# Patient Record
Sex: Female | Born: 1935 | Race: White | Hispanic: No | Smoking: Never smoker
Health system: Southern US, Community
[De-identification: ages and names within clinical notes are randomized; demographics above are authoritative.]

## PROBLEM LIST (undated history)

## (undated) DIAGNOSIS — Z8719 Personal history of other diseases of the digestive system: Secondary | ICD-10-CM

## (undated) DIAGNOSIS — K047 Periapical abscess without sinus: Secondary | ICD-10-CM

## (undated) DIAGNOSIS — I10 Essential (primary) hypertension: Secondary | ICD-10-CM

## (undated) DIAGNOSIS — M810 Age-related osteoporosis without current pathological fracture: Secondary | ICD-10-CM

## (undated) DIAGNOSIS — F419 Anxiety disorder, unspecified: Secondary | ICD-10-CM

## (undated) DIAGNOSIS — F5104 Psychophysiologic insomnia: Secondary | ICD-10-CM

## (undated) DIAGNOSIS — E785 Hyperlipidemia, unspecified: Secondary | ICD-10-CM

## (undated) DIAGNOSIS — M1990 Unspecified osteoarthritis, unspecified site: Secondary | ICD-10-CM

## (undated) DIAGNOSIS — E119 Type 2 diabetes mellitus without complications: Secondary | ICD-10-CM

## (undated) DIAGNOSIS — Z87442 Personal history of urinary calculi: Secondary | ICD-10-CM

## (undated) DIAGNOSIS — R32 Unspecified urinary incontinence: Secondary | ICD-10-CM

## (undated) HISTORY — DX: Essential (primary) hypertension: I10

## (undated) HISTORY — DX: Unspecified urinary incontinence: R32

## (undated) HISTORY — DX: Hyperlipidemia, unspecified: E78.5

## (undated) HISTORY — PX: CYSTOSCOPY: SUR368

## (undated) HISTORY — PX: CHOLECYSTECTOMY: SHX55

## (undated) HISTORY — DX: Anxiety disorder, unspecified: F41.9

## (undated) HISTORY — DX: Psychophysiologic insomnia: F51.04

## (undated) HISTORY — DX: Personal history of other diseases of the digestive system: Z87.19

## (undated) HISTORY — DX: Type 2 diabetes mellitus without complications: E11.9

## (undated) HISTORY — DX: Unspecified osteoarthritis, unspecified site: M19.90

---

## 1999-02-12 ENCOUNTER — Encounter: Payer: Self-pay | Admitting: Neurosurgery

## 1999-02-12 ENCOUNTER — Ambulatory Visit (HOSPITAL_COMMUNITY): Admission: RE | Admit: 1999-02-12 | Discharge: 1999-02-12 | Payer: Self-pay | Admitting: Neurosurgery

## 1999-02-26 ENCOUNTER — Encounter: Payer: Self-pay | Admitting: Neurosurgery

## 1999-02-26 ENCOUNTER — Ambulatory Visit (HOSPITAL_COMMUNITY): Admission: RE | Admit: 1999-02-26 | Discharge: 1999-02-26 | Payer: Self-pay | Admitting: Neurosurgery

## 1999-03-12 ENCOUNTER — Encounter: Payer: Self-pay | Admitting: Neurosurgery

## 1999-03-12 ENCOUNTER — Ambulatory Visit (HOSPITAL_COMMUNITY): Admission: RE | Admit: 1999-03-12 | Discharge: 1999-03-12 | Payer: Self-pay | Admitting: Neurosurgery

## 1999-03-28 ENCOUNTER — Ambulatory Visit (HOSPITAL_COMMUNITY): Admission: RE | Admit: 1999-03-28 | Discharge: 1999-03-28 | Payer: Self-pay | Admitting: Neurosurgery

## 1999-03-28 ENCOUNTER — Encounter: Payer: Self-pay | Admitting: Neurosurgery

## 2002-10-20 ENCOUNTER — Encounter: Payer: Self-pay | Admitting: Neurosurgery

## 2002-10-20 ENCOUNTER — Encounter: Admission: RE | Admit: 2002-10-20 | Discharge: 2002-10-20 | Payer: Self-pay | Admitting: Neurosurgery

## 2002-11-03 ENCOUNTER — Encounter: Admission: RE | Admit: 2002-11-03 | Discharge: 2002-11-03 | Payer: Self-pay | Admitting: Neurosurgery

## 2002-11-03 ENCOUNTER — Encounter: Payer: Self-pay | Admitting: Neurosurgery

## 2002-11-17 ENCOUNTER — Encounter: Payer: Self-pay | Admitting: Neurosurgery

## 2002-11-17 ENCOUNTER — Encounter: Admission: RE | Admit: 2002-11-17 | Discharge: 2002-11-17 | Payer: Self-pay | Admitting: Neurosurgery

## 2010-03-30 HISTORY — PX: KNEE SURGERY: SHX244

## 2010-04-09 ENCOUNTER — Ambulatory Visit: Admission: RE | Admit: 2010-04-09 | Discharge: 2010-04-09 | Payer: Self-pay | Admitting: Orthopedic Surgery

## 2010-04-09 ENCOUNTER — Encounter (INDEPENDENT_AMBULATORY_CARE_PROVIDER_SITE_OTHER): Payer: Self-pay | Admitting: Orthopedic Surgery

## 2010-04-09 ENCOUNTER — Ambulatory Visit: Payer: Self-pay | Admitting: Vascular Surgery

## 2010-04-16 ENCOUNTER — Inpatient Hospital Stay (HOSPITAL_COMMUNITY): Admission: EM | Admit: 2010-04-16 | Discharge: 2010-04-20 | Payer: Self-pay | Admitting: Emergency Medicine

## 2010-04-16 ENCOUNTER — Encounter (INDEPENDENT_AMBULATORY_CARE_PROVIDER_SITE_OTHER): Payer: Self-pay | Admitting: Emergency Medicine

## 2010-12-06 ENCOUNTER — Ambulatory Visit
Admission: RE | Admit: 2010-12-06 | Discharge: 2010-12-06 | Payer: Self-pay | Source: Home / Self Care | Admitting: Orthopedic Surgery

## 2010-12-06 ENCOUNTER — Encounter (INDEPENDENT_AMBULATORY_CARE_PROVIDER_SITE_OTHER): Payer: Self-pay | Admitting: Orthopedic Surgery

## 2011-03-19 LAB — CBC
HCT: 31.9 % — ABNORMAL LOW (ref 36.0–46.0)
HCT: 34.2 % — ABNORMAL LOW (ref 36.0–46.0)
Hemoglobin: 11.1 g/dL — ABNORMAL LOW (ref 12.0–15.0)
Hemoglobin: 12 g/dL (ref 12.0–15.0)
MCHC: 35.1 g/dL (ref 30.0–36.0)
MCV: 91.3 fL (ref 78.0–100.0)
RBC: 3.49 MIL/uL — ABNORMAL LOW (ref 3.87–5.11)
RDW: 12.7 % (ref 11.5–15.5)
WBC: 9.3 10*3/uL (ref 4.0–10.5)

## 2011-03-19 LAB — URINALYSIS, ROUTINE W REFLEX MICROSCOPIC
Bilirubin Urine: NEGATIVE
Nitrite: NEGATIVE
Specific Gravity, Urine: 1.004 — ABNORMAL LOW (ref 1.005–1.030)
Urobilinogen, UA: 0.2 mg/dL (ref 0.0–1.0)
pH: 6.5 (ref 5.0–8.0)

## 2011-03-19 LAB — COMPREHENSIVE METABOLIC PANEL
ALT: 73 U/L — ABNORMAL HIGH (ref 0–35)
CO2: 29 mEq/L (ref 19–32)
Calcium: 9.4 mg/dL (ref 8.4–10.5)
Chloride: 103 mEq/L (ref 96–112)
Creatinine, Ser: 0.67 mg/dL (ref 0.4–1.2)
GFR calc non Af Amer: >60 mL/min (ref >60)
Glucose, Bld: 113 mg/dL — ABNORMAL HIGH (ref 70–99)
Sodium: 138 mEq/L (ref 135–145)
Total Bilirubin: 0.6 mg/dL (ref 0.3–1.2)

## 2011-03-19 LAB — GLUCOSE, CAPILLARY
Glucose-Capillary: 119 mg/dL — ABNORMAL HIGH (ref 70–99)
Glucose-Capillary: 142 mg/dL — ABNORMAL HIGH (ref 70–99)

## 2011-03-19 LAB — URINE CULTURE: Colony Count: 50000

## 2011-03-19 LAB — DIFFERENTIAL
Basophils Absolute: 0 10*3/uL (ref 0.0–0.1)
Basophils Relative: 0 % (ref 0–1)
Eosinophils Relative: 1 % (ref 0–5)
Monocytes Absolute: 0.5 10*3/uL (ref 0.1–1.0)

## 2011-03-19 LAB — CULTURE, BLOOD (ROUTINE X 2)

## 2011-03-19 LAB — PROTIME-INR
INR: 2.08 — ABNORMAL HIGH (ref 0.00–1.49)
INR: 2.23 — ABNORMAL HIGH (ref 0.00–1.49)
INR: 2.61 — ABNORMAL HIGH (ref 0.00–1.49)
Prothrombin Time: 20.9 seconds — ABNORMAL HIGH (ref 11.6–15.2)
Prothrombin Time: 24.5 seconds — ABNORMAL HIGH (ref 11.6–15.2)
Prothrombin Time: 27.7 seconds — ABNORMAL HIGH (ref 11.6–15.2)

## 2013-04-27 ENCOUNTER — Ambulatory Visit (INDEPENDENT_AMBULATORY_CARE_PROVIDER_SITE_OTHER): Payer: Medicare Other | Admitting: Diagnostic Neuroimaging

## 2013-04-27 ENCOUNTER — Encounter: Payer: Self-pay | Admitting: Diagnostic Neuroimaging

## 2013-04-27 VITALS — BP 135/69 | HR 58 | Temp 97.5°F | Ht 64.5 in | Wt 145.0 lb

## 2013-04-27 DIAGNOSIS — G20 Parkinson's disease: Secondary | ICD-10-CM

## 2013-04-27 DIAGNOSIS — R251 Tremor, unspecified: Secondary | ICD-10-CM

## 2013-04-27 DIAGNOSIS — R259 Unspecified abnormal involuntary movements: Secondary | ICD-10-CM

## 2013-04-27 MED ORDER — CARBIDOPA-LEVODOPA 25-100 MG PO TABS: 1.0000 | ORAL_TABLET | Freq: Three times a day (TID) | ORAL | Status: DC

## 2013-04-27 NOTE — Patient Instructions (Addendum)
Start carbidopa/levodopa (25/100) half tab three times per day x 2 weeks; then increase to 1 tab 3 times per day (30 minutes before meals).  Consider physical therapy (outpatient or home PT).

## 2013-04-27 NOTE — Progress Notes (Signed)
[facility]  PATIENT: Kim Nichols DOB: Jul 20, 1929  REFERRING CLINICIAN: Arther Dames HISTORY FROM: patient  REASON FOR VISIT: new consult   HISTORICAL  CHIEF COMPLAINT:  Chief Complaint  Patient presents with  . Tremors    NP...6     HISTORY OF PRESENT ILLNESS:   79 year old right-handed female here for evaluation of tremor. Patient reports resting greater than postural tremor, mainly in her right upper extremity. Symptoms worse anxiety and stress. Jos is 1-2 year history of balance difficulty. She has noticed some decreased in taste ability over the years. No sleep disturbances, hallucination, constipation or nausea. She has had some head shaking over the past 3 months. No family history tremor.  REVIEW OF SYSTEMS: Full 14 system review of systems performed and notable only for tremor joint pain ringing in ears.  ALLERGIES: Allergies  Allergen Reactions  . Aleve (Naproxen Sodium) Itching    HOME MEDICATIONS: No outpatient prescriptions prior to visit.   No facility-administered medications prior to visit.    PAST MEDICAL HISTORY: Past Medical History  Diagnosis Date  . Hypertension   . Diabetes mellitus without complication   . Dyslipidemia   . History of gastroesophageal reflux (GERD)   . Osteoarthritis   . Anxiety disorder   . Chronic insomnia   . Incontinence of urine     PAST SURGICAL HISTORY: Past Surgical History  Procedure Laterality Date  . Knee surgery  4-11    FAMILY HISTORY: No family history on file.  SOCIAL HISTORY:  History   Social History  . Marital Status: Widowed    Spouse Name: N/A    Number of Children: N/A  . Years of Education: N/A   Occupational History  . Not on file.   Social History Main Topics  . Smoking status: Never Smoker   . Smokeless tobacco: Never Used  . Alcohol Use: No  . Drug Use: No  . Sexually Active: Not on file   Other Topics Concern  . Not on file   Social History Narrative    . No narrative on file     PHYSICAL EXAM  Filed Vitals:   04/27/13 1022  BP: 135/69  Pulse: 58  Temp: 97.5 F (36.4 C)  TempSrc: Oral  Height: 5' 4.5" (1.638 m)  Weight: 145 lb (65.772 kg)   Body mass index is 24.51 kg/(m^2).  GENERAL EXAM: Patient is in no distress  CARDIOVASCULAR: Regular rate and rhythm, no murmurs, no carotid bruits  NEUROLOGIC: MENTAL STATUS: awake, alert, language fluent, comprehension intact, naming intact; NO FRONTAL RELEASE SIGNS. CRANIAL NERVE: no papilledema on fundoscopic exam, pupils equal and reactive to light, visual fields full to confrontation, extraocular muscles intact, no nystagmus, facial sensation and strength symmetric, uvula midline, shoulder shrug symmetric, tongue midline; MILD HEAD AND MOUTH TREMOR. MOTOR: MILD COGWHEELING IN L > R UE. INT REST TREMOR OF RUE. MILD BRADYKINESIA IN BUE. Full strength in the BUE, BLE; MILD POSTURAL TREMOR OF BUE. SENSORY: VIB 7 SECONDS IN TOES COORDINATION: finger-nose-finger, fine finger movements MILD ACTION TREMOR. REFLEXES: deep tendon reflexes present and symmetric GAIT/STATION: RIGHT LEG DEVIATES MEDIALLY AT KNEE, WITH LIMPING GAIT. STOOPED POSTURE.   DIAGNOSTIC DATA (LABS, IMAGING, TESTING) - I reviewed patient records, labs, notes, testing and imaging myself where available.  Lab Results  Component Value Date   WBC 9.3 04/17/2010   HGB 11.1* 04/17/2010   HCT 31.9* 04/17/2010   MCV 91.3 04/17/2010   PLT 235 04/17/2010      Component  Value Date/Time   NA 138 04/16/2010 1359   K 4.5 04/16/2010 1359   CL 103 04/16/2010 1359   CO2 29 04/16/2010 1359   GLUCOSE 113* 04/16/2010 1359   BUN 8 04/16/2010 1359   CREATININE 0.67 04/16/2010 1359   CALCIUM 9.4 04/16/2010 1359   PROT 7.2 04/16/2010 1359   ALBUMIN 3.9 04/16/2010 1359   AST 70* 04/16/2010 1359   ALT 73* 04/16/2010 1359   ALKPHOS 60 04/16/2010 1359   BILITOT 0.6 04/16/2010 1359   GFRNONAA >60 04/16/2010 1359   GFRAA  Value: >60        The eGFR  has been calculated using the MDRD equation. This calculation has not been validated in all clinical situations. eGFR's persistently <60 mL/min signify possible Chronic Kidney Disease. 04/16/2010 1359   No results found for this basename: CHOL, HDL, LDLCALC, LDLDIRECT, TRIG, CHOLHDL   No results found for this basename: HGBA1C   No results found for this basename: VITAMINB12   No results found for this basename: TSH      ASSESSMENT AND PLAN  79 y.o. year old female  has a past medical history of Hypertension; Diabetes mellitus without complication; Dyslipidemia; History of gastroesophageal reflux (GERD); Osteoarthritis; Anxiety disorder; Chronic insomnia; and Incontinence of urine. here with progressive resting greater than postural tremor. Patient has some mild cogwheel rigidity, bradykinesia and postural instability. Suspect idiopathic Parkinson's disease. We'll check MRI brain, TSH, as well as start carbidopa/levodopa trial.   Orders Placed This Encounter  Procedures  . MR Brain Wo Contrast  . TSH    Suanne Marker, MD 04/27/2013, 11:41 AM Certified in Neurology, Neurophysiology and Neuroimaging  [facility] [address] [place], [place] [address] [phone]

## 2013-05-03 NOTE — Progress Notes (Signed)
Quick Note:  Left a message on the pt's home voice mail (vm was in the patient's voice and she stated her name) regarding her recent labs being within normal limits. Contact information was given so that she may call with any questions or concerns.   ______ 

## 2013-05-07 ENCOUNTER — Ambulatory Visit
Admission: RE | Admit: 2013-05-07 | Discharge: 2013-05-07 | Disposition: A | Discharge status: Home / Self Care | Payer: Medicare Other | Source: Ambulatory Visit | Attending: Diagnostic Neuroimaging | Admitting: Diagnostic Neuroimaging

## 2013-05-07 DIAGNOSIS — R251 Tremor, unspecified: Secondary | ICD-10-CM

## 2013-05-07 DIAGNOSIS — R259 Unspecified abnormal involuntary movements: Secondary | ICD-10-CM

## 2013-05-07 DIAGNOSIS — G20 Parkinson's disease: Secondary | ICD-10-CM

## 2013-05-30 HISTORY — PX: OTHER SURGICAL HISTORY: SHX169

## 2013-06-21 ENCOUNTER — Inpatient Hospital Stay (HOSPITAL_COMMUNITY)
Admission: AD | Admit: 2013-06-21 | Discharge: 2013-06-25 | DRG: 070 | Disposition: A | Discharge status: Rehabilitation Facility | Payer: Medicare Other | Source: Other Acute Inpatient Hospital | Attending: Internal Medicine | Admitting: Internal Medicine

## 2013-06-21 ENCOUNTER — Inpatient Hospital Stay
Admission: AD | Admit: 2013-06-21 | Payer: Self-pay | Source: Other Acute Inpatient Hospital | Admitting: Internal Medicine

## 2013-06-21 ENCOUNTER — Inpatient Hospital Stay (HOSPITAL_COMMUNITY): Payer: Medicare Other

## 2013-06-21 ENCOUNTER — Encounter (HOSPITAL_COMMUNITY): Payer: Self-pay | Admitting: Internal Medicine

## 2013-06-21 DIAGNOSIS — E785 Hyperlipidemia, unspecified: Secondary | ICD-10-CM | POA: Diagnosis present

## 2013-06-21 DIAGNOSIS — G20A1 Parkinson's disease without dyskinesia, without mention of fluctuations: Secondary | ICD-10-CM | POA: Diagnosis present

## 2013-06-21 DIAGNOSIS — I82409 Acute embolism and thrombosis of unspecified deep veins of unspecified lower extremity: Secondary | ICD-10-CM | POA: Diagnosis not present

## 2013-06-21 DIAGNOSIS — D62 Acute posthemorrhagic anemia: Secondary | ICD-10-CM | POA: Diagnosis present

## 2013-06-21 DIAGNOSIS — F329 Major depressive disorder, single episode, unspecified: Secondary | ICD-10-CM | POA: Diagnosis present

## 2013-06-21 DIAGNOSIS — G20 Parkinson's disease: Secondary | ICD-10-CM | POA: Diagnosis present

## 2013-06-21 DIAGNOSIS — G9340 Encephalopathy, unspecified: Principal | ICD-10-CM | POA: Diagnosis present

## 2013-06-21 DIAGNOSIS — Z79899 Other long term (current) drug therapy: Secondary | ICD-10-CM

## 2013-06-21 DIAGNOSIS — R410 Disorientation, unspecified: Secondary | ICD-10-CM

## 2013-06-21 DIAGNOSIS — B0229 Other postherpetic nervous system involvement: Secondary | ICD-10-CM | POA: Diagnosis present

## 2013-06-21 DIAGNOSIS — D649 Anemia, unspecified: Secondary | ICD-10-CM

## 2013-06-21 DIAGNOSIS — R404 Transient alteration of awareness: Secondary | ICD-10-CM

## 2013-06-21 DIAGNOSIS — M1990 Unspecified osteoarthritis, unspecified site: Secondary | ICD-10-CM | POA: Diagnosis present

## 2013-06-21 DIAGNOSIS — E119 Type 2 diabetes mellitus without complications: Secondary | ICD-10-CM | POA: Diagnosis present

## 2013-06-21 DIAGNOSIS — J189 Pneumonia, unspecified organism: Secondary | ICD-10-CM

## 2013-06-21 DIAGNOSIS — S72009D Fracture of unspecified part of neck of unspecified femur, subsequent encounter for closed fracture with routine healing: Secondary | ICD-10-CM

## 2013-06-21 DIAGNOSIS — I82401 Acute embolism and thrombosis of unspecified deep veins of right lower extremity: Secondary | ICD-10-CM

## 2013-06-21 DIAGNOSIS — I10 Essential (primary) hypertension: Secondary | ICD-10-CM | POA: Diagnosis present

## 2013-06-21 DIAGNOSIS — J9600 Acute respiratory failure, unspecified whether with hypoxia or hypercapnia: Secondary | ICD-10-CM | POA: Diagnosis present

## 2013-06-21 DIAGNOSIS — E876 Hypokalemia: Secondary | ICD-10-CM | POA: Diagnosis not present

## 2013-06-21 DIAGNOSIS — F411 Generalized anxiety disorder: Secondary | ICD-10-CM | POA: Diagnosis present

## 2013-06-21 LAB — URINALYSIS, ROUTINE W REFLEX MICROSCOPIC
Bilirubin Urine: NEGATIVE
Nitrite: NEGATIVE
Specific Gravity, Urine: 1.028 (ref 1.005–1.030)
pH: 6 (ref 5.0–8.0)

## 2013-06-21 LAB — RAPID URINE DRUG SCREEN, HOSP PERFORMED
Amphetamines: NOT DETECTED
Barbiturates: NOT DETECTED
Opiates: POSITIVE — AB
Tetrahydrocannabinol: NOT DETECTED

## 2013-06-21 LAB — URINE MICROSCOPIC-ADD ON

## 2013-06-21 LAB — BLOOD GAS, ARTERIAL
Bicarbonate: 30.6 mEq/L — ABNORMAL HIGH (ref 20.0–24.0)
Drawn by: 36527
Patient temperature: 98.6
pH, Arterial: 7.383 (ref 7.350–7.450)

## 2013-06-21 LAB — MRSA PCR SCREENING: MRSA by PCR: NEGATIVE

## 2013-06-21 MED ORDER — ACETAMINOPHEN 650 MG RE SUPP: 650.0000 mg | Freq: Four times a day (QID) | RECTAL | Status: DC | PRN

## 2013-06-21 MED ORDER — PIPERACILLIN-TAZOBACTAM 3.375 G IVPB
3.3750 g | Freq: Three times a day (TID) | INTRAVENOUS | Status: DC
  Administered 2013-06-22 (×2): 3.375 g via INTRAVENOUS
  Filled 2013-06-21 (×4): qty 50

## 2013-06-21 MED ORDER — SODIUM CHLORIDE 0.9 % IJ SOLN
3.0000 mL | Freq: Two times a day (BID) | INTRAMUSCULAR | Status: DC
  Administered 2013-06-22 – 2013-06-25 (×5): 3 mL via INTRAVENOUS

## 2013-06-21 MED ORDER — SODIUM CHLORIDE 0.9 % IV SOLN
INTRAVENOUS | Status: DC
  Administered 2013-06-22: via INTRAVENOUS

## 2013-06-21 MED ORDER — ONDANSETRON HCL 4 MG PO TABS: 4.0000 mg | ORAL_TABLET | Freq: Four times a day (QID) | ORAL | Status: DC | PRN

## 2013-06-21 MED ORDER — DEXMEDETOMIDINE HCL IN NACL 200 MCG/50ML IV SOLN
0.2000 ug/kg/h | INTRAVENOUS | Status: DC
Start: 2013-06-22 — End: 2013-06-22
  Administered 2013-06-22: 0.2 ug/kg/h via INTRAVENOUS
  Administered 2013-06-22: 0.8 ug/kg/h via INTRAVENOUS
  Filled 2013-06-21 (×3): qty 50

## 2013-06-21 MED ORDER — THIAMINE HCL 100 MG/ML IJ SOLN
100.0000 mg | Freq: Every day | INTRAMUSCULAR | Status: DC
  Administered 2013-06-22 – 2013-06-23 (×2): 100 mg via INTRAVENOUS
  Filled 2013-06-21 (×2): qty 1

## 2013-06-21 MED ORDER — HYDRALAZINE HCL 20 MG/ML IJ SOLN
10.0000 mg | INTRAMUSCULAR | Status: DC | PRN
  Administered 2013-06-23: 10 mg via INTRAVENOUS
  Filled 2013-06-21: qty 1

## 2013-06-21 MED ORDER — CARBIDOPA-LEVODOPA 25-100 MG PO TABS
1.0000 | ORAL_TABLET | Freq: Three times a day (TID) | ORAL | Status: DC
  Administered 2013-06-22 – 2013-06-23 (×2): 1 via ORAL
  Filled 2013-06-21 (×7): qty 1

## 2013-06-21 MED ORDER — INSULIN ASPART 100 UNIT/ML ~~LOC~~ SOLN: 0.0000 [IU] | SUBCUTANEOUS | Status: DC

## 2013-06-21 MED ORDER — CLONIDINE HCL 0.1 MG/24HR TD PTWK
0.1000 mg | MEDICATED_PATCH | TRANSDERMAL | Status: DC
  Filled 2013-06-21: qty 1

## 2013-06-21 MED ORDER — INSULIN ASPART 100 UNIT/ML ~~LOC~~ SOLN: 0.0000 [IU] | Freq: Three times a day (TID) | SUBCUTANEOUS | Status: DC

## 2013-06-21 MED ORDER — LORAZEPAM 2 MG/ML IJ SOLN: 0.2500 mg | Freq: Once | INTRAMUSCULAR | Status: DC

## 2013-06-21 MED ORDER — LORAZEPAM 2 MG/ML IJ SOLN
INTRAMUSCULAR | Status: AC
  Administered 2013-06-21: 0.25 mg
  Filled 2013-06-21: qty 1

## 2013-06-21 MED ORDER — ONDANSETRON HCL 4 MG/2ML IJ SOLN: 4.0000 mg | Freq: Four times a day (QID) | INTRAMUSCULAR | Status: DC | PRN

## 2013-06-21 MED ORDER — HALOPERIDOL LACTATE 5 MG/ML IJ SOLN
INTRAMUSCULAR | Status: AC
  Administered 2013-06-21: 5 mg via INTRAVENOUS
  Filled 2013-06-21: qty 2

## 2013-06-21 MED ORDER — HALOPERIDOL LACTATE 5 MG/ML IJ SOLN: 5.0000 mg | Freq: Once | INTRAMUSCULAR | Status: AC

## 2013-06-21 MED ORDER — ACETAMINOPHEN 325 MG PO TABS
650.0000 mg | ORAL_TABLET | Freq: Four times a day (QID) | ORAL | Status: DC | PRN
  Administered 2013-06-22 – 2013-06-24 (×4): 650 mg via ORAL
  Filled 2013-06-21 (×4): qty 2

## 2013-06-21 MED ORDER — VANCOMYCIN HCL 500 MG IV SOLR
500.0000 mg | Freq: Two times a day (BID) | INTRAVENOUS | Status: DC
  Administered 2013-06-22: 500 mg via INTRAVENOUS
  Filled 2013-06-21 (×2): qty 500

## 2013-06-21 NOTE — Progress Notes (Addendum)
ANTIBIOTIC CONSULT NOTE - INITIAL  Pharmacy Consult for Vancocin, Levaquin, and Zosyn Indication: rule out pneumonia  Allergies  Allergen Reactions  . Aleve (Naproxen Sodium) Itching    Patient Measurements: Height: 5\' 4"  (162.6 cm) Weight: 151 lb 0.2 oz (68.5 kg) IBW/kg (Calculated) : 54.7   Microbiology: Recent Results (from the past 720 hour(s))  MRSA PCR SCREENING     Status: None   Collection Time    06/21/13  8:53 PM      Result Value Range Status   MRSA by PCR NEGATIVE  NEGATIVE Final   Comment:            The GeneXpert MRSA Assay (FDA     approved for NASAL specimens     only), is one component of a     comprehensive MRSA colonization     surveillance program. It is not     intended to diagnose MRSA     infection nor to guide or     monitor treatment for     MRSA infections.    Medical History: Past Medical History  Diagnosis Date  . Hypertension   . Diabetes mellitus without complication   . Dyslipidemia   . History of gastroesophageal reflux (GERD)   . Osteoarthritis   . Anxiety disorder   . Chronic insomnia   . Incontinence of urine     Medications:  Prescriptions prior to admission  Medication Sig Dispense Refill  . carbidopa-levodopa (SINEMET IR) 25-100 MG per tablet Take 1 tablet by mouth 3 (three) times daily.  60 tablet  2  . cloNIDine (CATAPRES) 0.1 MG tablet Take 0.1 mg by mouth at bedtime.       . DULoxetine (CYMBALTA) 60 MG capsule Take 60 mg by mouth daily.       . enalapril (VASOTEC) 20 MG tablet Take 20 mg by mouth daily.       Marland Kitchen esomeprazole (NEXIUM) 40 MG capsule Take 40 mg by mouth daily before breakfast.      . felodipine (PLENDIL) 5 MG 24 hr tablet Take 5 mg by mouth daily.       Marland Kitchen gabapentin (NEURONTIN) 300 MG capsule Take 300 mg by mouth 3 (three) times daily.       Marland Kitchen HYDROcodone-acetaminophen (NORCO) 7.5-325 MG per tablet Take 1 tablet by mouth daily as needed for pain.       Marland Kitchen LORazepam (ATIVAN) 1 MG tablet Take 1 mg by mouth  2 (two) times daily.       . Multiple Vitamin (MULTIVITAMIN WITH MINERALS) TABS Take 1 tablet by mouth daily. Vision Formula with Lutein      . simvastatin (ZOCOR) 40 MG tablet Take 40 mg by mouth at bedtime.       . Calcium Carbonate-Vitamin D (CALCIUM 600 + D PO) Take 600 mg by mouth once. Take 2 tabs once a day      . zolpidem (AMBIEN) 10 MG tablet Take 10 mg by mouth at bedtime.        Scheduled:  . carbidopa-levodopa  1 tablet Oral TID  . cloNIDine  0.1 mg Transdermal Weekly  . [START ON 06/22/2013] insulin aspart  0-9 Units Subcutaneous TID WC  . sodium chloride  3 mL Intravenous Q12H  . [START ON 06/22/2013] thiamine  100 mg Intravenous Daily   Infusions:  . sodium chloride      Assessment: 79yo female transferred to [facility] for acute delirium and hypoxemia, likely to be intubated, to begin IV  ABX for suspected PNA.  SCr at OSH = 0.67, est CrCl ~37ml/min.  Goal of Therapy:  Vancomycin trough level 15-20 mcg/ml  Plan:  Rec'd vancomycin 1250mg  and Zosyn 3.375g IV at 1800 this pm at OSH; will continue with vanc 500mg  IV Q12H and Zosyn 3.375g IV Q8H as well as Levaquin 500mg  IV Q24H and monitor CBC, Cx, levels prn.  Vernard Gambles, PharmD, BCPS  06/21/2013,11:18 PM

## 2013-06-21 NOTE — Progress Notes (Signed)
eLink Physician-Brief Progress Note Patient Name: Kim Nichols DOB: [birth year] MRN: [id number]  Date of Service  06/21/2013   HPI/Events of Note   Pt with acute delirium and hypoxemia. Prior hip fx repair  eICU Interventions  PCCM will consult, full note to follow Give haldol, Likely will need intubation.   Intervention Category Major Interventions: Electrolyte abnormality - evaluation and management;Respiratory failure - evaluation and management  Shan Levans 06/21/2013, 10:51 PM

## 2013-06-21 NOTE — H&P (Signed)
[facility] History and Physical  Kim Nichols ZOX:[id number] DOB: 1929/09/04 DOA: 06/21/2013  Referring physician:     PCP: Georgann Housekeeper, MD  History obtained from patient's daughter and medical records.  Chief Complaint: Confusion and hypoxia.  HPI: Kim Nichols is a 79 y.o. female who has had a recent right hip surgery 4 right subcapital femur fracture at [facility] hospital was transferred to [facility] at the request of family as they were not happy with the care at [facility]. As per patient's daughter patient had a fall at her house and the patient had called patient's daughter saying that she had a fall and was taken to [facility] on June 17, 2013. On June 19, 2013 patient underwent right percutaneous pinning for right subcapital femur fracture. As per patient's daughter the surgery was performed under spinal anesthesia. After the surgery patient has become confused and was also noticed to be febrile. Patient was mildly febrile even prior to the surgery and records so patient was on ceftriaxone probably secondary to UTI. Chest x-ray done after patient had surgery showed possible infiltrates and was placed on Zosyn for possible aspiration. ABG surgically done the next day showed a pH of 7.39 carbon dioxide of 60 and oxygen of 53. Patient's encephalopathy was also attributed to possible narcotics. Since patient's mental status and hypoxia did not improve family requested transfer to [facility]. On exam patient is completely confused and agitated. She is moving all extremities. She is requiring almost 100% oxygen. Per daughter patient was not taking anything orally last 2 days because of swallowing difficulties. Patient has been on Ativan, Neurontin, Cymbalta along with other medications. Patient was recently started on Sinemet for movement disorder and had a recent MRI of the brain done by neurologist as outpatient. CT of the head was done at almost a hospital  report showed nothing acute. Patient's last lab done on June 23 24 show sodium of 139 potassium 3.9 bicarbonate 32 creatinine of 0.6 glucose of 117 hemoglobin A1c of 6.4, RPR nonreactive AST 22 ALT 25 alkaline phosphatase 56 total bilirubin 0.4 hemoglobin 9.9 platelets 167 WBC 8, B12 438.  Review of Systems: As presented in the history of presenting illness, rest negative.  Past Medical History  Diagnosis Date  . Hypertension   . Diabetes mellitus without complication   . Dyslipidemia   . History of gastroesophageal reflux (GERD)   . Osteoarthritis   . Anxiety disorder   . Chronic insomnia   . Incontinence of urine    Past Surgical History  Procedure Laterality Date  . Knee surgery  4-11   Social History:  reports that she has never smoked. She has never used smokeless tobacco. She reports that she does not drink alcohol or use illicit drugs. Home. where does patient live-- Usually does her ADLs. Can patient participate in ADLs?  Allergies  Allergen Reactions  . Aleve (Naproxen Sodium) Itching    History reviewed. No pertinent family history. pacemaker in her mom.   Prior to Admission medications   Medication Sig Start Date End Date Taking? Authorizing Provider  carbidopa-levodopa (SINEMET IR) 25-100 MG per tablet Take 1 tablet by mouth 3 (three) times daily. 04/27/13  Yes Suanne Marker, MD  cloNIDine (CATAPRES) 0.1 MG tablet Take 0.1 mg by mouth at bedtime.  03/22/13  Yes Historical Provider, MD  DULoxetine (CYMBALTA) 60 MG capsule Take 60 mg by mouth daily.  04/16/13  Yes Historical Provider, MD  enalapril (VASOTEC) 20 MG tablet Take 20  mg by mouth daily.  04/16/13  Yes Historical Provider, MD  esomeprazole (NEXIUM) 40 MG capsule Take 40 mg by mouth daily before breakfast.   Yes Historical Provider, MD  felodipine (PLENDIL) 5 MG 24 hr tablet Take 5 mg by mouth daily.  03/22/13  Yes Historical Provider, MD  gabapentin (NEURONTIN) 300 MG capsule Take 300 mg by mouth 3 (three)  times daily.  03/18/13  Yes Historical Provider, MD  HYDROcodone-acetaminophen (NORCO) 7.5-325 MG per tablet Take 1 tablet by mouth daily as needed for pain.  03/25/13  Yes Historical Provider, MD  LORazepam (ATIVAN) 1 MG tablet Take 1 mg by mouth 2 (two) times daily.  03/22/13  Yes Historical Provider, MD  Multiple Vitamin (MULTIVITAMIN WITH MINERALS) TABS Take 1 tablet by mouth daily. Vision Formula with Lutein   Yes Historical Provider, MD  simvastatin (ZOCOR) 40 MG tablet Take 40 mg by mouth at bedtime.  04/16/13  Yes Historical Provider, MD  Calcium Carbonate-Vitamin D (CALCIUM 600 + D PO) Take 600 mg by mouth once. Take 2 tabs once a day    Historical Provider, MD  zolpidem (AMBIEN) 10 MG tablet Take 10 mg by mouth at bedtime.  03/12/13   Historical Provider, MD   Physical Exam: There were no vitals filed for this visit.   General:  Well-developed and nourished.  Eyes: Anicteric no pallor.  ENT: No discharge from ears eyes nose mouth.  Neck: No mass felt. No neck rigidity.  Cardiovascular: S1-S2 heard.  Respiratory: No rhonchi or crepitations.  Abdomen: Soft nontender bowel sounds present.  Skin: Recent surgical sutures seen.  Musculoskeletal: Recent surgical sutures seen. No edema.  Psychiatric: Patient is confused.  Neurologic: Patient is confused and agitated. Moves all extremities.  Labs on Admission:  Basic Metabolic Panel: No results found for this basename: NA, K, CL, CO2, GLUCOSE, BUN, CREATININE, CALCIUM, MG, PHOS,  in the last 168 hours Liver Function Tests: No results found for this basename: AST, ALT, ALKPHOS, BILITOT, PROT, ALBUMIN,  in the last 168 hours No results found for this basename: LIPASE, AMYLASE,  in the last 168 hours No results found for this basename: AMMONIA,  in the last 168 hours CBC: No results found for this basename: WBC, NEUTROABS, HGB, HCT, MCV, PLT,  in the last 168 hours Cardiac Enzymes: No results found for this basename: CKTOTAL,  CKMB, CKMBINDEX, TROPONINI,  in the last 168 hours  BNP (last 3 results) No results found for this basename: PROBNP,  in the last 8760 hours CBG: No results found for this basename: GLUCAP,  in the last 168 hours  Radiological Exams on Admission: No results found.   Assessment/Plan Principal Problem:   Acute encephalopathy Active Problems:   HTN (hypertension)   Hyperlipidemia   Anemia   Pneumonia   1. Acute encephalopathy - could be multifactorial probably secondary to medications/acute respiratory failure/metabolic reasons. At this time I have reordered complete metabolic panel, CBC with differential, TSH, blood culture, UA urine culture, urine drug screen, ABG, chest x-ray and ammonia level. Since patient is quite agitated at this time and patient has been on Ativan on a regular dosing and will try a small dose of Ativan 0.25 mg IV once. 2. Acute respiratory failure - check stat chest x-rays and ABG. Acute respiratory failure probably secondary to aspiration pneumonia/ARDS. Check for any carbon dioxide retention in ABG. Patient has been placed on empiric antibiotics. 3. Anemia - probably secondary blood loss. 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 This has been confirmed by patient's daughter. 4. Hypertension - since patient is going to be kept n.p.o. I place patient on Coumadin patch and when necessary IV hydralazine for systolic blood pressure more than 160. 5. Recently placed on Sinemet - continue if patient can take it orally. I think patient's fever is from aspiration and possible UTI and not from Sinemet being off. 6. Hyperlipidemia - continue statins when patient can take orally. 7. Recent right hip surgery as discussed in history of presenting illness. 8. Hyperglycemia - recent hemoglobin A1c done at [facility] hospital 3 days ago was 6.4.  I have consulted pulmonary critical care for further recommendations.   Code Status:  Full  code.  Family Communication:  Patient's daughter.  Disposition Plan:  Admit to inpatient.    KAKRAKANDY,ARSHAD N. [facility] Pager [phone].  If 7PM-7AM, please contact night-coverage [email or web address] Password [facility] 06/21/2013, 10:25 PM

## 2013-06-22 DIAGNOSIS — I82409 Acute embolism and thrombosis of unspecified deep veins of unspecified lower extremity: Secondary | ICD-10-CM

## 2013-06-22 DIAGNOSIS — G9340 Encephalopathy, unspecified: Secondary | ICD-10-CM

## 2013-06-22 DIAGNOSIS — I369 Nonrheumatic tricuspid valve disorder, unspecified: Secondary | ICD-10-CM

## 2013-06-22 DIAGNOSIS — R410 Disorientation, unspecified: Secondary | ICD-10-CM

## 2013-06-22 DIAGNOSIS — Z86718 Personal history of other venous thrombosis and embolism: Secondary | ICD-10-CM

## 2013-06-22 LAB — URINE CULTURE: Culture: NO GROWTH

## 2013-06-22 LAB — CBC WITH DIFFERENTIAL/PLATELET
Eosinophils Absolute: 0.3 10*3/uL (ref 0.0–0.7)
Eosinophils Relative: 4 % (ref 0–5)
HCT: 26.1 % — ABNORMAL LOW (ref 36.0–46.0)
Hemoglobin: 8.5 g/dL — ABNORMAL LOW (ref 12.0–15.0)
Lymphocytes Relative: 10 % — ABNORMAL LOW (ref 12–46)
Lymphs Abs: 0.6 10*3/uL — ABNORMAL LOW (ref 0.7–4.0)
MCH: 29.6 pg (ref 26.0–34.0)
MCV: 90.9 fL (ref 78.0–100.0)
Monocytes Relative: 8 % (ref 3–12)
RBC: 2.87 MIL/uL — ABNORMAL LOW (ref 3.87–5.11)

## 2013-06-22 LAB — GLUCOSE, CAPILLARY: Glucose-Capillary: 112 mg/dL — ABNORMAL HIGH (ref 70–99)

## 2013-06-22 LAB — MAGNESIUM: Magnesium: 1.8 mg/dL (ref 1.5–2.5)

## 2013-06-22 LAB — COMPREHENSIVE METABOLIC PANEL
AST: 25 U/L (ref 0–37)
Albumin: 2.6 g/dL — ABNORMAL LOW (ref 3.5–5.2)
Calcium: 8.5 mg/dL (ref 8.4–10.5)
Chloride: 102 mEq/L (ref 96–112)
Creatinine, Ser: 0.63 mg/dL (ref 0.50–1.10)
Total Protein: 5.7 g/dL — ABNORMAL LOW (ref 6.0–8.3)

## 2013-06-22 LAB — PROCALCITONIN: Procalcitonin: 0.22 ng/mL

## 2013-06-22 MED ORDER — CLONAZEPAM 0.5 MG PO TABS
0.5000 mg | ORAL_TABLET | Freq: Every day | ORAL | Status: DC
  Administered 2013-06-22 – 2013-06-23 (×2): 0.5 mg via ORAL
  Filled 2013-06-22 (×3): qty 1

## 2013-06-22 MED ORDER — LEVOFLOXACIN IN D5W 500 MG/100ML IV SOLN
500.0000 mg | Freq: Once | INTRAVENOUS | Status: AC
  Administered 2013-06-22: 500 mg via INTRAVENOUS
  Filled 2013-06-22: qty 100

## 2013-06-22 MED ORDER — LEVOFLOXACIN IN D5W 500 MG/100ML IV SOLN
500.0000 mg | INTRAVENOUS | Status: DC
  Filled 2013-06-22: qty 100

## 2013-06-22 MED ORDER — GABAPENTIN 100 MG PO CAPS
200.0000 mg | ORAL_CAPSULE | Freq: Two times a day (BID) | ORAL | Status: DC
  Administered 2013-06-22 – 2013-06-23 (×4): 200 mg via ORAL
  Filled 2013-06-22 (×6): qty 2

## 2013-06-22 MED ORDER — KCL IN DEXTROSE-NACL 40-5-0.45 MEQ/L-%-% IV SOLN
INTRAVENOUS | Status: DC
  Administered 2013-06-22: 13:00:00 via INTRAVENOUS
  Administered 2013-06-24: 10 mL/h via INTRAVENOUS
  Filled 2013-06-22 (×5): qty 1000

## 2013-06-22 MED ORDER — ENOXAPARIN SODIUM 80 MG/0.8ML ~~LOC~~ SOLN
70.0000 mg | Freq: Two times a day (BID) | SUBCUTANEOUS | Status: DC
  Administered 2013-06-22 – 2013-06-23 (×2): 70 mg via SUBCUTANEOUS
  Administered 2013-06-23: 19:00:00 via SUBCUTANEOUS
  Administered 2013-06-24: 70 mg via SUBCUTANEOUS
  Filled 2013-06-22 (×7): qty 0.8

## 2013-06-22 MED ORDER — MORPHINE SULFATE 2 MG/ML IJ SOLN
1.0000 mg | INTRAMUSCULAR | Status: DC | PRN
  Administered 2013-06-22: 2 mg via INTRAVENOUS
  Filled 2013-06-22: qty 1

## 2013-06-22 MED ORDER — CLONIDINE HCL 0.1 MG/24HR TD PTWK
0.1000 mg | MEDICATED_PATCH | TRANSDERMAL | Status: DC
  Administered 2013-06-22: 0.1 mg via TRANSDERMAL
  Filled 2013-06-22: qty 1

## 2013-06-22 MED ORDER — CLONAZEPAM 0.5 MG PO TABS
1.0000 mg | ORAL_TABLET | Freq: Every day | ORAL | Status: DC
  Administered 2013-06-22 – 2013-06-24 (×3): 1 mg via ORAL
  Filled 2013-06-22 (×3): qty 1
  Filled 2013-06-22: qty 2

## 2013-06-22 MED ORDER — FENTANYL 25 MCG/HR TD PT72
25.0000 ug | MEDICATED_PATCH | TRANSDERMAL | Status: DC
  Administered 2013-06-22: 25 ug via TRANSDERMAL
  Filled 2013-06-22 (×2): qty 1

## 2013-06-22 MED ORDER — DEXMEDETOMIDINE HCL IN NACL 200 MCG/50ML IV SOLN
0.4000 ug/kg/h | INTRAVENOUS | Status: AC
  Administered 2013-06-22: 1.2 ug/kg/h via INTRAVENOUS
  Administered 2013-06-22: 2 ug/kg/h via INTRAVENOUS
  Administered 2013-06-22: 0.4 ug/kg/h via INTRAVENOUS
  Administered 2013-06-22: 2 ug/kg/h via INTRAVENOUS
  Administered 2013-06-22: 1 ug/kg/h via INTRAVENOUS
  Filled 2013-06-22 (×5): qty 50

## 2013-06-22 MED ORDER — DULOXETINE HCL 60 MG PO CPEP
60.0000 mg | ORAL_CAPSULE | Freq: Every day | ORAL | Status: DC
  Administered 2013-06-22 – 2013-06-25 (×4): 60 mg via ORAL
  Filled 2013-06-22 (×4): qty 1

## 2013-06-22 NOTE — Progress Notes (Signed)
ANTICOAGULATION CONSULT NOTE - Initial Consult  Pharmacy Consult for Lovenox Indication: DVT  Allergies  Allergen Reactions  . Aleve (Naproxen Sodium) Itching    Patient Measurements: Height: 5\' 4"  (162.6 cm) Weight: 149 lb 7.6 oz (67.8 kg) IBW/kg (Calculated) : 54.7   Vital Signs: Temp: 97.5 F (36.4 C) (06/24 1532) Temp src: Oral (06/24 1532) BP: 162/62 mmHg (06/24 1600) Pulse Rate: 44 (06/24 1600)  Labs:  Recent Labs  06/22/13 0215  HGB 8.5*  HCT 26.1*  PLT 157  CREATININE 0.63    Estimated Creatinine Clearance: 51.3 ml/min (by C-G formula based on Cr of 0.63).   Medical History: Past Medical History  Diagnosis Date  . Hypertension   . Diabetes mellitus without complication   . Dyslipidemia   . History of gastroesophageal reflux (GERD)   . Osteoarthritis   . Anxiety disorder   . Chronic insomnia   . Incontinence of urine     Medications:  Scheduled:  . carbidopa-levodopa  1 tablet Oral TID  . clonazePAM  0.5 mg Oral Daily  . clonazePAM  1 mg Oral QHS  . cloNIDine  0.1 mg Transdermal Weekly  . DULoxetine  60 mg Oral Daily  . fentaNYL  25 mcg Transdermal Q72H  . gabapentin  200 mg Oral BID  . sodium chloride  3 mL Intravenous Q12H  . thiamine  100 mg Intravenous Daily    Assessment: 79 yo female transferred to [facility] for acute delirium and hypoxia, now on lovenox for treatment of DVT.   Goal of Therapy:  Anti-Xa level 0.6-1.2 units/ml 4hrs after LMWH dose given Monitor platelets by anticoagulation protocol: Yes   Plan:  Start Lovenox 70mg  SQ bid. CBC q72hrs.  Wendie Simmer, PharmD, BCPS Clinical Pharmacist  Pager: [phone]

## 2013-06-22 NOTE — Progress Notes (Signed)
*  PRELIMINARY RESULTS* Vascular Ultrasound Lower extremity venous duplex has been completed.  Preliminary findings: DVT involving the right gastroc vein. No other DVT noted bilaterally.           Left baker's cyst.   Farrel Demark, RDMS, RVT  06/22/2013, 2:00 PM

## 2013-06-22 NOTE — Consult Note (Signed)
PULMONARY  / CRITICAL CARE MEDICINE  Name: Kim Nichols MRN: [id number] DOB: 1929-07-10    ADMISSION DATE:  06/21/2013 CONSULTATION DATE:  06/21/2013  REFERRING MD :  [facility] PRIMARY SERVICE:  PCCM  CHIEF COMPLAINT:  Acute delirium / hypoxia  BRIEF PATIENT DESCRIPTION: 79 yo with past medical history of hypertension, suspected Parkinson's disease and distant DVT (not on anticoagulation) admitted to [facility] on 6/20 after falling and injuring her right hip.  There was no head injury.  She was diagnosed with R hip fracture and underwent surgical repair.  Postoperatively she developed acute encephalopathy and fever.  PNA / UTI were suspected.  Antibiotics were started.  She was transferred to [facility] under [facility] care on 6/23. PCCM consul ation was requested as patient was acutely delirious and hypoxic.  Patient was transferred to 2100 for further management.  SIGNIFICANT EVENTS / STUDIES:  6/20  Admitted to [facility] with R hip fracture, surgically repaired 6/21  Developed acute encephalopathy / fever, antibiotics started 6/23  Transferred to [facility] under [facility]H, hypoxic, encephalopathic, transferred to ICU  LINES / TUBES: Foley ??? >>>  CULTURES: 6/23 Blood >>> 6/23 Urine >>>  ANTIBIOTICS: Zosyn ? 6/21 >>> Vancomycin ? 6/21 >>> Levaquin 6/23 >>>  The patient is encephalopathic and unable to provide history, which was obtained for available medical records.  HISTORY OF PRESENT ILLNESS:  79 yo with past medical history of hypertension, suspected Parkinson's disease and distant DVT (not on anticoagulation) admitted to [facility] on 6/20 after falling and injuring her right hip.  There was no head injury.  She was diagnosed with R hip fracture and underwent surgical repair.  Postoperatively she developed acute encephalopathy and fever.  PNA / UTI were suspected.  Antibiotics were started.  She was transferred to [facility] under [facility] care on 6/23. PCCM consul ation was  requested as patient was acutely delirious and hypoxic.  Patient was transferred to 2100 for further management.  PAST MEDICAL HISTORY :  Past Medical History  Diagnosis Date  . Hypertension   . Diabetes mellitus without complication   . Dyslipidemia   . History of gastroesophageal reflux (GERD)   . Osteoarthritis   . Anxiety disorder   . Chronic insomnia   . Incontinence of urine    Past Surgical History  Procedure Laterality Date  . Knee surgery  4-11   Prior to Admission medications   Medication Sig Start Date End Date Taking? Authorizing Provider  carbidopa-levodopa (SINEMET IR) 25-100 MG per tablet Take 1 tablet by mouth 3 (three) times daily. 04/27/13  Yes Suanne Marker, MD  cloNIDine (CATAPRES) 0.1 MG tablet Take 0.1 mg by mouth at bedtime.  03/22/13  Yes Historical Provider, MD  DULoxetine (CYMBALTA) 60 MG capsule Take 60 mg by mouth daily.  04/16/13  Yes Historical Provider, MD  enalapril (VASOTEC) 20 MG tablet Take 20 mg by mouth daily.  04/16/13  Yes Historical Provider, MD  esomeprazole (NEXIUM) 40 MG capsule Take 40 mg by mouth daily before breakfast.   Yes Historical Provider, MD  felodipine (PLENDIL) 5 MG 24 hr tablet Take 5 mg by mouth daily.  03/22/13  Yes Historical Provider, MD  gabapentin (NEURONTIN) 300 MG capsule Take 300 mg by mouth 3 (three) times daily.  03/18/13  Yes Historical Provider, MD  HYDROcodone-acetaminophen (NORCO) 7.5-325 MG per tablet Take 1 tablet by mouth daily as needed for pain.  03/25/13  Yes Historical Provider, MD  LORazepam (ATIVAN) 1 MG tablet Take 1  mg by mouth 2 (two) times daily.  03/22/13  Yes Historical Provider, MD  Multiple Vitamin (MULTIVITAMIN WITH MINERALS) TABS Take 1 tablet by mouth daily. Vision Formula with Lutein   Yes Historical Provider, MD  simvastatin (ZOCOR) 40 MG tablet Take 40 mg by mouth at bedtime.  04/16/13  Yes Historical Provider, MD  Calcium Carbonate-Vitamin D (CALCIUM 600 + D PO) Take 600 mg by mouth once. Take 2  tabs once a day    Historical Provider, MD  zolpidem (AMBIEN) 10 MG tablet Take 10 mg by mouth at bedtime.  03/12/13   Historical Provider, MD   Allergies  Allergen Reactions  . Aleve (Naproxen Sodium) Itching   FAMILY HISTORY:  History reviewed. No pertinent family history.  SOCIAL HISTORY:  reports that she has never smoked. She has never used smokeless tobacco. She reports that she does not drink alcohol or use illicit drugs.  REVIEW OF SYSTEMS:  Unable to provide.  INTERVAL HISTORY:  VITAL SIGNS: Weight:  [68.5 kg (151 lb 0.2 oz)] 68.5 kg (151 lb 0.2 oz) (06/23 2100)  HEMODYNAMICS:   VENTILATOR SETTINGS:   INTAKE / OUTPUT: Intake/Output   None    PHYSICAL EXAMINATION: General:  No respiratory distress Neuro:  Encephalopathic / agitated / confused; non focal / cough / gag intact HEENT:  PERRL, moist membranes Cardiovascular:  RRR, no m/r/g Lungs:  Bilateral diminished air entry, diffuse rales Abdomen:  Soft, nontender, bowel sounds diminished Musculoskeletal:  Moves all extremities, R hip surgical site is clean, dry and intact Skin:  Intact  LABS:  Labs from outside facility reviewed  Recent Labs Lab 06/21/13 2232  PHART 7.383  PCO2ART 52.5*  PO2ART 104.0*    Recent Labs Lab 06/21/13 2239  GLUCAP 116*   CXR:  6/23 >>>  Bilateral patchy airspace disease  ASSESSMENT / PLAN:  PULMONARY A:  Suspected pneumonia (aspiration vs HCAP).  Possible ARDS.  Less likely pulmonary edema or pulmonary embolism.  Protects airway. P:   Gaol SpO2>92 Supplemental oxygen Trend ABG / CXR Unable to CTA due to agitation Reluctant to treat for PE imperially as recent surgery and unable to transfuse ( Jehovah's Witness) and probability is low given airspace disease on CXR Venous Doppler BL LE  CARDIOVASCULAR A: History of hypertension, dyslipidemia.  Now normotensive. No arrhythmia / ischemia. P:  Goal MAP>60 TTE Hold Clonidine, Enalapril, Felodipine,  Zocor  RENAL A:  No active issues. P:   Trend BMP NS@100   GASTROINTESTINAL A:  No active issues. P:   NPO as encephalopathic GI Px is not indicated  HEMATOLOGIC A:  Jehovah's Witness.  P:  Trend CBC SCDs for DVT Px May need iron / Epo  INFECTIOUS A:  UA speaks against UTI.  Possible pneumonia. P:   Cultures and antibiotics as above Levaquin added for atypical coverage PCT  ENDOCRINE  A:  No active issues. P:   No intervention required  NEUROLOGIC A:  Acute encephalopathy / delirium in setting of advanced age, recent surgery, possible infection, hypoxemia.  Suspected Parkinsonism.  Post op pain. P:   Goal RASS 0 to -1 Avoid Haldol Precedex gtt Morphine 1-2 q4h PRN Ammomia Neuro checks, CAM-ICU Sinemet when able CT cervical / lumbar spine held (not clear about indication, unable to do due to agitation) Hold Ambien, Hydrocodone, Ativan, Neurontin Cymbalta when able to avoid withdrawal  TODAY'S SUMMARY: Acute delirium after hip surgery - Precedex gtt, avoid Haldol (Parkinsonism) and benzodiazepines.  Fever / hypoxia / possible aspiration pneumonia -  PCT, abx.  TTE / venous Doppler LE in AM.  May need IV iron / Epo if anemic - Jehovah's Witness.   I have personally obtained a history, examined the patient, evaluated laboratory and imaging results, formulated the assessment and plan and placed orders.  CRITICAL CARE:  The patient is critically ill with multiple organ systems failure and requires high complexity decision making for assessment and support, frequent evaluation and titration of therapies, application of advanced monitoring technologies and extensive interpretation of multiple databases. Critical Care Time devoted to patient care services described in this note is 55 minutes.   Lonia Farber, MD Pulmonary and Critical Care Medicine [facility] Pager: [phone]  06/22/2013, 12:02 AM

## 2013-06-22 NOTE — Progress Notes (Signed)
Subjective: Acute confusion with agitation     Objective: Vital signs in last 24 hours: Temp:  [97.6 F (36.4 C)-98.3 F (36.8 C)] 97.8 F (36.6 C) (06/24 0347) Pulse Rate:  [61-96] 73 (06/24 0400) Resp:  [10-23] 20 (06/24 0400) BP: (107-168)/(46-91) 107/46 mmHg (06/24 0400) SpO2:  [92 %-100 %] 96 % (06/24 0400) FiO2 (%):  [100 %] 100 % (06/24 0100) Weight:  [67.8 kg (149 lb 7.6 oz)-68.5 kg (151 lb 0.2 oz)] 67.8 kg (149 lb 7.6 oz) (06/24 0000) Weight change:     Intake/Output from previous day: 06/23 0701 - 06/24 0700 In: 71.8 [I.V.:34.3; IV Piggyback:37.5] Out: 355 [Urine:355] Intake/Output this shift: Total I/O In: 71.8 [I.V.:34.3; IV Piggyback:37.5] Out: 355 [Urine:355]  General appearance: alert Resp: clear to auscultation bilaterally Cardio: regular rate and rhythm Extremities: right hip surgical site clean with staples  Lab Results:  Recent Labs  06/22/13 0215  WBC 6.0  HGB 8.5*  HCT 26.1*  PLT 157   BMET  Recent Labs  06/22/13 0215  NA 141  K 3.5  CL 102  CO2 35*  GLUCOSE 134*  BUN 12  CREATININE 0.63  CALCIUM 8.5    Studies/Results: Dg Chest Port 1 View  06/21/2013   *RADIOLOGY REPORT*  Clinical Data: Check for infiltrates.  Low O2 sats.  PORTABLE CHEST - 1 VIEW  Comparison: None.  Findings: Mild peribronchial thickening.  Patchy bilateral airspace opacities.  It is unclear this represents chronic lung disease or patchy acute infiltrates.  Left base atelectasis or scarring. Heart is borderline in size.  No significant effusions.  No acute bony abnormality.  IMPRESSION: Diffuse peribronchial thickening with patchy bilateral airspace opacities, question chronic lung disease versus acute infiltrates.   Original Report Authenticated By: Charlett Nose, M.D.    Medications: I have reviewed the patient's current medications.  Assessment/Plan: Altered MS/ acute delirium/ encephalopathy- multiple factor- possible Hypoxia- PNA/ medication  withdrawal PNA- ? Aspiration- on Broad spectrum antibiotic ICU care with PCCM  Service Underlying Depression/ anxiety, and neuropathy/ tremor with parkinson?- ? Sinemet recent started- will check Office notes. May consider restarting cymbalta if po.  She was also on Ativan prn at home PCCM service while in ICU.  LOS: 1 day   [facility] 06/22/2013, 6:34 AM

## 2013-06-22 NOTE — Progress Notes (Signed)
Echocardiogram 2D Echocardiogram has been performed.  Marquesha Robideau 06/22/2013, 12:47 PM

## 2013-06-22 NOTE — Consult Note (Signed)
PULMONARY  / CRITICAL CARE MEDICINE  Name: Kim Nichols MRN: [id number] DOB: 1929/07/02    ADMISSION DATE:  06/21/2013 CONSULTATION DATE:  06/21/2013  REFERRING MD :  [facility] PRIMARY SERVICE:  PCCM  CHIEF COMPLAINT:  Acute delirium / hypoxia  BRIEF PATIENT DESCRIPTION: 79 yo with history of hypertension, suspected Parkinson's disease (recently started on Sinemet) and remote DVT admitted to [facility] on 6/20 with R hip fx. Underwent ORIF. Postoperatively she developed acute encephalopathy and fever. Received abx for suspected PNA / UTI. Transferred to [facility] under [facility] care on 6/23. PCCM consulted for severe delirium after transfer to ICU   SIGNIFICANT EVENTS / STUDIES:  6/20  Admitted to [facility] with R hip fracture, surgically repaired 6/21  Developed acute encephalopathy / fever, antibiotics started 6/23  Transferred to [facility] under [facility], hypoxic, encephalopathic, transferred to ICU 6/24 LE venous dopplers: Findings consistent with deep vein thrombosis involving the right gastrocnemius vein  LINES / TUBES:   CULTURES: 6/23 Blood >>> 6/23 Urine >>> PCT  6/24: 0.22,     ANTIBIOTICS: Zosyn 6/21 >> 6/24 Vancomycin 6/21 >> 6/24 Levaquin 6/23 >> 6/24   SUBJ: Severely agitated and moderately confused.   VITAL SIGNS: Temp:  [97.5 F (36.4 C)-98.3 F (36.8 C)] 97.5 F (36.4 C) (06/24 1532) Pulse Rate:  [44-96] 44 (06/24 1600) Resp:  [10-25] 24 (06/24 1600) BP: (102-194)/(46-176) 162/62 mmHg (06/24 1600) SpO2:  [88 %-100 %] 98 % (06/24 1600) FiO2 (%):  [100 %] 100 % (06/24 0100) Weight:  [67.8 kg (149 lb 7.6 oz)-68.5 kg (151 lb 0.2 oz)] 67.8 kg (149 lb 7.6 oz) (06/24 0000)  HEMODYNAMICS:   VENTILATOR SETTINGS: Vent Mode:  [-]  FiO2 (%):  [100 %] 100 % INTAKE / OUTPUT: Intake/Output     06/23 0701 - 06/24 0700 06/24 0701 - 06/25 0700   I.V. (mL/kg) 51.3 (0.8) 241.7 (3.6)   IV Piggyback 150    Total Intake(mL/kg) 201.3 (3) 241.7 (3.6)   Urine  (mL/kg/hr) 405 575 (0.9)   Total Output 405 575   Net -203.7 -333.3         PHYSICAL EXAMINATION: General:  No distress, poorly oriented Neuro: Confused, no focal deficits HEENT: WNL Cardiovascular:  RRR s M Lungs: Clear anteriorly Abdomen:  Soft, nontender, bowel sounds normal Ext: no edema, warm, well healed R hip scar  LABS:   Recent Labs Lab 06/21/13 2232 06/22/13 0215 06/22/13 0235  HGB  --  8.5*  --   WBC  --  6.0  --   PLT  --  157  --   NA  --  141  --   K  --  3.5  --   CL  --  102  --   CO2  --  35*  --   GLUCOSE  --  134*  --   BUN  --  12  --   CREATININE  --  0.63  --   CALCIUM  --  8.5  --   MG  --  1.8  --   AST  --  25  --   ALT  --  14  --   ALKPHOS  --  52  --   BILITOT  --  0.4  --   PROT  --  5.7*  --   ALBUMIN  --  2.6*  --   PROCALCITON  --   --  0.22  PHART 7.383  --   --   PCO2ART  52.5*  --   --   PO2ART 104.0*  --   --     Recent Labs Lab 06/21/13 2239 06/22/13 0019  GLUCAP 116* 112*   CXR:  nnf  ASSESSMENT / PLAN: NEUROLOGIC A:  Acute encephalopathy/ agitated delirium - likely multifactorial P:   No Haldol due to Parkinson's hx!!! Dexmedetomidine  Gtt - wean to off as tolerated Resume clonidine Resume duloxetine Resume gabapentin @ reduced dose Resume benzo's (uses chronically) - clonazepam for longer DOA Fentanyl patch for presumed post op pain D/C Sinemet as this is a new med for her   PULMONARY A:  Hypoxemia IS prominence on CXR - edema vs chronic changes P:   Cont supplemental O2 to maintain SpO2 > 92%  CARDIOVASCULAR A: History of hypertension, dyslipidemia RLE  DVT by Dopplers 6/24 P:  LMWH per pharmacy  RENAL A:  No active issues. P:   Monitor IVFs until taking POs adequately  GASTROINTESTINAL A:  No active issues. P:   Begin diet with assistance  HEMATOLOGIC A:  Post op anemia Jehovah's Witness.  P:  Trend CBC Full LMWH for RLE DVT  INFECTIOUS A:  No overt infectious process  identified P:   D/C abx Micro and abx as above   ENDOCRINE  A:  No active issues. P:   No intervention required   Billy Fischer, MD ; [facility] [phone].  After 5:30 PM or weekends, call [phone]

## 2013-06-23 ENCOUNTER — Inpatient Hospital Stay (HOSPITAL_COMMUNITY): Payer: Medicare Other

## 2013-06-23 DIAGNOSIS — S72009A Fracture of unspecified part of neck of unspecified femur, initial encounter for closed fracture: Secondary | ICD-10-CM

## 2013-06-23 DIAGNOSIS — R5381 Other malaise: Secondary | ICD-10-CM

## 2013-06-23 LAB — BASIC METABOLIC PANEL
CO2: 34 mEq/L — ABNORMAL HIGH (ref 19–32)
Chloride: 99 mEq/L (ref 96–112)
Creatinine, Ser: 0.65 mg/dL (ref 0.50–1.10)
GFR calc Af Amer: >90 mL/min (ref >90)
Potassium: 3 mEq/L — ABNORMAL LOW (ref 3.5–5.1)
Sodium: 137 mEq/L (ref 135–145)

## 2013-06-23 LAB — CBC
HCT: 23.8 % — ABNORMAL LOW (ref 36.0–46.0)
Hemoglobin: 8 g/dL — ABNORMAL LOW (ref 12.0–15.0)
MCV: 89.1 fL (ref 78.0–100.0)
RBC: 2.67 MIL/uL — ABNORMAL LOW (ref 3.87–5.11)
RDW: 12.7 % (ref 11.5–15.5)
WBC: 5.3 10*3/uL (ref 4.0–10.5)

## 2013-06-23 LAB — PROCALCITONIN: Procalcitonin: 0.13 ng/mL

## 2013-06-23 LAB — PROTIME-INR: INR: 1 (ref 0.00–1.49)

## 2013-06-23 MED ORDER — WARFARIN - PHARMACIST DOSING INPATIENT: Freq: Every day | Status: DC

## 2013-06-23 MED ORDER — OXYCODONE HCL 5 MG PO TABS
5.0000 mg | ORAL_TABLET | Freq: Four times a day (QID) | ORAL | Status: DC | PRN
  Administered 2013-06-23 – 2013-06-25 (×6): 5 mg via ORAL
  Filled 2013-06-23 (×6): qty 1

## 2013-06-23 MED ORDER — CHLORHEXIDINE GLUCONATE 0.12 % MT SOLN
15.0000 mL | Freq: Two times a day (BID) | OROMUCOSAL | Status: DC
  Administered 2013-06-23 – 2013-06-25 (×3): 15 mL via OROMUCOSAL
  Filled 2013-06-23 (×7): qty 15

## 2013-06-23 MED ORDER — WARFARIN VIDEO
Freq: Once | Status: AC
  Administered 2013-06-23: 19:00:00

## 2013-06-23 MED ORDER — CARBIDOPA-LEVODOPA 25-100 MG PO TABS
0.5000 | ORAL_TABLET | Freq: Three times a day (TID) | ORAL | Status: DC
  Administered 2013-06-23 (×2): 0.5 via ORAL
  Filled 2013-06-23 (×5): qty 0.5

## 2013-06-23 MED ORDER — WARFARIN SODIUM 5 MG PO TABS
5.0000 mg | ORAL_TABLET | Freq: Once | ORAL | Status: AC
  Administered 2013-06-23: 5 mg via ORAL
  Filled 2013-06-23: qty 1

## 2013-06-23 MED ORDER — BIOTENE DRY MOUTH MT LIQD
15.0000 mL | Freq: Two times a day (BID) | OROMUCOSAL | Status: DC
  Administered 2013-06-23 – 2013-06-25 (×5): 15 mL via OROMUCOSAL

## 2013-06-23 MED ORDER — COUMADIN BOOK
Freq: Once | Status: AC
  Administered 2013-06-23: 19:00:00
  Filled 2013-06-23: qty 1

## 2013-06-23 MED ORDER — POTASSIUM CHLORIDE 10 MEQ/100ML IV SOLN
10.0000 meq | INTRAVENOUS | Status: AC
  Administered 2013-06-23 (×6): 10 meq via INTRAVENOUS
  Filled 2013-06-23: qty 200
  Filled 2013-06-23: qty 400

## 2013-06-23 NOTE — Evaluation (Signed)
Physical Therapy Evaluation Patient Details Name: Kim Nichols MRN: [id number] DOB: 06/03/1929 Today's Date: 06/23/2013 Time: 4098-1191 PT Time Calculation (min): 24 min  PT Assessment / Plan / Recommendation History of Present Illness   79 yo with history of hypertension, suspected Parkinson's disease (recently started on Sinemet) and remote DVT admitted to [facility] on 6/20 with R hip fx. Underwent ORIF. Postoperatively she developed acute encephalopathy and fever. Received abx for suspected PNA / UTI.   Clinical Impression  She presents today generally weak and cognitively better.  She would benefit form extensive therapy before returning to independent living environment.      PT Assessment  Patient needs continued PT services    Follow Up Recommendations  CIR    Does the patient have the potential to tolerate intense rehabilitation     yes  Barriers to Discharge  none      Equipment Recommendations  3in1 (PT)    Recommendations for Other Services Rehab consult   Frequency Min 5X/week    Precautions / Restrictions Precautions Precautions: Fall Precaution Comments: h/o fall Restrictions Weight Bearing Restrictions:  (none ordered, family doesn't know)   Pertinent Vitals/Pain See vitals flow sheet. O2 90% on RA, re applied 2 L O2  [place] after transfer       Mobility  Bed Mobility Bed Mobility: Supine to Sit;Sitting - Scoot to Edge of Bed Supine to Sit: 4: Min assist;With rails;HOB elevated Sitting - Scoot to [place] of Bed: 4: Min assist;With rail Details for Bed Mobility Assistance: min assist to support trunk and give pt something to pull against.  Verbal cues for technique and hand placmeent.   Transfers Transfers: Sit to Stand;Stand to Sit;Stand Pivot Transfers Sit to Stand: 3: Mod assist;From elevated surface;With upper extremity assist;With armrests;From bed;From chair/3-in-1 Stand to Sit: 3: Mod assist;With upper extremity assist;With armrests;To  bed;To chair/3-in-1 Stand Pivot Transfers: 3: Mod assist;From elevated surface;With armrests Details for Transfer Assistance: mod assist to support trunk for balance.  Pt with posterior lean in standing.  Used RW to transfer to [facility], back to bed and then to recliner chair.   Ambulation/Gait Ambulation/Gait Assistance: Not tested (comment) (due to unconfirmed WB status of right leg)        PT Diagnosis: Difficulty walking;Abnormality of gait;Generalized weakness;Acute pain  PT Problem List: Decreased strength;Decreased range of motion;Decreased balance;Decreased activity tolerance;Decreased mobility;Decreased knowledge of use of DME;Decreased knowledge of precautions;Pain PT Treatment Interventions: DME instruction;Gait training;Functional mobility training;Therapeutic activities;Therapeutic exercise;Balance training;Neuromuscular re-education;Cognitive remediation;Patient/family education;Modalities     PT Goals(Current goals can be found in the care plan section) Acute Rehab PT Goals Patient Stated Goal: to get strong enough to go back to independent living.   PT Goal Formulation: With patient/family Time For Goal Achievement: 07/07/13 Potential to Achieve Goals: Good  Visit Information  Last PT Received On: 06/23/13 Assistance Needed: +1 History of Present Illness:  79 yo with history of hypertension, suspected Parkinson's disease (recently started on Sinemet) and remote DVT admitted to [facility] on 6/20 with R hip fx. Underwent ORIF. Postoperatively she developed acute encephalopathy and fever. Received abx for suspected PNA / UTI.        Prior Functioning  Home Living Family/patient expects to be discharged to:: Inpatient rehab Living Arrangements: Alone Additional Comments: lived alone PTA, drives, has a cane, RW, bath chair with a back.   Prior Function Level of Independence: Independent with assistive device(s) Comments: independent at times used cane, did her  own cooking and cleaning  and bathing.   Communication Communication: No difficulties Dominant Hand: Right    Cognition  Cognition Arousal/Alertness: Awake/alert Behavior During Therapy: WFL for tasks assessed/performed Memory: Decreased short-term memory    Extremity/Trunk Assessment Upper Extremity Assessment Upper Extremity Assessment: Overall WFL for tasks assessed Lower Extremity Assessment Lower Extremity Assessment: RLE deficits/detail;LLE deficits/detail RLE Deficits / Details: right leg limited by pain and weakness due to post-op status.  Grossly 3-/5 throughout.   LLE Deficits / Details: grossly 4/5 throughout.     Balance Balance Balance Assessed: Yes Static Sitting Balance Static Sitting - Balance Support: Bilateral upper extremity supported;Feet supported Static Sitting - Level of Assistance: 5: Stand by assistance Static Sitting - Comment/# of Minutes: mild posterior lean in sitting Static Standing Balance Static Standing - Balance Support: Bilateral upper extremity supported Static Standing - Level of Assistance: 4: Min assist Static Standing - Comment/# of Minutes: min assist to stand while PT preformed peri care after toileting.    End of Session PT - End of Session Activity Tolerance: Patient limited by fatigue;Patient limited by pain Patient left: in chair;with call bell/phone within reach;with family/visitor present (daughter and step son) Nurse Communication: Mobility status (to Hydrologist)    Lurena Joiner B. Melat Wrisley, PT, DPT [phone]   06/23/2013, 5:43 PM

## 2013-06-23 NOTE — Progress Notes (Signed)
Foley removed at 1053

## 2013-06-23 NOTE — Progress Notes (Signed)
Gave report to 35, Charity fundraiser. All questions answered. Pt being transported in bed with oxygen and IV.

## 2013-06-23 NOTE — Progress Notes (Addendum)
Pt for possible CIR admit.  Pt & family want CIR.  Need to get auth from [employer].  Also, pt will need to be medically ready & have a CIR bed available.  Will f/up tomorrow.  [phone]  Need to know about WB R. LE. Thanks

## 2013-06-23 NOTE — Progress Notes (Signed)
PULMONARY  / CRITICAL CARE MEDICINE  Name: Kim Nichols MRN: [id number] DOB: 04-Nov-1929    ADMISSION DATE:  06/21/2013 CONSULTATION DATE:  06/21/2013  REFERRING MD :  [facility] PRIMARY SERVICE:  PCCM  CHIEF COMPLAINT:  Acute delirium / hypoxia  BRIEF PATIENT DESCRIPTION: 79 yo with history of hypertension, suspected Parkinson's disease (recently started on Sinemet) and remote DVT admitted to [facility] on 6/20 with R hip fx. Underwent ORIF. Postoperatively she developed acute encephalopathy and fever. Received abx for suspected PNA / UTI. Transferred to [facility] under TRH care on 6/23. PCCM consulted for severe delirium after transfer to ICU   SIGNIFICANT EVENTS / STUDIES:  6/20  Admitted to [facility] with R hip fracture, surgically repaired 6/21  Developed acute encephalopathy / fever, antibiotics started 6/23  Transferred to [facility] under TRH, hypoxic, encephalopathic, transferred to ICU 6/24 LE venous dopplers: Findings consistent with deep vein thrombosis involving the right gastrocnemius vein  LINES / TUBES:   CULTURES: 6/23 Blood >>> 6/23 Urine >>> PCT  6/24: 0.22,     ANTIBIOTICS: Zosyn 6/21 >> 6/24 Vancomycin 6/21 >> 6/24 Levaquin 6/23 >> 6/24   SUBJ: Much improved/resolved delirium. No new complaints. No distress   VITAL SIGNS: Temp:  [97.5 F (36.4 C)-98.1 F (36.7 C)] 97.7 F (36.5 C) (06/25 1135) Pulse Rate:  [43-80] 73 (06/25 1135) Resp:  [15-30] 19 (06/25 1135) BP: (102-172)/(44-97) 161/61 mmHg (06/25 1135) SpO2:  [94 %-100 %] 100 % (06/25 1135) Weight:  [67.4 kg (148 lb 9.4 oz)-69 kg (152 lb 1.9 oz)] 67.4 kg (148 lb 9.4 oz) (06/25 1135)  HEMODYNAMICS:   VENTILATOR SETTINGS:   INTAKE / OUTPUT: Intake/Output     06/24 0701 - 06/25 0700 06/25 0701 - 06/26 0700   P.O. 150    I.V. (mL/kg) 1051.1 (15.2)    IV Piggyback 300 200   Total Intake(mL/kg) 1501.1 (21.8) 200 (3)   Urine (mL/kg/hr) 1065 (0.6) 925 (2.5)   Stool  1 (0)   Total  Output 1065 926   Net +436.1 -726        Stool Occurrence 1 x     PHYSICAL EXAMINATION: General: NAD Neuro: Cognition intact, MAEs, no focal deficits HEENT: WNL Cardiovascular:  RRR s M Lungs: Clear anteriorly Abdomen:  Soft, nontender, bowel sounds normal Ext: no edema, warm, well healed R hip scar  LABS:   Recent Labs Lab 06/21/13 2232 06/22/13 0215 06/22/13 0235 06/23/13 0304 06/23/13 0950  HGB  --  8.5*  --  8.0*  --   WBC  --  6.0  --  5.3  --   PLT  --  157  --  162  --   NA  --  141  --  137  --   K  --  3.5  --  3.0*  --   CL  --  102  --  99  --   CO2  --  35*  --  34*  --   GLUCOSE  --  134*  --  135*  --   BUN  --  12  --  12  --   CREATININE  --  0.63  --  0.65  --   CALCIUM  --  8.5  --  8.1*  --   MG  --  1.8  --   --   --   AST  --  25  --   --   --   ALT  --  14  --   --   --   ALKPHOS  --  52  --   --   --   BILITOT  --  0.4  --   --   --   PROT  --  5.7*  --   --   --   ALBUMIN  --  2.6*  --   --   --   INR  --   --   --   --  1.00  PROCALCITON  --   --  0.22 0.13  --   PROBNP  --   --   --  5146.0*  --   PHART 7.383  --   --   --   --   PCO2ART 52.5*  --   --   --   --   PO2ART 104.0*  --   --   --   --     Recent Labs Lab 06/21/13 2239 06/22/13 0019  GLUCAP 116* 112*   CXR:  nnf  ASSESSMENT / PLAN: NEUROLOGIC A:  Acute encephalopathy/ agitated delirium, resolved P:   Cont current Rx I have placed her on half dose of Sinemet - Dr Donette Larry to decide whether to continue this med   PULMONARY A:  Hypoxemia, improving IS prominence on CXR - edema vs chronic changes P:   Cont supplemental O2 to maintain SpO2 > 92%  CARDIOVASCULAR A: History of hypertension, dyslipidemia RLE  DVT by Dopplers 6/24 P:  LMWH per pharmacy   GASTROINTESTINAL A:  No active issues. P:   Advance diet  HEMATOLOGIC A:  Post op anemia [employer].  P:  Trend CBC Full LMWH for RLE DVT  ORTHO: Post R hip ORIF  PO analgesics ordered PT  consult ordered  Transfer to [facility] 6/25. Discussed with Dr Donette Larry. He will resume primary duties 6/26 AM and PCCM will sign off   Billy Fischer, MD ; [facility] [phone].  After 5:30 PM or weekends, call [phone]

## 2013-06-23 NOTE — Progress Notes (Signed)
eLink Physician-Brief Progress Note Patient Name: Kim Nichols DOB: Sep 10, 1929 MRN: [id number]  Date of Service  06/23/2013   HPI/Events of Note  Hypokalemia   eICU Interventions  Potassium replaced   Intervention Category Intermediate Interventions: Electrolyte abnormality - evaluation and management  Conchetta Lamia 06/23/2013, 3:53 AM

## 2013-06-23 NOTE — H&P (Signed)
Physical Medicine and Rehabilitation Admission H&P    No chief complaint on file. : HPI: Kim Nichols is a 79 y.o. right-handed female with history of questionable Parkinson's disease versus movement disorder and recently placed on Sinemet. Kim Nichols was independent and active prior to admission as well as driving. Patient with recent right subcapital femur fracture at [facility] and underwent ORIF 06/18/2013. Postoperatively she developed acute encephalopathy with fever and received antibiotics for suspect pneumonia/UTI. She was transferred to [facility] on 6/23 for ongoing medical care. Recent cranial CT scan at outside hospital reported to be negative. Lower extremity Dopplers consistent with deep vein thrombosis involving the right gastrocnemius vein. Patient maintained on broad-spectrum antibiotics for pneumonia/urinary tract infection. Echocardiogram with ejection fraction of 65% and normal systolic function. Chest x-ray completed showing diffuse peribronchial thickening with patchy bilateral airspace opacities question chronic lung disease versus acute infiltrates. Placed on Xarelto secondary to DVT. Noted delirium/confusion felt to be multifactorial in nature and narcotics were minimized. Her Sinemet was also discontinued secondary to possible cause of confusion. Acute blood loss anemia after hip fracture latest hemoglobin of 8.0 and monitored with iron supplement added ( patient is Jehovah witness). Physical and occupational therapy evaluations completed an ongoing. Patient is partial weightbearing status after right hip surgery. M.D. is requested physical medicine rehabilitation consult to consider inpatient rehabilitation services. Pt was ultimately admitted today.    Review of Systems  Gastrointestinal:  GERD  Genitourinary:  Occasional incontinence of urine  Musculoskeletal: Positive for myalgias and falls.  Psychiatric/Behavioral:  Anxiety /depression All other  systems reviewed and are negative    Past Medical History  Diagnosis Date  . Hypertension   . Diabetes mellitus without complication   . Dyslipidemia   . History of gastroesophageal reflux (GERD)   . Osteoarthritis   . Anxiety disorder   . Chronic insomnia   . Incontinence of urine    Past Surgical History  Procedure Laterality Date  . Knee surgery  4-11   History reviewed. No pertinent family history. Social History:  reports that she has never smoked. She has never used smokeless tobacco. She reports that she does not drink alcohol or use illicit drugs. Allergies:  Allergies  Allergen Reactions  . Aleve (Naproxen Sodium) Itching   Medications Prior to Admission  Medication Sig Dispense Refill  . carbidopa-levodopa (SINEMET IR) 25-100 MG per tablet Take 1 tablet by mouth 3 (three) times daily.  60 tablet  2  . cloNIDine (CATAPRES) 0.1 MG tablet Take 0.1 mg by mouth at bedtime.       . DULoxetine (CYMBALTA) 60 MG capsule Take 60 mg by mouth daily.       . enalapril (VASOTEC) 20 MG tablet Take 20 mg by mouth daily.       Marland Kitchen esomeprazole (NEXIUM) 40 MG capsule Take 40 mg by mouth daily before breakfast.      . felodipine (PLENDIL) 5 MG 24 hr tablet Take 5 mg by mouth daily.       Marland Kitchen gabapentin (NEURONTIN) 300 MG capsule Take 300 mg by mouth 3 (three) times daily.       Marland Kitchen HYDROcodone-acetaminophen (NORCO) 7.5-325 MG per tablet Take 1 tablet by mouth daily as needed for pain.       Marland Kitchen LORazepam (ATIVAN) 1 MG tablet Take 1 mg by mouth 2 (two) times daily.       . Multiple Vitamin (MULTIVITAMIN WITH MINERALS) TABS Take 1 tablet by mouth daily. Vision Formula with Lutein      .  simvastatin (ZOCOR) 40 MG tablet Take 40 mg by mouth at bedtime.       . Calcium Carbonate-Vitamin D (CALCIUM 600 + D PO) Take 600 mg by mouth once. Take 2 tabs once a day      . zolpidem (AMBIEN) 10 MG tablet Take 10 mg by mouth at bedtime.         Home: Home Living Family/patient expects to be discharged  to:: Private residence Living Arrangements: Alone   Functional History:    Functional Status:  Mobility:          ADL:    Cognition: Cognition Orientation Level: Oriented X4    Physical Exam: Blood pressure 161/61, pulse 73, temperature 97.7 F (36.5 C), temperature source Oral, resp. rate 19, height 5\' 4"  (1.626 m), weight 67.4 kg (148 lb 9.4 oz), SpO2 100.00%. Physical Exam  Vitals reviewed.  Constitutional: She appears fairly well developed. alert HENT: oral mucosa pink and moist. Dentition fair.  Head: Normocephalic.  Eyes: EOM are normal.  Neck: Neck supple. No thyromegaly present.  Cardiovascular: Normal rate and regular rhythm.  Pulmonary/Chest: Effort normal and breath sounds normal.  Abdominal: Soft. Bowel sounds are normal. She exhibits no distension.  Neurological: She is alert.  Patient was able to provide her name, place, date of birth and age. She followed simple commands. Displayed good insight and awareness. Skin:  Hip incision healing with staples intact without drainage. Area is appropriately tender. Bruising and edema noted around the injury/op site. oriented to hospital in [place] but not [place], day/night confusion  Motor strength is 5/5 bilateral deltoid, biceps, triceps, grip  Right lower extremity 2 to 3 minus hip flexor knee extensor ankle dorsiflexor and plantar flexor are 4  5/5 in the left hip flexor knee extensor ankle dorsiflexor and plantar flexor. No sensory changes noted.  Babinski's are downgoing  No evidence of cogwheel rigidity in the upper extremities. No tremor. Affect is flat. Visual fields appear intact   Results for orders placed during the hospital encounter of 06/21/13 (from the past 48 hour(s))  MRSA PCR SCREENING     Status: None   Collection Time    06/21/13  8:53 PM      Result Value Range   MRSA by PCR NEGATIVE  NEGATIVE   Comment:            The GeneXpert MRSA Assay (FDA     approved for NASAL specimens      only), is one component of a     comprehensive MRSA colonization     surveillance program. It is not     intended to diagnose MRSA     infection nor to guide or     monitor treatment for     MRSA infections.  BLOOD GAS, ARTERIAL     Status: Abnormal   Collection Time    06/21/13 10:32 PM      Result Value Range   FIO2 1.00     O2 Content 15.0     Delivery systems NON-REBREATHER OXYGEN MASK     pH, Arterial 7.383  7.350 - 7.450   pCO2 arterial 52.5 (*) 35.0 - 45.0 mmHg   pO2, Arterial 104.0 (*) 80.0 - 100.0 mmHg   Bicarbonate 30.6 (*) 20.0 - 24.0 mEq/L   TCO2 32.2  0 - 100 mmol/L   Acid-Base Excess 5.7 (*) 0.0 - 2.0 mmol/L   O2 Saturation 98.7     Patient temperature 98.6     Collection  site RIGHT RADIAL     Drawn by [phone]     Sample type ARTERIAL     Allens test (pass/fail) PASS  PASS  GLUCOSE, CAPILLARY     Status: Abnormal   Collection Time    06/21/13 10:39 PM      Result Value Range   Glucose-Capillary 116 (*) 70 - 99 mg/dL  URINALYSIS, ROUTINE W REFLEX MICROSCOPIC     Status: Abnormal   Collection Time    06/21/13 11:03 PM      Result Value Range   Color, Urine YELLOW  YELLOW   APPearance CLOUDY (*) CLEAR   Specific Gravity, Urine 1.028  1.005 - 1.030   pH 6.0  5.0 - 8.0   Glucose, UA NEGATIVE  NEGATIVE mg/dL   Hgb urine dipstick TRACE (*) NEGATIVE   Bilirubin Urine NEGATIVE  NEGATIVE   Ketones, ur 15 (*) NEGATIVE mg/dL   Protein, ur 30 (*) NEGATIVE mg/dL   Urobilinogen, UA 1.0  0.0 - 1.0 mg/dL   Nitrite NEGATIVE  NEGATIVE   Leukocytes, UA NEGATIVE  NEGATIVE  URINE CULTURE     Status: None   Collection Time    06/21/13 11:03 PM      Result Value Range   Specimen Description URINE, CATHETERIZED     Special Requests NONE     Culture  Setup Time 06/21/2013 23:20     Colony Count NO GROWTH     Culture NO GROWTH     Report Status 06/22/2013 FINAL    URINE RAPID DRUG SCREEN (HOSP PERFORMED)     Status: Abnormal   Collection Time    06/21/13 11:03 PM       Result Value Range   Opiates POSITIVE (*) NONE DETECTED   Cocaine NONE DETECTED  NONE DETECTED   Benzodiazepines NONE DETECTED  NONE DETECTED   Amphetamines NONE DETECTED  NONE DETECTED   Tetrahydrocannabinol NONE DETECTED  NONE DETECTED   Barbiturates NONE DETECTED  NONE DETECTED   Comment:            DRUG SCREEN FOR MEDICAL PURPOSES     ONLY.  IF CONFIRMATION IS NEEDED     FOR ANY PURPOSE, NOTIFY LAB     WITHIN 5 DAYS.                LOWEST DETECTABLE LIMITS     FOR URINE DRUG SCREEN     Drug Class       Cutoff (ng/mL)     Amphetamine      1000     Barbiturate      200     Benzodiazepine   200     Tricyclics       300     Opiates          300     Cocaine          300     THC              50  URINE MICROSCOPIC-ADD ON     Status: Abnormal   Collection Time    06/21/13 11:03 PM      Result Value Range   Squamous Epithelial / LPF FEW (*) RARE   WBC, UA 0-2  <3 WBC/hpf   RBC / HPF 0-2  <3 RBC/hpf   Bacteria, UA FEW (*) RARE   Crystals URIC ACID CRYSTALS (*) NEGATIVE  GLUCOSE, CAPILLARY     Status: Abnormal   Collection Time  06/22/13 12:19 AM      Result Value Range   Glucose-Capillary 112 (*) 70 - 99 mg/dL   Comment 1 Documented in Chart     Comment 2 Notify RN    COMPREHENSIVE METABOLIC PANEL     Status: Abnormal   Collection Time    06/22/13  2:15 AM      Result Value Range   Sodium 141  135 - 145 mEq/L   Potassium 3.5  3.5 - 5.1 mEq/L   Chloride 102  96 - 112 mEq/L   CO2 35 (*) 19 - 32 mEq/L   Glucose, Bld 134 (*) 70 - 99 mg/dL   BUN 12  6 - 23 mg/dL   Creatinine, Ser 8.29  0.50 - 1.10 mg/dL   Calcium 8.5  8.4 - 56.2 mg/dL   Total Protein 5.7 (*) 6.0 - 8.3 g/dL   Albumin 2.6 (*) 3.5 - 5.2 g/dL   AST 25  0 - 37 U/L   ALT 14  0 - 35 U/L   Alkaline Phosphatase 52  39 - 117 U/L   Total Bilirubin 0.4  0.3 - 1.2 mg/dL   GFR calc non Af Amer 81 (*) >90 mL/min   GFR calc Af Amer >90  >90 mL/min   Comment:            The eGFR has been calculated     using the  CKD EPI equation.     This calculation has not been     validated in all clinical     situations.     eGFR's persistently     <90 mL/min signify     possible Chronic Kidney Disease.  CBC WITH DIFFERENTIAL     Status: Abnormal   Collection Time    06/22/13  2:15 AM      Result Value Range   WBC 6.0  4.0 - 10.5 K/uL   RBC 2.87 (*) 3.87 - 5.11 MIL/uL   Hemoglobin 8.5 (*) 12.0 - 15.0 g/dL   HCT 13.0 (*) 86.5 - 78.4 %   MCV 90.9  78.0 - 100.0 fL   MCH 29.6  26.0 - 34.0 pg   MCHC 32.6  30.0 - 36.0 g/dL   RDW 69.6  29.5 - 28.4 %   Platelets 157  150 - 400 K/uL   Neutrophils Relative % 76  43 - 77 %   Neutro Abs 4.6  1.7 - 7.7 K/uL   Lymphocytes Relative 10 (*) 12 - 46 %   Lymphs Abs 0.6 (*) 0.7 - 4.0 K/uL   Monocytes Relative 8  3 - 12 %   Monocytes Absolute 0.5  0.1 - 1.0 K/uL   Eosinophils Relative 4  0 - 5 %   Eosinophils Absolute 0.3  0.0 - 0.7 K/uL   Basophils Relative 2 (*) 0 - 1 %   Basophils Absolute 0.1  0.0 - 0.1 K/uL  TSH     Status: None   Collection Time    06/22/13  2:15 AM      Result Value Range   TSH 2.049  0.350 - 4.500 uIU/mL  CULTURE, BLOOD (ROUTINE X 2)     Status: None   Collection Time    06/22/13  2:15 AM      Result Value Range   Specimen Description BLOOD LEFT ARM     Special Requests BOTTLES DRAWN AEROBIC ONLY 10CC     Culture  Setup Time 06/22/2013 09:45  Culture       Value:        BLOOD CULTURE RECEIVED NO GROWTH TO DATE CULTURE WILL BE HELD FOR 5 DAYS BEFORE ISSUING A FINAL NEGATIVE REPORT   Report Status PENDING    MAGNESIUM     Status: None   Collection Time    06/22/13  2:15 AM      Result Value Range   Magnesium 1.8  1.5 - 2.5 mg/dL  CULTURE, BLOOD (ROUTINE X 2)     Status: None   Collection Time    06/22/13  2:35 AM      Result Value Range   Specimen Description BLOOD RIGHT HAND     Special Requests BOTTLES DRAWN AEROBIC ONLY 10CC EACH     Culture  Setup Time 06/22/2013 09:45     Culture       Value:        BLOOD CULTURE  RECEIVED NO GROWTH TO DATE CULTURE WILL BE HELD FOR 5 DAYS BEFORE ISSUING A FINAL NEGATIVE REPORT   Report Status PENDING    PROCALCITONIN     Status: None   Collection Time    06/22/13  2:35 AM      Result Value Range   Procalcitonin 0.22     Comment:            Interpretation:     PCT (Procalcitonin) <= 0.5 ng/mL:     Systemic infection (sepsis) is not likely.     Local bacterial infection is possible.     (NOTE)             ICU PCT Algorithm               Non ICU PCT Algorithm        ----------------------------     ------------------------------             PCT < 0.25 ng/mL                 PCT < 0.1 ng/mL         Stopping of antibiotics            Stopping of antibiotics           strongly encouraged.               strongly encouraged.        ----------------------------     ------------------------------           PCT level decrease by               PCT < 0.25 ng/mL           >= 80% from peak PCT           OR PCT 0.25 - 0.5 ng/mL          Stopping of antibiotics                                                 encouraged.         Stopping of antibiotics               encouraged.        ----------------------------     ------------------------------           PCT level decrease by  PCT >= 0.25 ng/mL           < 80% from peak PCT            AND PCT >= 0.5 ng/mL            Continuing antibiotics                                                  encouraged.           Continuing antibiotics                encouraged.        ----------------------------     ------------------------------         PCT level increase compared          PCT > 0.5 ng/mL             with peak PCT AND              PCT >= 0.5 ng/mL             Escalation of antibiotics                                              strongly encouraged.          Escalation of antibiotics            strongly encouraged.  AMMONIA     Status: None   Collection Time    06/22/13  2:41 AM      Result Value Range    Ammonia 31  11 - 60 umol/L  BASIC METABOLIC PANEL     Status: Abnormal   Collection Time    06/23/13  3:04 AM      Result Value Range   Sodium 137  135 - 145 mEq/L   Potassium 3.0 (*) 3.5 - 5.1 mEq/L   Chloride 99  96 - 112 mEq/L   CO2 34 (*) 19 - 32 mEq/L   Glucose, Bld 135 (*) 70 - 99 mg/dL   BUN 12  6 - 23 mg/dL   Creatinine, Ser 1.61  0.50 - 1.10 mg/dL   Calcium 8.1 (*) 8.4 - 10.5 mg/dL   GFR calc non Af Amer 80 (*) >90 mL/min   GFR calc Af Amer >90  >90 mL/min   Comment:            The eGFR has been calculated     using the CKD EPI equation.     This calculation has not been     validated in all clinical     situations.     eGFR's persistently     <90 mL/min signify     possible Chronic Kidney Disease.  CBC     Status: Abnormal   Collection Time    06/23/13  3:04 AM      Result Value Range   WBC 5.3  4.0 - 10.5 K/uL   RBC 2.67 (*) 3.87 - 5.11 MIL/uL   Hemoglobin 8.0 (*) 12.0 - 15.0 g/dL   HCT 09.6 (*) 04.5 - 40.9 %   MCV 89.1  78.0 - 100.0 fL   MCH 30.0  26.0 - 34.0 pg   MCHC  33.6  30.0 - 36.0 g/dL   RDW 96.0  45.4 - 09.8 %   Platelets 162  150 - 400 K/uL  PRO B NATRIURETIC PEPTIDE     Status: Abnormal   Collection Time    06/23/13  3:04 AM      Result Value Range   Pro B Natriuretic peptide (BNP) 5146.0 (*) 0 - 450 pg/mL  PROCALCITONIN     Status: None   Collection Time    06/23/13  3:04 AM      Result Value Range   Procalcitonin 0.13     Comment:            Interpretation:     PCT (Procalcitonin) <= 0.5 ng/mL:     Systemic infection (sepsis) is not likely.     Local bacterial infection is possible.     (NOTE)             ICU PCT Algorithm               Non ICU PCT Algorithm        ----------------------------     ------------------------------             PCT < 0.25 ng/mL                 PCT < 0.1 ng/mL         Stopping of antibiotics            Stopping of antibiotics           strongly encouraged.               strongly encouraged.         ----------------------------     ------------------------------           PCT level decrease by               PCT < 0.25 ng/mL           >= 80% from peak PCT           OR PCT 0.25 - 0.5 ng/mL          Stopping of antibiotics                                                 encouraged.         Stopping of antibiotics               encouraged.        ----------------------------     ------------------------------           PCT level decrease by              PCT >= 0.25 ng/mL           < 80% from peak PCT            AND PCT >= 0.5 ng/mL            Continuing antibiotics                                                  encouraged.           Continuing antibiotics  encouraged.        ----------------------------     ------------------------------         PCT level increase compared          PCT > 0.5 ng/mL             with peak PCT AND              PCT >= 0.5 ng/mL             Escalation of antibiotics                                              strongly encouraged.          Escalation of antibiotics            strongly encouraged.  PROTIME-INR     Status: None   Collection Time    06/23/13  9:50 AM      Result Value Range   Prothrombin Time 13.0  11.6 - 15.2 seconds   INR 1.00  0.00 - 1.49   Dg Chest Port 1 View  06/23/2013   *RADIOLOGY REPORT*  Clinical Data: Respiratory failure.  PORTABLE CHEST - 1 VIEW  Comparison: 06/21/2013  Findings: Diffuse peribronchial thickening and interstitial prominence, increased since prior study.  Heart is borderline in size.  No visible effusions or acute bony abnormality.  IMPRESSION: Worsening peribronchial thickening and interstitial prominence.   Original Report Authenticated By: Charlett Nose, M.D.   Dg Chest Port 1 View  06/21/2013   *RADIOLOGY REPORT*  Clinical Data: Check for infiltrates.  Low O2 sats.  PORTABLE CHEST - 1 VIEW  Comparison: None.  Findings: Mild peribronchial thickening.  Patchy bilateral airspace opacities.  It is  unclear this represents chronic lung disease or patchy acute infiltrates.  Left base atelectasis or scarring. Heart is borderline in size.  No significant effusions.  No acute bony abnormality.  IMPRESSION: Diffuse peribronchial thickening with patchy bilateral airspace opacities, question chronic lung disease versus acute infiltrates.   Original Report Authenticated By: Charlett Nose, M.D.    Post Admission Physician Evaluation: 1. Functional deficits secondary  to right subcapital hip fx s/p ORIF. 2. Patient is admitted to receive collaborative, interdisciplinary care between the physiatrist, rehab nursing staff, and therapy team. 3. Patient's level of medical complexity and substantial therapy needs in context of that medical necessity cannot be provided at a lesser intensity of care such as a SNF. 4. Patient has experienced substantial functional loss from his/her baseline which was documented above under the "Functional History" and "Functional Status" headings.  Judging by the patient's diagnosis, physical exam, and functional history, the patient has potential for functional progress which will result in measurable gains while on inpatient rehab.  These gains will be of substantial and practical use upon discharge  in facilitating mobility and self-care at the household level. 5. Physiatrist will provide 24 hour management of medical needs as well as oversight of the therapy plan/treatment and provide guidance as appropriate regarding the interaction of the two. 6. 24 hour rehab nursing will assist with bladder management, bowel management, safety, skin/wound care, disease management, medication administration, pain management and patient education  and help integrate therapy concepts, techniques,education, etc. 7. PT will assess and treat for/with: Lower extremity strength, range of motion, stamina, balance, functional mobility, safety, adaptive techniques and equipment, pain mgt, ortho  precautions,  education.   Goals are: supervision to minimal assist. 8. OT will assess and treat for/with: ADL's, functional mobility, safety, upper extremity strength, adaptive techniques and equipment, pain mgt, ortho precautions, education.   Goals are: supervision to min assist. 9. SLP will assess and treat for/with: n/a .  Goals are: n/a. 10. Case Management and Social Worker will assess and treat for psychological issues and discharge planning. 11. Team conference will be held weekly to assess progress toward goals and to determine barriers to discharge. 12. Patient will receive at least 3 hours of therapy per day at least 5 days per week. 13. ELOS: 2.5 to 3 weeks       14. Prognosis:  good   Medical Problem List and Plan: 1. Right subcapital femur fracture. Status post ORIF 06/18/2013 at [facility]. Partial weightbearing right lower extremity 2. DVT Prophylaxis/Anticoagulation: Continue Xarelto for deep vein thrombosis right gastrocnemius vein 3. Pain Management: Neurontin 200 mg 3 times daily, oxycodone immediate release 5 mg every 6 hours as needed. Monitor mental status 4. Mood/delirium/confusion. Limit narcotics as much as possible. Recent cranial CT scan negative. Mental status continues to improve and she appeared fairly functional today. Continue Klonopin 1 mg each bedtime and Cymbalta 60 mg daily. Provide emotional support 5. Neuropsych: This patient is capable of making decisions on her own behalf. 6. Acute blood loss anemia. Followup CBC. Patient is Jehovah witness---no blood products. Continue iron supplement 7. Hypertension. Clonidine patch 0.1 mg weekly, Vasotec 20 mg daily, Plendil 5 mg daily. Monitor with increased mobility 8. Parkinson's disease versus movement disorder. Sinemet discontinued secondary to possible contributor of confusion 9. Diet controlled diabetes mellitus. Check blood sugars a.c. and at bedtime 10. Pneumonia. All antibiotics since discontinued. Oxygen  saturations greater than 90% on room air. Latest chest x-ray chronic changes no visible effusions or acute abnormality           Ranelle Oyster, MD, [facility] Physical Medicine & Rehabilitation   06/23/2013

## 2013-06-23 NOTE — Consult Note (Signed)
Physical Medicine and Rehabilitation Consult Reason for Consult: Poor mobility after femur fracture Referring Physician: Shan Levans MD   HPI: Kim Nichols is a 79 y.o. right-handed female with history of Parkinson's disease. Patient with recent right subcapital femur fracture at [facility] and underwent ORIF 06/18/2013. Postoperatively she developed acute encephalopathy with fever and received antibiotics for suspect pneumonia/UTI. She was transferred to [facility] on 6/23 for ongoing medical care. Lower tremor he Dopplers consistent with deep vein thrombosis involving the right gastrocnemius vein. Patient maintained on broad-spectrum antibiotics for pneumonia/urinary tract infection. Echocardiogram with ejection fraction of 65% and normal systolic function. Chest x-ray completed showing diffuse peribronchial thickening with patchy bilateral airspace opacities question chronic lung disease versus acute infiltrates. Patient currently maintained on Lovenox 70 mg every 12 hours for DVT and Coumadin initiated 06/23/2013. Noted delirium/confusion felt to be multifactorial in nature. Her Sinemet was discontinued per critical care medicine as this had recently been initiated prior to admission. Acute blood loss anemia after hip fracture latest hemoglobin of 8.0 and monitored. Physical and occupational therapy evaluations pending. Await recommendations on weightbearing status after hip surgery. M.D. is requested physical medicine rehabilitation consult to consider inpatient rehabilitation services  2 daughters in the room. State patient was independent at home prior to admission and driving. Patient currently thinks it's nighttime even though she looks out window at the sunshine  Review of Systems  Gastrointestinal:       GERD  Genitourinary:       Occasional incontinence of urine  Musculoskeletal: Positive for myalgias and falls.  Psychiatric/Behavioral:       Anxiety  All other  systems reviewed and are negative.   Past Medical History  Diagnosis Date  . Hypertension   . Diabetes mellitus without complication   . Dyslipidemia   . History of gastroesophageal reflux (GERD)   . Osteoarthritis   . Anxiety disorder   . Chronic insomnia   . Incontinence of urine    Past Surgical History  Procedure Laterality Date  . Knee surgery  4-11   History reviewed. No pertinent family history. Social History:  reports that she has never smoked. She has never used smokeless tobacco. She reports that she does not drink alcohol or use illicit drugs. Allergies:  Allergies  Allergen Reactions  . Aleve (Naproxen Sodium) Itching   Medications Prior to Admission  Medication Sig Dispense Refill  . carbidopa-levodopa (SINEMET IR) 25-100 MG per tablet Take 1 tablet by mouth 3 (three) times daily.  60 tablet  2  . cloNIDine (CATAPRES) 0.1 MG tablet Take 0.1 mg by mouth at bedtime.       . DULoxetine (CYMBALTA) 60 MG capsule Take 60 mg by mouth daily.       . enalapril (VASOTEC) 20 MG tablet Take 20 mg by mouth daily.       Marland Kitchen esomeprazole (NEXIUM) 40 MG capsule Take 40 mg by mouth daily before breakfast.      . felodipine (PLENDIL) 5 MG 24 hr tablet Take 5 mg by mouth daily.       Marland Kitchen gabapentin (NEURONTIN) 300 MG capsule Take 300 mg by mouth 3 (three) times daily.       Marland Kitchen HYDROcodone-acetaminophen (NORCO) 7.5-325 MG per tablet Take 1 tablet by mouth daily as needed for pain.       Marland Kitchen LORazepam (ATIVAN) 1 MG tablet Take 1 mg by mouth 2 (two) times daily.       . Multiple Vitamin (MULTIVITAMIN WITH MINERALS)  TABS Take 1 tablet by mouth daily. Vision Formula with Lutein      . simvastatin (ZOCOR) 40 MG tablet Take 40 mg by mouth at bedtime.       . Calcium Carbonate-Vitamin D (CALCIUM 600 + D PO) Take 600 mg by mouth once. Take 2 tabs once a day      . zolpidem (AMBIEN) 10 MG tablet Take 10 mg by mouth at bedtime.         Home: Home Living Family/patient expects to be discharged  to:: Private residence Living Arrangements: Alone  Functional History:   Functional Status:  Mobility:          ADL:    Cognition: Cognition Orientation Level: Oriented X4    Blood pressure 167/60, pulse 80, temperature 98 F (36.7 C), temperature source Oral, resp. rate 18, height 5\' 4"  (1.626 m), weight 69 kg (152 lb 1.9 oz), SpO2 98.00%. Physical Exam  Vitals reviewed. Constitutional: She appears well-developed.  HENT:  Head: Normocephalic.  Eyes: EOM are normal.  Neck: Neck supple. No thyromegaly present.  Cardiovascular: Normal rate and regular rhythm.   Pulmonary/Chest: Effort normal and breath sounds normal.  Abdominal: Soft. Bowel sounds are normal. She exhibits no distension.  Neurological: She is alert.  Patient was able to provide her name, place, date of birth and age. She followed simple commands  Skin:  Hip incision healing   oriented to hospital in [place] but not [place], day/night confusion Motor strength is 5/5 bilateral deltoid, biceps, triceps, grip Right lower extremity 3 minus hip flexor knee extensor ankle dorsiflexor and plantar flexor are 4 5/5 in the left hip flexor knee extensor ankle dorsiflexor and plantar flexor Babinski's are downgoing No evidence of cogwheel rigidity in the upper extremities. Visual fields appear intact   Results for orders placed during the hospital encounter of 06/21/13 (from the past 24 hour(s))  BASIC METABOLIC PANEL     Status: Abnormal   Collection Time    06/23/13  3:04 AM      Result Value Range   Sodium 137  135 - 145 mEq/L   Potassium 3.0 (*) 3.5 - 5.1 mEq/L   Chloride 99  96 - 112 mEq/L   CO2 34 (*) 19 - 32 mEq/L   Glucose, Bld 135 (*) 70 - 99 mg/dL   BUN 12  6 - 23 mg/dL   Creatinine, Ser 4.09  0.50 - 1.10 mg/dL   Calcium 8.1 (*) 8.4 - 10.5 mg/dL   GFR calc non Af Amer 80 (*) >90 mL/min   GFR calc Af Amer >90  >90 mL/min  CBC     Status: Abnormal   Collection Time    06/23/13  3:04 AM       Result Value Range   WBC 5.3  4.0 - 10.5 K/uL   RBC 2.67 (*) 3.87 - 5.11 MIL/uL   Hemoglobin 8.0 (*) 12.0 - 15.0 g/dL   HCT 81.1 (*) 91.4 - 78.2 %   MCV 89.1  78.0 - 100.0 fL   MCH 30.0  26.0 - 34.0 pg   MCHC 33.6  30.0 - 36.0 g/dL   RDW 95.6  21.3 - 08.6 %   Platelets 162  150 - 400 K/uL  PRO B NATRIURETIC PEPTIDE     Status: Abnormal   Collection Time    06/23/13  3:04 AM      Result Value Range   Pro B Natriuretic peptide (BNP) 5146.0 (*) 0 - 450 pg/mL  PROCALCITONIN     Status: None   Collection Time    06/23/13  3:04 AM      Result Value Range   Procalcitonin 0.13     Dg Chest Port 1 View  06/23/2013   *RADIOLOGY REPORT*  Clinical Data: Respiratory failure.  PORTABLE CHEST - 1 VIEW  Comparison: 06/21/2013  Findings: Diffuse peribronchial thickening and interstitial prominence, increased since prior study.  Heart is borderline in size.  No visible effusions or acute bony abnormality.  IMPRESSION: Worsening peribronchial thickening and interstitial prominence.   Original Report Authenticated By: Charlett Nose, M.D.   Dg Chest Port 1 View  06/21/2013   *RADIOLOGY REPORT*  Clinical Data: Check for infiltrates.  Low O2 sats.  PORTABLE CHEST - 1 VIEW  Comparison: None.  Findings: Mild peribronchial thickening.  Patchy bilateral airspace opacities.  It is unclear this represents chronic lung disease or patchy acute infiltrates.  Left base atelectasis or scarring. Heart is borderline in size.  No significant effusions.  No acute bony abnormality.  IMPRESSION: Diffuse peribronchial thickening with patchy bilateral airspace opacities, question chronic lung disease versus acute infiltrates.   Original Report Authenticated By: Charlett Nose, M.D.    Assessment/Plan: Diagnosis: Right subcapital femur fracture status post ORIF 06/18/2013 with postoperative complications 1. Does the need for close, 24 hr/day medical supervision in concert with the patient's rehab needs make it unreasonable for this  patient to be served in a less intensive setting? Yes 2. Co-Morbidities requiring supervision/potential complications: Acute blood loss anemia, deep venous thrombosis right calf, hypertension, pneumonia with respiratory failure improving 3. Due to bladder management, bowel management, safety, skin/wound care, disease management, medication administration, pain management and patient education, does the patient require 24 hr/day rehab nursing? Yes 4. Does the patient require coordinated care of a physician, rehab nurse, PT (1-2 hrs/day, 5 days/week) and OT (1-2 hrs/day, 5 days/week) to address physical and functional deficits in the context of the above medical diagnosis(es)? Yes Addressing deficits in the following areas: balance, endurance, locomotion, strength, transferring, bowel/bladder control, bathing, dressing, feeding, grooming, toileting, cognition and speech 5. Can the patient actively participate in an intensive therapy program of at least 3 hrs of therapy per day at least 5 days per week? Yes 6. The potential for patient to make measurable gains while on inpatient rehab is good 7. Anticipated functional outcomes upon discharge from inpatient rehab are supervision mobility with PT, supervision ADLs with OT, 100% orientation, modified independent medication management with SLP. 8. Estimated rehab length of stay to reach the above functional goals is: 2 weeks 9. Does the patient have adequate social supports to accommodate these discharge functional goals? Yes 10. Anticipated D/C setting: Home 11. Anticipated post D/C treatments: HH therapy 12. Overall Rehab/Functional Prognosis: excellent  RECOMMENDATIONS: This patient's condition is appropriate for continued rehabilitative care in the following setting: CIR Patient has agreed to participate in recommended program. Potentially Note that insurance prior authorization may be required for reimbursement for recommended care.  Comment:  Confusion should clear up with medication adjustment, consider discontinuation of Duragesic patch    06/23/2013

## 2013-06-23 NOTE — Progress Notes (Addendum)
ANTICOAGULATION CONSULT NOTE - Initial Consult  Pharmacy Consult for warfarin Indication: DVT  Allergies  Allergen Reactions  . Aleve (Naproxen Sodium) Itching    Patient Measurements: Height: 5\' 4"  (162.6 cm) Weight: 152 lb 1.9 oz (69 kg) IBW/kg (Calculated) : 54.7  Vital Signs: Temp: 98 F (36.7 C) (06/25 0800) Temp src: Oral (06/25 0346) BP: 167/60 mmHg (06/25 1000) Pulse Rate: 80 (06/25 1000)  Labs:  Recent Labs  06/22/13 0215 06/23/13 0304  HGB 8.5* 8.0*  HCT 26.1* 23.8*  PLT 157 162  CREATININE 0.63 0.65    Estimated Creatinine Clearance: 51.7 ml/min (by C-G formula based on Cr of 0.65).   Medical History: Past Medical History  Diagnosis Date  . Hypertension   . Diabetes mellitus without complication   . Dyslipidemia   . History of gastroesophageal reflux (GERD)   . Osteoarthritis   . Anxiety disorder   . Chronic insomnia   . Incontinence of urine     Medications:  Prescriptions prior to admission  Medication Sig Dispense Refill  . carbidopa-levodopa (SINEMET IR) 25-100 MG per tablet Take 1 tablet by mouth 3 (three) times daily.  60 tablet  2  . cloNIDine (CATAPRES) 0.1 MG tablet Take 0.1 mg by mouth at bedtime.       . DULoxetine (CYMBALTA) 60 MG capsule Take 60 mg by mouth daily.       . enalapril (VASOTEC) 20 MG tablet Take 20 mg by mouth daily.       Marland Kitchen esomeprazole (NEXIUM) 40 MG capsule Take 40 mg by mouth daily before breakfast.      . felodipine (PLENDIL) 5 MG 24 hr tablet Take 5 mg by mouth daily.       Marland Kitchen gabapentin (NEURONTIN) 300 MG capsule Take 300 mg by mouth 3 (three) times daily.       Marland Kitchen HYDROcodone-acetaminophen (NORCO) 7.5-325 MG per tablet Take 1 tablet by mouth daily as needed for pain.       Marland Kitchen LORazepam (ATIVAN) 1 MG tablet Take 1 mg by mouth 2 (two) times daily.       . Multiple Vitamin (MULTIVITAMIN WITH MINERALS) TABS Take 1 tablet by mouth daily. Vision Formula with Lutein      . simvastatin (ZOCOR) 40 MG tablet Take 40 mg by  mouth at bedtime.       . Calcium Carbonate-Vitamin D (CALCIUM 600 + D PO) Take 600 mg by mouth once. Take 2 tabs once a day      . zolpidem (AMBIEN) 10 MG tablet Take 10 mg by mouth at bedtime.         Assessment: 84 yof started on lovenox for new DVT, now to begin warfarin. No INR available. CBC is stable, no bleeding noted.   Goal of Therapy:  INR 2-3   Plan:  1. INR now and daily 2. Coumadin 5mg  PO x 1 tonight if INR is WNL  3. Coumadin education materials to patient  Kim Nichols, Kim Nichols 06/23/2013,10:09 AM  Addendum: Baseline INR is normal at 1. Will continue with previous plan of warfarin 5mg  PO x 1 tonight.  Lysle Pearl, PharmD, BCPS Pager # [phone] 06/23/2013 11:25 AM

## 2013-06-24 LAB — PROTIME-INR: Prothrombin Time: 12.8 seconds (ref 11.6–15.2)

## 2013-06-24 MED ORDER — SIMVASTATIN 20 MG PO TABS
20.0000 mg | ORAL_TABLET | Freq: Every day | ORAL | Status: DC
  Administered 2013-06-24: 20 mg via ORAL
  Filled 2013-06-24 (×2): qty 1

## 2013-06-24 MED ORDER — ENALAPRIL MALEATE 20 MG PO TABS
20.0000 mg | ORAL_TABLET | Freq: Every day | ORAL | Status: DC
  Administered 2013-06-24 – 2013-06-25 (×2): 20 mg via ORAL
  Filled 2013-06-24 (×2): qty 1

## 2013-06-24 MED ORDER — CLONAZEPAM 1 MG PO TABS: 1.0000 mg | ORAL_TABLET | Freq: Every day | ORAL | Status: DC

## 2013-06-24 MED ORDER — RIVAROXABAN 20 MG PO TABS: 20.0000 mg | ORAL_TABLET | Freq: Every day | ORAL | Status: DC

## 2013-06-24 MED ORDER — POLYSACCHARIDE IRON COMPLEX 150 MG PO CAPS: 150.0000 mg | ORAL_CAPSULE | Freq: Every day | ORAL | Status: DC

## 2013-06-24 MED ORDER — ACETAMINOPHEN 325 MG PO TABS: 650.0000 mg | ORAL_TABLET | Freq: Four times a day (QID) | ORAL | Status: DC | PRN

## 2013-06-24 MED ORDER — SENNOSIDES-DOCUSATE SODIUM 8.6-50 MG PO TABS: 1.0000 | ORAL_TABLET | Freq: Every day | ORAL | Status: DC

## 2013-06-24 MED ORDER — GABAPENTIN 300 MG PO CAPS
300.0000 mg | ORAL_CAPSULE | Freq: Three times a day (TID) | ORAL | Status: DC
  Administered 2013-06-24 – 2013-06-25 (×4): 300 mg via ORAL
  Filled 2013-06-24 (×6): qty 1

## 2013-06-24 MED ORDER — POLYSACCHARIDE IRON COMPLEX 150 MG PO CAPS
150.0000 mg | ORAL_CAPSULE | Freq: Every day | ORAL | Status: DC
  Administered 2013-06-24 – 2013-06-25 (×2): 150 mg via ORAL
  Filled 2013-06-24 (×2): qty 1

## 2013-06-24 MED ORDER — FELODIPINE ER 5 MG PO TB24
5.0000 mg | ORAL_TABLET | Freq: Every day | ORAL | Status: DC
  Administered 2013-06-24 – 2013-06-25 (×2): 5 mg via ORAL
  Filled 2013-06-24 (×2): qty 1

## 2013-06-24 MED ORDER — SENNOSIDES-DOCUSATE SODIUM 8.6-50 MG PO TABS
1.0000 | ORAL_TABLET | Freq: Every day | ORAL | Status: DC
  Administered 2013-06-24: 1 via ORAL
  Filled 2013-06-24 (×2): qty 1

## 2013-06-24 MED ORDER — RIVAROXABAN 15 MG PO TABS: 15.0000 mg | ORAL_TABLET | Freq: Two times a day (BID) | ORAL | Status: DC

## 2013-06-24 MED ORDER — PANTOPRAZOLE SODIUM 40 MG PO TBEC
40.0000 mg | DELAYED_RELEASE_TABLET | Freq: Every day | ORAL | Status: DC
  Administered 2013-06-24 – 2013-06-25 (×2): 40 mg via ORAL
  Filled 2013-06-24 (×2): qty 1

## 2013-06-24 MED ORDER — RIVAROXABAN 15 MG PO TABS
15.0000 mg | ORAL_TABLET | Freq: Two times a day (BID) | ORAL | Status: DC
  Administered 2013-06-24 – 2013-06-25 (×2): 15 mg via ORAL
  Filled 2013-06-24 (×4): qty 1

## 2013-06-24 MED ORDER — OXYCODONE HCL 5 MG PO TABS: 5.0000 mg | ORAL_TABLET | Freq: Four times a day (QID) | ORAL | Status: DC | PRN

## 2013-06-24 MED ORDER — CLONIDINE HCL 0.1 MG PO TABS
0.1000 mg | ORAL_TABLET | Freq: Every day | ORAL | Status: DC
  Administered 2013-06-24: 0.1 mg via ORAL
  Filled 2013-06-24 (×2): qty 1

## 2013-06-24 NOTE — Progress Notes (Signed)
Physical Therapy Treatment Patient Details Name: Kim Nichols MRN: [id number] DOB: [birth year] Today's Date: 06/24/2013 Time: 4098-1191 PT Time Calculation (min): 23 min  PT Assessment / Plan / Recommendation  PT Comments   Pt was educated on WB restrictions and was able to comply. Pt found in chair and very tired/lethargic but agreeable to PT. Pt was able to ambulate to the door of her room today before requiring a rest break. She was very weak and had a difficult time maintaining trunk extension. At this time, the patient will benefit from the current d/c plan in order to improve functional independence.  Follow Up Recommendations  CIR     Does the patient have the potential to tolerate intense rehabilitation     Barriers to Discharge        Equipment Recommendations  3in1 (PT)    Recommendations for Other Services Rehab consult  Frequency Min 5X/week   Progress towards PT Goals Progress towards PT goals: Progressing toward goals  Plan Current plan remains appropriate    Precautions / Restrictions Precautions Precautions: Fall Precaution Comments: h/o fall Restrictions Weight Bearing Restrictions: Yes RLE Weight Bearing: Partial weight bearing RLE Partial Weight Bearing Percentage or Pounds: 50% (per RN who states she spoke with MD via telephone for clarification of weightbearing order)  Pertinent Vitals/Pain Patient made no reports of pain.    Mobility  Transfers Transfers: Sit to Stand;Stand to Sit;Stand Pivot Transfers Sit to Stand: 3: Mod assist;With upper extremity assist;With armrests;From chair/3-in-1 Stand to Sit: 3: Mod assist;With upper extremity assist;With armrests;To bed;To chair/3-in-1 Stand Pivot Transfers: 3: Mod assist;From elevated surface;With armrests Details for Transfer Assistance: pt required physical assist to extend trunk when standing and VC for hand placement on RW and 3-in-1. Pt instructed to take weight evenly through UE instead of leaning to  left side.  Ambulation/Gait Ambulation/Gait Assistance: 3: Mod assist Ambulation Distance (Feet): 10 Feet Assistive device: Rolling walker Ambulation/Gait Assistance Details: Pt required mod assist for proper sequencing with RW. VC needed to extend trunk, retract shoulders, and center LOG (leaning toward the left) inside of RW. Pt required recliner to follow for rest break.  Gait Pattern: Step-to pattern;Decreased stride length;Decreased hip/knee flexion - right;Decreased dorsiflexion - right Gait velocity: decreased Stairs: No Wheelchair Mobility Wheelchair Mobility: No    Exercises General Exercises - Lower Extremity Long Arc Quad: AAROM;Right;10 reps;Seated Hip Flexion/Marching: AAROM;Right;10 reps;Seated Toe Raises: AROM;Both;10 reps;Seated Heel Raises: AROM;Both;10 reps;Seated        PT Goals (current goals can now be found in the care plan section)    Visit Information  Last PT Received On: 06/24/13 Assistance Needed: +2 (For safety and equipment) History of Present Illness:  79 yo with history of hypertension, suspected Parkinson's disease (recently started on Sinemet) and remote DVT admitted to [facility] on 6/20 with R hip fx. Underwent ORIF. Postoperatively she developed acute encephalopathy and fever. Received abx for suspected PNA / UTI.     Subjective Data      Cognition  Cognition Arousal/Alertness: Lethargic Behavior During Therapy: WFL for tasks assessed/performed Overall Cognitive Status: Within Functional Limits for tasks assessed    Balance     End of Session PT - End of Session Equipment Utilized During Treatment: Gait belt Activity Tolerance: Patient limited by fatigue Patient left: in chair;with call bell/phone within reach Nurse Communication: Mobility status;Weight bearing status   GP     Jolyn Nap, SPTA 06/24/2013, 11:02 AM   Agree with above Verdell Face, PTA [phone] 06/24/2013

## 2013-06-24 NOTE — Progress Notes (Signed)
Insurance will authorize pt for CIR w/ admit tomorrow 06/25/13.  Dr. Donette Larry is aware.  Also, pt & her family have been told.  Lutricia Horsfall [phone] will f/up tomorrow.

## 2013-06-24 NOTE — Progress Notes (Addendum)
ANTICOAGULATION CONSULT NOTE   Pharmacy Consult for warfarin, lovenox Indication: DVT  Allergies  Allergen Reactions  . Aleve (Naproxen Sodium) Itching    Patient Measurements: Height: 5\' 4"  (162.6 cm) Weight: 146 lb 2.6 oz (66.3 kg) (Scale B) IBW/kg (Calculated) : 54.7  Vital Signs: Temp: 97.3 F (36.3 C) (06/26 0530) Temp src: Oral (06/26 0530) BP: 150/67 mmHg (06/26 0530) Pulse Rate: 73 (06/26 0530)  Labs:  Recent Labs  06/22/13 0215 06/23/13 0304 06/23/13 0950 06/24/13 0613  HGB 8.5* 8.0*  --   --   HCT 26.1* 23.8*  --   --   PLT 157 162  --   --   LABPROT  --   --  13.0 12.8  INR  --   --  1.00 0.98  CREATININE 0.63 0.65  --   --     Estimated Creatinine Clearance: 50.8 ml/min (by C-G formula based on Cr of 0.65).   Medical History: Past Medical History  Diagnosis Date  . Hypertension   . Diabetes mellitus without complication   . Dyslipidemia   . History of gastroesophageal reflux (GERD)   . Osteoarthritis   . Anxiety disorder   . Chronic insomnia   . Incontinence of urine     Medications:  Prescriptions prior to admission  Medication Sig Dispense Refill  . carbidopa-levodopa (SINEMET IR) 25-100 MG per tablet Take 1 tablet by mouth 3 (three) times daily.  60 tablet  2  . cloNIDine (CATAPRES) 0.1 MG tablet Take 0.1 mg by mouth at bedtime.       . DULoxetine (CYMBALTA) 60 MG capsule Take 60 mg by mouth daily.       . enalapril (VASOTEC) 20 MG tablet Take 20 mg by mouth daily.       Marland Kitchen esomeprazole (NEXIUM) 40 MG capsule Take 40 mg by mouth daily before breakfast.      . felodipine (PLENDIL) 5 MG 24 hr tablet Take 5 mg by mouth daily.       Marland Kitchen gabapentin (NEURONTIN) 300 MG capsule Take 300 mg by mouth 3 (three) times daily.       Marland Kitchen HYDROcodone-acetaminophen (NORCO) 7.5-325 MG per tablet Take 1 tablet by mouth daily as needed for pain.       Marland Kitchen LORazepam (ATIVAN) 1 MG tablet Take 1 mg by mouth 2 (two) times daily.       . Multiple Vitamin (MULTIVITAMIN  WITH MINERALS) TABS Take 1 tablet by mouth daily. Vision Formula with Lutein      . simvastatin (ZOCOR) 40 MG tablet Take 40 mg by mouth at bedtime.       . Calcium Carbonate-Vitamin D (CALCIUM 600 + D PO) Take 600 mg by mouth once. Take 2 tabs once a day      . zolpidem (AMBIEN) 10 MG tablet Take 10 mg by mouth at bedtime.         Assessment: 35 yof started on lovenox/warfarin for new DVT.  This will be Day 2 overlap.  No bleeding noted.   Goal of Therapy:  INR 2-3   Plan:  1. Coumadin 5mg  PO x 1 tonight  2.  Cont lovenox 3. F/u coumadin education  Farheen Pfahler [place] 06/24/2013,7:53 AM  Addum:  Changed to xarelto

## 2013-06-24 NOTE — Evaluation (Signed)
Occupational Therapy Evaluation Patient Details Name: Kim Nichols MRN: [id number] DOB: 1929/04/27 Today's Date: 06/24/2013 Time: 8295-6213 OT Time Calculation (min): 21 min  OT Assessment / Plan / Recommendation History of present illness  79 yo with history of hypertension, suspected Parkinson's disease (recently started on Sinemet) and remote DVT admitted to [facility] on 6/20 with R hip fx. Underwent ORIF. Postoperatively she developed acute encephalopathy and fever. Received abx for suspected PNA / UTI.    Clinical Impression   Pt s/p ORIF for Right hip fx thus affecting PLOF. Will benefit from continued acute OT services to address below problem list. Recommending CIR to further progress rehab before eventual return home.     OT Assessment  Patient needs continued OT Services    Follow Up Recommendations  CIR    Barriers to Discharge      Equipment Recommendations  3 in 1 bedside comode    Recommendations for Other Services Rehab consult  Frequency  Min 2X/week    Precautions / Restrictions Precautions Precautions: Fall Precaution Comments: h/o fall Restrictions Weight Bearing Restrictions: Yes RLE Weight Bearing: Partial weight bearing RLE Partial Weight Bearing Percentage or Pounds: 50%   Pertinent Vitals/Pain See vitals    ADL  Eating/Feeding: Independent Where Assessed - Eating/Feeding: Chair Grooming: Teeth care;Brushing hair;Wash/dry face;Set up Where Assessed - Grooming: Supported sitting Upper Body Bathing: Set up Where Assessed - Upper Body Bathing: Supported sitting Lower Body Bathing: Maximal assistance Where Assessed - Lower Body Bathing: Supported sit to stand Upper Body Dressing: Set up Where Assessed - Upper Body Dressing: Supported sitting Lower Body Dressing: +1 Total assistance Where Assessed - Lower Body Dressing: Supported sit to Pharmacist, hospital: Moderate assistance Toilet Transfer Method: Sit to Writer:  Nurse, children's) Toileting - Architect and Hygiene: Maximal assistance Where Assessed - Engineer, mining and Hygiene: Standing Equipment Used: Gait belt;Rolling walker Transfers/Ambulation Related to ADLs: Mod assist for sit<>stand x2 trials at recliner.  ADL Comments: Pt limited with LB tasks and with functional mobility due to R hip pain/weakness.  Pt reporting she feels very weak.  Although lethargic during session, pt became more alert during grooming tasks.     OT Diagnosis: Generalized weakness;Acute pain  OT Problem List: Decreased strength;Decreased activity tolerance;Impaired balance (sitting and/or standing);Decreased knowledge of use of DME or AE;Decreased knowledge of precautions;Pain OT Treatment Interventions: Self-care/ADL training;DME and/or AE instruction;Therapeutic activities;Balance training;Patient/family education;Therapeutic exercise   OT Goals(Current goals can be found in the care plan section) Acute Rehab OT Goals Patient Stated Goal: to be independent again OT Goal Formulation: With patient Time For Goal Achievement: 07/08/13 Potential to Achieve Goals: Good  Visit Information  Last OT Received On: 06/24/13 Assistance Needed: +2 (for safety with ambulation) History of Present Illness:  79 yo with history of hypertension, suspected Parkinson's disease (recently started on Sinemet) and remote DVT admitted to [facility] on 6/20 with R hip fx. Underwent ORIF. Postoperatively she developed acute encephalopathy and fever. Received abx for suspected PNA / UTI.        Prior Functioning     Home Living Family/patient expects to be discharged to:: Inpatient rehab Living Arrangements: Alone Additional Comments: lived alone PTA, drives, has a cane, RW, bath chair with a back.   Prior Function Level of Independence: Independent with assistive device(s) Comments: independent at times used cane, did her own cooking and  cleaning and bathing.   Communication Communication: No difficulties Dominant Hand: Right  Vision/Perception     Cognition  Cognition Arousal/Alertness: Lethargic Behavior During Therapy: WFL for tasks assessed/performed Overall Cognitive Status: Within Functional Limits for tasks assessed    Extremity/Trunk Assessment Upper Extremity Assessment Upper Extremity Assessment: Generalized weakness     Mobility Bed Mobility Bed Mobility: Not assessed Details for Bed Mobility Assistance: pt up in chair Transfers Transfers: Sit to Stand;Stand to Sit Sit to Stand: 3: Mod assist;With upper extremity assist;With armrests;From chair/3-in-1 Stand to Sit: 3: Mod assist;To chair/3-in-1;With armrests;With upper extremity assist Details for Transfer Assistance: Assist for power up and for steadying/balance immediately upon standing due to posterior lean against chair.      Exercise    Balance Balance Balance Assessed: Yes Static Standing Balance Static Standing - Balance Support: Bilateral upper extremity supported (UEs supported on RW) Static Standing - Level of Assistance: 4: Min assist Static Standing - Comment/# of Minutes: stood for ~30 sec x2 trials   End of Session OT - End of Session Equipment Utilized During Treatment: Gait belt;Rolling walker Activity Tolerance: Patient limited by fatigue;Patient limited by lethargy (fatigue from just finishing with PT session) Patient left: in chair;with call bell/phone within reach;with nursing/sitter in room Nurse Communication: Mobility status  GO    06/24/2013 Cipriano Mile OTR/L Pager [phone] Office [phone]  Cipriano Mile 06/24/2013, 11:25 AM

## 2013-06-24 NOTE — Progress Notes (Signed)
Client is not having pain at this time.  Resting quietly in bed.  Tolerated being up without any issues.

## 2013-06-24 NOTE — Progress Notes (Signed)
Rehab Admissions Coordinator Note:  Patient was screened by Brock Ra for appropriateness for an Inpatient Acute Rehab Consult.  An Inpatient Rehab consult has been completed.  Orders for OT eval & for WB guidelines have been requested.  Melanee Spry S 06/24/2013, 8:04 AM  I can be reached at [phone].

## 2013-06-24 NOTE — Progress Notes (Signed)
Subjective: Pt A & O x3 Some hip pain   Objective: Vital signs in last 24 hours: Temp:  [97.3 F (36.3 C)-98.4 F (36.9 C)] 97.3 F (36.3 C) (06/26 0530) Pulse Rate:  [65-90] 73 (06/26 0530) Resp:  [17-24] 19 (06/26 0530) BP: (146-179)/(52-91) 150/67 mmHg (06/26 0530) SpO2:  [95 %-100 %] 99 % (06/26 0530) Weight:  [66.3 kg (146 lb 2.6 oz)-67.4 kg (148 lb 9.4 oz)] 66.3 kg (146 lb 2.6 oz) (06/26 0530) Weight change: -1.6 kg (-3 lb 8.4 oz) Last BM Date: 06/23/13  Intake/Output from previous day: 06/25 0701 - 06/26 0700 In: 710 [P.O.:510; IV Piggyback:200] Out: 2451 [Urine:2450; Stool:1] Intake/Output this shift:    General appearance: alert Resp: clear to auscultation bilaterally GI: soft, non-tender; bowel sounds normal; no masses,  no organomegaly Extremities: right hip surgery site clean with staple  Lab Results:  Recent Labs  06/22/13 0215 06/23/13 0304  WBC 6.0 5.3  HGB 8.5* 8.0*  HCT 26.1* 23.8*  PLT 157 162   BMET  Recent Labs  06/22/13 0215 06/23/13 0304  NA 141 137  K 3.5 3.0*  CL 102 99  CO2 35* 34*  GLUCOSE 134* 135*  BUN 12 12  CREATININE 0.63 0.65  CALCIUM 8.5 8.1*    Studies/Results: Dg Chest Port 1 View  06/23/2013   *RADIOLOGY REPORT*  Clinical Data: Respiratory failure.  PORTABLE CHEST - 1 VIEW  Comparison: 06/21/2013  Findings: Diffuse peribronchial thickening and interstitial prominence, increased since prior study.  Heart is borderline in size.  No visible effusions or acute bony abnormality.  IMPRESSION: Worsening peribronchial thickening and interstitial prominence.   Original Report Authenticated By: Charlett Nose, M.D.    Medications: I have reviewed the patient's current medications.  Assessment/Plan: Acute Delerium- multiple factor after hip surgery, medication effect- now resolved- MS - clear- with baseline No PNA- chronic changes Recent start of sinemet- ? Parkinson- - will d/c for now- ? Contribution to confusion HTN:  restart home med Major Depression: continue Cymbalta, klonapin RLE DVT:  Will start on xarelto; d/u Lovanox and Coumadin R hip surgerey- PT therapy; partial weight bearing - Ortho f//u in 2 weeks- staple in place Pain control with Oxycodone. D/c duragesic patch Anemia- pt with Jehovah witness-  Start on iron daily Post herpetic neuralgia- continue neurontin at home dose  [facility] for CIR - medically stable  LOS: 3 days   Nikole Swartzentruber 06/24/2013, 7:51 AM

## 2013-06-24 NOTE — PMR Pre-admission (Signed)
PMR Admission Coordinator Pre-Admission Assessment  Patient: Kim Nichols is an 79 y.o., female MRN: [id number] DOB: 02-05-1929 Height: 5\' 4"  (162.6 cm) Weight: 66.3 kg (146 lb 2.6 oz) (Scale B)              Insurance Information HMO:      PPO: yes     PCP:       IPA:       80/20:       OTHER:   PRIMARY:  A[employer] Medicare Complete      Policy#: [id number]      Subscriber: pt CM Name: Pennelope Bracken      Phone#: [phone]      Pre-Cert#: [id number]      Employer: retired Benefits:  Phone #: [phone]     Name: Carie Caddy. Date: 09-29-12     Deduct: 0      Out of Pocket Max: $4500.00 [met $58.14]      Life Max: 0 CIR: $295.00/day days 1-5      SNF: $25.00/day days 1-20, $152.00/day days 21-47, 0 days 48-100 Outpatient: PT & ST combine     Co-Pay: $40.00/visit [if PT & ST are same day=only one copay] Home Health: 100%      Co-Pay: 20% supplies used DME: 80%     Co-Pay: 20% Providers: [facility] network     Emergency Contact Information Contact Information   Name Relation Home Work Mobile   Wood,Janie daughter [phone]   Lajoyce Lauber daughter   [phone]     Current Medical History  Patient Admitting Diagnosis:   Right subcapital femur fracture status post ORIF 06/18/2013 with postoperative complications  History of Present Illness:   79 y.o. right-handed female with history of Parkinson's disease. Patient with recent right subcapital femur fracture at [facility] and underwent ORIF 06/18/2013. Postoperatively she developed acute encephalopathy with fever and received antibiotics for suspect pneumonia/UTI. She was transferred to [facility] on 6/23 for ongoing medical care. Lower extremity Dopplers consistent with deep vein thrombosis involving the right gastrocnemius vein. Patient maintained on broad-spectrum antibiotics for pneumonia/urinary tract infection. Echocardiogram with ejection fraction of 65% and normal systolic function. Chest x-ray  completed showing diffuse peribronchial thickening with patchy bilateral airspace opacities question chronic lung disease versus acute infiltrates. Patient currently maintained on Lovenox 70 mg every 12 hours for DVT and Coumadin initiated 06/23/2013. Noted delirium/confusion felt to be multifactorial in nature. Her Sinemet was discontinued per critical care medicine as this had recently been initiated prior to admission. Acute blood loss anemia after hip fracture latest hemoglobin of 8.0 and monitored. Physical and occupational therapy evaluations pending. Await recommendations on weightbearing status after hip surgery. M.D. is requested physical medicine rehabilitation consult to consider inpatient rehabilitation services  2 daughters in the room. State patient was independent at home prior to admission and driving       Past Medical History  Past Medical History  Diagnosis Date  . Hypertension   . Diabetes mellitus without complication   . Dyslipidemia   . History of gastroesophageal reflux (GERD)   . Osteoarthritis   . Anxiety disorder   . Chronic insomnia   . Incontinence of urine     Family History  family history is not on file.  Prior Rehab/Hospitalizations: some HH after knee surgery about 2 yrs ago-can't remember agency   Current Medications  Current facility-administered medications:acetaminophen (TYLENOL) tablet 650 mg, 650 mg, Oral, Q6H PRN, Eduard Clos, MD, 650 mg at 06/23/13 2031;  antiseptic oral rinse (BIOTENE) solution 15 mL, 15 mL, Mouth Rinse, q12n4p, Storm Frisk, MD, 15 mL at 06/24/13 1200;  chlorhexidine (PERIDEX) 0.12 % solution 15 mL, 15 mL, Mouth Rinse, BID, Storm Frisk, MD, 15 mL at 06/24/13 1109 clonazePAM (KLONOPIN) tablet 1 mg, 1 mg, Oral, QHS, Merwyn Katos, MD, 1 mg at 06/23/13 2212;  cloNIDine (CATAPRES) tablet 0.1 mg, 0.1 mg, Oral, QHS, Georgann Housekeeper, MD;  dextrose 5 % and 0.45 % NaCl with KCl 40 mEq/L infusion, , Intravenous, Continuous,  Merwyn Katos, MD, Last Rate: 10 mL/hr at 06/23/13 1003, 10 mL/hr at 06/23/13 1003;  DULoxetine (CYMBALTA) DR capsule 60 mg, 60 mg, Oral, Daily, Merwyn Katos, MD, 60 mg at 06/24/13 1101 enalapril (VASOTEC) tablet 20 mg, 20 mg, Oral, Daily, Georgann Housekeeper, MD, 20 mg at 06/24/13 1105;  felodipine (PLENDIL) 24 hr tablet 5 mg, 5 mg, Oral, Daily, Georgann Housekeeper, MD, 5 mg at 06/24/13 1101;  gabapentin (NEURONTIN) capsule 300 mg, 300 mg, Oral, TID, Georgann Housekeeper, MD, 300 mg at 06/24/13 1105;  hydrALAZINE (APRESOLINE) injection 10 mg, 10 mg, Intravenous, Q4H PRN, Eduard Clos, MD, 10 mg at 06/23/13 2220 iron polysaccharides (NIFEREX) capsule 150 mg, 150 mg, Oral, Daily, Georgann Housekeeper, MD, 150 mg at 06/24/13 1106;  ondansetron (ZOFRAN) injection 4 mg, 4 mg, Intravenous, Q6H PRN, Eduard Clos, MD;  ondansetron (ZOFRAN) tablet 4 mg, 4 mg, Oral, Q6H PRN, Eduard Clos, MD;  oxyCODONE (Oxy IR/ROXICODONE) immediate release tablet 5 mg, 5 mg, Oral, Q6H PRN, Merwyn Katos, MD, 5 mg at 06/24/13 1610 pantoprazole (PROTONIX) EC tablet 40 mg, 40 mg, Oral, Daily, Georgann Housekeeper, MD, 40 mg at 06/24/13 1100;  Rivaroxaban (XARELTO) tablet 15 mg, 15 mg, Oral, BID WC, Georgann Housekeeper, MD;  Melene Muller ON 07/16/2013] Rivaroxaban (XARELTO) tablet 20 mg, 20 mg, Oral, Q supper, Georgann Housekeeper, MD;  senna-docusate (Senokot-S) tablet 1 tablet, 1 tablet, Oral, QHS, Georgann Housekeeper, MD;  simvastatin (ZOCOR) tablet 20 mg, 20 mg, Oral, q1800, Georgann Housekeeper, MD sodium chloride 0.9 % injection 3 mL, 3 mL, Intravenous, Q12H, Eduard Clos, MD, 3 mL at 06/24/13 1109  Patients Current Diet: Carb Control  Precautions / Restrictions Precautions Precautions: Fall Precaution Comments: h/o fall Restrictions Weight Bearing Restrictions: Yes RLE Weight Bearing: Partial weight bearing RLE Partial Weight Bearing Percentage or Pounds: 50%   Prior Activity Level  Independent Home Assistive Devices / Equipment Home Assistive  Devices/Equipment: None  Prior Functional Level Prior Function Level of Independence: Independent with assistive device(s) Comments: independent at times used cane, did her own cooking and cleaning and bathing.    Current Functional Level Cognition  Overall Cognitive Status: Within Functional Limits for tasks assessed Orientation Level: Oriented X4    Extremity Assessment (includes Sensation/Coordination)  Upper Extremity Assessment: Generalized weakness  Lower Extremity Assessment: RLE deficits/detail;LLE deficits/detail RLE Deficits / Details: right leg limited by pain and weakness due to post-op status.  Grossly 3-/5 throughout.   LLE Deficits / Details: grossly 4/5 throughout.      ADLs  Eating/Feeding: Independent Where Assessed - Eating/Feeding: Chair Grooming: Teeth care;Brushing hair;Wash/dry face;Set up Where Assessed - Grooming: Supported sitting Upper Body Bathing: Set up Where Assessed - Upper Body Bathing: Supported sitting Lower Body Bathing: Maximal assistance Where Assessed - Lower Body Bathing: Supported sit to stand Upper Body Dressing: Set up Where Assessed - Upper Body Dressing: Supported sitting Lower Body Dressing: +1 Total assistance Where Assessed - Lower Body Dressing: Supported sit to stand  Toilet Transfer: Moderate assistance Toilet Transfer Method: Sit to Barista:  Nurse, children's) Toileting - Architect and Hygiene: Maximal assistance Where Assessed - Engineer, mining and Hygiene: Standing Equipment Used: Gait belt;Rolling walker Transfers/Ambulation Related to ADLs: Mod assist for sit<>stand x2 trials at recliner.  ADL Comments: Pt limited with LB tasks and with functional mobility due to R hip pain/weakness.  Pt reporting she feels very weak.  Pt became more alert during grooming tasks.     Mobility  Bed Mobility: Not assessed Supine to Sit: 4: Min assist;With rails;HOB elevated Sitting - Scoot to  Edge of Bed: 4: Min assist;With rail    Transfers  Transfers: Sit to Stand;Stand to [employer] Transfers Sit to Stand: 3: Mod assist;With upper extremity assist;With armrests;From chair/3-in-1 Stand to Sit: 3: Mod assist;To chair/3-in-1;With armrests;With upper extremity assist Stand Pivot Transfers: 3: Mod assist;From elevated surface;With armrests    Ambulation / Gait / Stairs / Wheelchair Mobility  Ambulation/Gait Ambulation/Gait Assistance: 3: Mod assist Ambulation Distance (Feet): 10 Feet Assistive device: Rolling walker Ambulation/Gait Assistance Details: Pt required mod assist for proper sequencing with RW. VC needed to extend trunk, retract shoulders, and center LOG (leaning toward the left) inside of RW. Pt required recliner to follow for rest break.  Gait Pattern: Step-to pattern;Decreased stride length;Decreased hip/knee flexion - right;Decreased dorsiflexion - right Gait velocity: decreased Stairs: No Wheelchair Mobility Wheelchair Mobility: No    Posture / Balance Static Sitting Balance Static Sitting - Balance Support: Bilateral upper extremity supported;Feet supported Static Sitting - Level of Assistance: 5: Stand by assistance Static Sitting - Comment/# of Minutes: mild posterior lean in sitting Static Standing Balance Static Standing - Balance Support: Bilateral upper extremity supported (UEs supported on RW) Static Standing - Level of Assistance: 4: Min assist Static Standing - Comment/# of Minutes: stood for ~30 sec x2 trials    Special needs/care consideration  Continuous Drip IV: currently Oxygen: on currently-was not on at home Skin: no problems noted                              Bowel mgmt: LBM 06/23/13 Bladder mgmt: occ stress incontinence Diabetic mgmt: yes She is a TEFL teacher Witness    Previous Home Environment Living Arrangements: Alone Home Care Services: No Additional Comments: lived alone PTA, drives, has a cane, RW, bath chair with a back.     Discharge Living Setting Plans for Discharge Living Setting: Patient's home Type of Home at Discharge: House Discharge Home Layout: One level Discharge Home Access: Stairs to enter Entrance Stairs-Rails: None Entrance Stairs-Number of Steps: 1 STE- threshold Do you have any problems obtaining your medications?: No  Social/Family/Support Systems Patient Roles: Parent Contact Information: [phone] Anticipated Caregiver: daughters, Rolla Plate & Lajoyce Lauber Anticipated Caregiver's Contact Information: Dagoberto Reef [phone] & Debra's [phone] Ability/Limitations of Caregiver: Min A Caregiver Availability: 24/7 Discharge Plan Discussed with Primary Caregiver: Yes Is Caregiver In Agreement with Plan?: Yes Does Caregiver/Family have Issues with Lodging/Transportation while Pt is in Rehab?: No    Goals/Additional Needs Patient/Family Goal for Rehab: S-Mod I Expected length of stay: @ 2 weeks Pt/Family Agrees to Admission and willing to participate: Yes Program Orientation Provided & Reviewed with Pt/Caregiver Including Roles  & Responsibilities: Yes   Decrease burden of Care through IP rehab admission: Patient/family education   Possible need for SNF placement upon discharge: no   Patient Condition: This patient's condition remains as documented in the  consult dated 06/23/13 at 12:31 PM, in which the Rehabilitation Physician determined and documented that the patient's condition is appropriate for intensive rehabilitative care in an inpatient rehabilitation facility. Will admit to inpatient rehab today.  Preadmission Screen Completed By:  Brock Ra, 06/24/2013 4:31 PM ______________________________________________________________________   Discussed status with Dr. Riley Kill on 6/27/14at 0845 and received telephone approval for admission today.  Admission Coordinator:  Brock Ra, time6/25/14/Date 0845 / Lutricia Horsfall [phone] on 06/25/13

## 2013-06-25 ENCOUNTER — Inpatient Hospital Stay (HOSPITAL_COMMUNITY)
Admission: RE | Admit: 2013-06-25 | Discharge: 2013-07-09 | DRG: 945 | Disposition: A | Discharge status: Home / Self Care | Payer: Medicare Other | Source: Intra-hospital | Attending: Physical Medicine & Rehabilitation | Admitting: Physical Medicine & Rehabilitation

## 2013-06-25 DIAGNOSIS — D649 Anemia, unspecified: Secondary | ICD-10-CM

## 2013-06-25 DIAGNOSIS — D62 Acute posthemorrhagic anemia: Secondary | ICD-10-CM | POA: Diagnosis present

## 2013-06-25 DIAGNOSIS — F329 Major depressive disorder, single episode, unspecified: Secondary | ICD-10-CM | POA: Diagnosis present

## 2013-06-25 DIAGNOSIS — S72033A Displaced midcervical fracture of unspecified femur, initial encounter for closed fracture: Secondary | ICD-10-CM | POA: Diagnosis present

## 2013-06-25 DIAGNOSIS — Z5189 Encounter for other specified aftercare: Principal | ICD-10-CM

## 2013-06-25 DIAGNOSIS — W19XXXA Unspecified fall, initial encounter: Secondary | ICD-10-CM

## 2013-06-25 DIAGNOSIS — F411 Generalized anxiety disorder: Secondary | ICD-10-CM | POA: Diagnosis present

## 2013-06-25 DIAGNOSIS — S7291XS Unspecified fracture of right femur, sequela: Secondary | ICD-10-CM

## 2013-06-25 DIAGNOSIS — S72009A Fracture of unspecified part of neck of unspecified femur, initial encounter for closed fracture: Secondary | ICD-10-CM

## 2013-06-25 DIAGNOSIS — G4700 Insomnia, unspecified: Secondary | ICD-10-CM | POA: Diagnosis present

## 2013-06-25 DIAGNOSIS — J189 Pneumonia, unspecified organism: Secondary | ICD-10-CM | POA: Diagnosis present

## 2013-06-25 DIAGNOSIS — G20 Parkinson's disease: Secondary | ICD-10-CM | POA: Diagnosis present

## 2013-06-25 DIAGNOSIS — I82409 Acute embolism and thrombosis of unspecified deep veins of unspecified lower extremity: Secondary | ICD-10-CM | POA: Diagnosis present

## 2013-06-25 DIAGNOSIS — R404 Transient alteration of awareness: Secondary | ICD-10-CM | POA: Diagnosis present

## 2013-06-25 DIAGNOSIS — I10 Essential (primary) hypertension: Secondary | ICD-10-CM

## 2013-06-25 DIAGNOSIS — K219 Gastro-esophageal reflux disease without esophagitis: Secondary | ICD-10-CM | POA: Diagnosis present

## 2013-06-25 DIAGNOSIS — E119 Type 2 diabetes mellitus without complications: Secondary | ICD-10-CM | POA: Diagnosis present

## 2013-06-25 DIAGNOSIS — S7291XA Unspecified fracture of right femur, initial encounter for closed fracture: Secondary | ICD-10-CM

## 2013-06-25 DIAGNOSIS — J988 Other specified respiratory disorders: Secondary | ICD-10-CM | POA: Diagnosis present

## 2013-06-25 DIAGNOSIS — E785 Hyperlipidemia, unspecified: Secondary | ICD-10-CM | POA: Diagnosis present

## 2013-06-25 DIAGNOSIS — M1990 Unspecified osteoarthritis, unspecified site: Secondary | ICD-10-CM | POA: Diagnosis present

## 2013-06-25 DIAGNOSIS — Z79899 Other long term (current) drug therapy: Secondary | ICD-10-CM | POA: Diagnosis not present

## 2013-06-25 DIAGNOSIS — G20A1 Parkinson's disease without dyskinesia, without mention of fluctuations: Secondary | ICD-10-CM | POA: Diagnosis present

## 2013-06-25 DIAGNOSIS — F3289 Other specified depressive episodes: Secondary | ICD-10-CM | POA: Diagnosis present

## 2013-06-25 LAB — GLUCOSE, CAPILLARY

## 2013-06-25 MED ORDER — ONDANSETRON HCL 4 MG PO TABS: 4.0000 mg | ORAL_TABLET | Freq: Four times a day (QID) | ORAL | Status: DC | PRN

## 2013-06-25 MED ORDER — GABAPENTIN 300 MG PO CAPS
300.0000 mg | ORAL_CAPSULE | Freq: Three times a day (TID) | ORAL | Status: DC
  Administered 2013-06-25 – 2013-07-09 (×41): 300 mg via ORAL
  Filled 2013-06-25 (×46): qty 1

## 2013-06-25 MED ORDER — ONDANSETRON HCL 4 MG/2ML IJ SOLN: 4.0000 mg | Freq: Four times a day (QID) | INTRAMUSCULAR | Status: DC | PRN

## 2013-06-25 MED ORDER — SIMVASTATIN 20 MG PO TABS
20.0000 mg | ORAL_TABLET | Freq: Every day | ORAL | Status: DC
  Administered 2013-06-25 – 2013-07-08 (×14): 20 mg via ORAL
  Filled 2013-06-25 (×15): qty 1

## 2013-06-25 MED ORDER — PANTOPRAZOLE SODIUM 40 MG PO TBEC
40.0000 mg | DELAYED_RELEASE_TABLET | Freq: Every day | ORAL | Status: DC
  Administered 2013-06-26 – 2013-07-09 (×14): 40 mg via ORAL
  Filled 2013-06-25 (×17): qty 1

## 2013-06-25 MED ORDER — DULOXETINE HCL 60 MG PO CPEP
60.0000 mg | ORAL_CAPSULE | Freq: Every day | ORAL | Status: DC
  Administered 2013-06-26 – 2013-07-09 (×14): 60 mg via ORAL
  Filled 2013-06-25 (×17): qty 1

## 2013-06-25 MED ORDER — FELODIPINE ER 5 MG PO TB24
5.0000 mg | ORAL_TABLET | Freq: Every day | ORAL | Status: DC
  Administered 2013-06-26 – 2013-07-09 (×13): 5 mg via ORAL
  Filled 2013-06-25 (×17): qty 1

## 2013-06-25 MED ORDER — ENALAPRIL MALEATE 20 MG PO TABS
20.0000 mg | ORAL_TABLET | Freq: Every day | ORAL | Status: DC
  Administered 2013-06-26 – 2013-07-09 (×12): 20 mg via ORAL
  Filled 2013-06-25 (×17): qty 1

## 2013-06-25 MED ORDER — SENNOSIDES-DOCUSATE SODIUM 8.6-50 MG PO TABS
1.0000 | ORAL_TABLET | Freq: Every day | ORAL | Status: DC
  Administered 2013-06-25 – 2013-07-08 (×14): 1 via ORAL
  Filled 2013-06-25 (×20): qty 1

## 2013-06-25 MED ORDER — SORBITOL 70 % SOLN
30.0000 mL | Freq: Every day | Status: DC | PRN
  Administered 2013-06-26 – 2013-06-30 (×3): 30 mL via ORAL
  Filled 2013-06-25 (×3): qty 30

## 2013-06-25 MED ORDER — CLONAZEPAM 0.5 MG PO TABS
1.0000 mg | ORAL_TABLET | Freq: Every day | ORAL | Status: DC
  Administered 2013-06-25 – 2013-07-08 (×14): 1 mg via ORAL
  Filled 2013-06-25 (×14): qty 2

## 2013-06-25 MED ORDER — ACETAMINOPHEN 325 MG PO TABS
325.0000 mg | ORAL_TABLET | ORAL | Status: DC | PRN
  Administered 2013-06-25 – 2013-07-09 (×19): 650 mg via ORAL
  Filled 2013-06-25 (×19): qty 2

## 2013-06-25 MED ORDER — POLYSACCHARIDE IRON COMPLEX 150 MG PO CAPS
150.0000 mg | ORAL_CAPSULE | Freq: Every day | ORAL | Status: DC
  Administered 2013-06-26 – 2013-07-09 (×14): 150 mg via ORAL
  Filled 2013-06-25 (×17): qty 1

## 2013-06-25 MED ORDER — RIVAROXABAN 15 MG PO TABS
15.0000 mg | ORAL_TABLET | Freq: Two times a day (BID) | ORAL | Status: DC
  Administered 2013-06-25 – 2013-07-06 (×22): 15 mg via ORAL
  Filled 2013-06-25 (×27): qty 1

## 2013-06-25 MED ORDER — RIVAROXABAN 20 MG PO TABS: 20.0000 mg | ORAL_TABLET | Freq: Every day | ORAL | Status: DC

## 2013-06-25 MED ORDER — FELODIPINE ER 5 MG PO TB24
5.0000 mg | ORAL_TABLET | Freq: Every day | ORAL | Status: DC
  Filled 2013-06-25 (×2): qty 1

## 2013-06-25 MED ORDER — CLONIDINE HCL 0.1 MG PO TABS
0.1000 mg | ORAL_TABLET | Freq: Every day | ORAL | Status: DC
  Administered 2013-06-25 – 2013-07-08 (×14): 0.1 mg via ORAL
  Filled 2013-06-25 (×15): qty 1

## 2013-06-25 MED ORDER — OXYCODONE HCL 5 MG PO TABS
5.0000 mg | ORAL_TABLET | Freq: Four times a day (QID) | ORAL | Status: DC | PRN
  Administered 2013-06-25 – 2013-07-08 (×34): 5 mg via ORAL
  Filled 2013-06-25 (×34): qty 1

## 2013-06-25 NOTE — Discharge Summary (Signed)
Physician Discharge Summary  Patient ID: Kim Nichols MRN: [id number] DOB/AGE: 1929-05-19 79 y.o.  Admit date: 06/21/2013 Discharge date: 06/25/2013  Admission Diagnoses:  Discharge Diagnoses:  Active Problems: Acute encephalopathy /delirium History of major depression chronic insomnia History of postherpetic neurolgia Status post right hip surgery after the fall-done on 06/19/13- at [facility] hospital    HTN (hypertension)   Hyperlipidemia   Anemia Atelectasis/chronic changes   Acute respiratory failure  DVT of leg (deep venous thrombosis)-right lower leg   Discharged Condition: good  Hospital Course: *79 years old female status post fall, sustaining a right hip fracture repaired at [facility] the hospital postop acute mental status changes hypoxia, transferred to [facility] system,to further management Problem #1 altered mental status: CT scan negative; she was admitted to ICU for close observation, because of the hypoxia initially possibility of pneumonia she was started on broad-spectrum antibiotics pulmonary critical care management ICU repeat x-ray was showed chronic changes her white count remained normal, no fever. Antibiotic was discontinued. Encephalopathy and delirium improved thought to be multifactorial with medication effects postop surgery. Problem #2: anemia postop hemoglobin 8.0 patient is Jehov[employer]'s Witness no blood products; started on iron supplement repeat CBC if needed and symptomatic may consider iron transfusion Problem #3: hypertension patient blood pressure medication was resumed, continue monitoring blood pressures do the rehabilitation. Problem #4 right hip surgery surgical site looked clean without any drainage staples in place. Physical therapy started, partial weight bearing as per the orthopedics with a follow up in 2 weeks from the surgery. Pain control adequate with oxycodone. Problem #5 right leg DVT on ultrasound, postop DVT initially she was started on  Lovenox and Coumadin, switch to Xarelto 50 mg b.i.d. For 3 weeks then 20 mg daily for 3 months. Problem 6 major depression and chronic insomnia continue Cymbalta and Klonopin ( patient was taking Ativan at home which was discontinued; as well as patient was taking Ambien at home) Problem #7: postherpetic neurology at continue Neurontin Problem #8: patient had some tremors recent evaluation with neurologist she was started on Sinemet recently which was discontinued possible medication effects.   Consults: pulmonary/intensive care and rehabilitation medicine  Significant Diagnostic Studies: labs: Blood chemistries normal, blood counts normal with hemoglobin of 8.0 UA negative TSH normal and radiology: CXR: chronic changes and CT scan: negative  Treatments: antibiotics: Levaquin and anticoagulation: Xarelto  Discharge Exam: Blood pressure 109/44, pulse 69, temperature 98 F (36.7 C), temperature source Oral, resp. rate 19, height 5\' 4"  (1.626 m), weight 65.499 kg (144 lb 6.4 oz), SpO2 92.00%. General appearance: alert Resp: clear to auscultation bilaterally Cardio: regular rate and rhythm Extremities: right thigh surgical site clean with staples, no swelling  Disposition:  inpatient rehabilitation/ CIR  Discharge Orders   Future Orders Complete By Expires     Diet - low sodium heart healthy  As directed     Discharge instructions  As directed     Comments:      Right Hip fx- surgical site care Ortho f/u Dr Ardis Hughs- in [place] - as scheduled ( 2 week)    Increase activity slowly  As directed         Medication List    STOP taking these medications       carbidopa-levodopa 25-100 MG per tablet  Commonly known as:  SINEMET IR     HYDROcodone-acetaminophen 7.5-325 MG per tablet  Commonly known as:  NORCO     LORazepam 1 MG tablet  Commonly known as:  ATIVAN  zolpidem 10 MG tablet  Commonly known as:  AMBIEN      TAKE these medications       acetaminophen 325 MG  tablet  Commonly known as:  TYLENOL  Take 2 tablets (650 mg total) by mouth every 6 (six) hours as needed.     CALCIUM 600 + D PO  Take 600 mg by mouth once. Take 2 tabs once a day     clonazePAM 1 MG tablet  Commonly known as:  KLONOPIN  Take 1 tablet (1 mg total) by mouth at bedtime.     cloNIDine 0.1 MG tablet  Commonly known as:  CATAPRES  Take 0.1 mg by mouth at bedtime.     DULoxetine 60 MG capsule  Commonly known as:  CYMBALTA  Take 60 mg by mouth daily.     enalapril 20 MG tablet  Commonly known as:  VASOTEC  Take 20 mg by mouth daily.     esomeprazole 40 MG capsule  Commonly known as:  NEXIUM  Take 40 mg by mouth daily before breakfast.     felodipine 5 MG 24 hr tablet  Commonly known as:  PLENDIL  Take 5 mg by mouth daily.     gabapentin 300 MG capsule  Commonly known as:  NEURONTIN  Take 300 mg by mouth 3 (three) times daily.     iron polysaccharides 150 MG capsule  Commonly known as:  NIFEREX  Take 1 capsule (150 mg total) by mouth daily.     multivitamin with minerals Tabs  Take 1 tablet by mouth daily. Vision Formula with Lutein     oxyCODONE 5 MG immediate release tablet  Commonly known as:  Oxy IR/ROXICODONE  Take 1 tablet (5 mg total) by mouth every 6 (six) hours as needed.     Rivaroxaban 15 MG Tabs tablet  Commonly known as:  XARELTO  Take 1 tablet (15 mg total) by mouth 2 (two) times daily with a meal.     senna-docusate 8.6-50 MG per tablet  Commonly known as:  Senokot-S  Take 1 tablet by mouth at bedtime.     simvastatin 40 MG tablet  Commonly known as:  ZOCOR  Take 40 mg by mouth at bedtime.           Follow-up Information   Follow up with AZAR,GEORGE, MD On 07/05/2013. (@ 1:00 PM appt. scheduled per Raynelle Fanning, RN)    Contact information:   [phone] [place], [place]     Discharge planning total 45 minutes. SignedGeorgann Housekeeper 06/25/2013, 7:56 AM

## 2013-06-25 NOTE — Progress Notes (Signed)
Kim Nichols, [place] [phone] 06/25/2013

## 2013-06-25 NOTE — Progress Notes (Signed)
Overall Plan of Care [facility]) Patient Details Name: Kim Nichols MRN: [id number] DOB: 1929/01/28  Diagnosis:  Right hip fracture  Co-morbidities: Pneumonia, DVT  Functional Problem List  Patient demonstrates impairments in the following areas: Balance, Bladder, Bowel, Cognition, Endurance, Medication Management, Pain, Safety, Sensory , Skin Integrity and Vision  Basic ADL's: eating, grooming, bathing, dressing, toileting and transfers Advanced ADL's: simple meal preparation, laundry and light housekeeping  Transfers:  bed mobility, bed to chair, toilet, tub/shower, car and furniture Locomotion:  ambulation  Additional Impairments:  Social Cognition   problem solving, memory and awareness  Anticipated Outcomes Item Anticipated Outcome  Eating/Swallowing    Basic self-care  supervision  Tolieting  supervision  Bowel/Bladder  Remain continent of bowel and bladder  Transfers  Supervision/Mod-I  Locomotion  S/Mod-I x 150'  Communication    Cognition  Mod I-Supervision   Pain  3 or less on scale 0-10  Safety/Judgment  Mod I   Other     Therapy Plan: PT Intensity: Minimum of 1-2 x/day ,45 to 90 minutes PT Duration Estimated Length of Stay: 10-14 days OT Intensity: Minimum of 1-2 x/day, 45 to 90 minutes OT Frequency: 5 out of 7 days OT Duration/Estimated Length of Stay: 2 weeks SLP Intensity: Minumum of 1-2 x/day, 30 to 90 minutes SLP Frequency: 5 out of 7 days SLP Duration/Estimated Length of Stay: 2 weeks    Team Interventions: Item RN PT OT SLP SW TR Other  Self Care/Advanced ADL Retraining   X      Neuromuscular Re-Education         Therapeutic Activities  x X x     UE/LE Strength Training/ROM  x X      UE/LE Coordination Activities  x X      Visual/Perceptual Remediation/Compensation         DME/Adaptive Equipment Instruction  x X      Therapeutic Exercise  x X      Balance/Vestibular Training  x X      Patient/Family Education x x X x     Cognitive  Remediation/Compensation   X x     Functional Mobility Training  x       Ambulation/Gait Training  x       Office manager Facilitation         Bladder Management x        Bowel Management x        Disease Management/Prevention x        Pain Management x        Medication Management x        Skin Care/Wound Management x        Splinting/Orthotics         Discharge Planning x  X x     Psychosocial Support x  X x                            Team Discharge Planning: Destination: PT-Home ,OT-   , SLP-Home Projected Follow-up: PT-Home health PT, OT-   , SLP-24 hour supervision/assistance;Outpatient SLP Projected Equipment Needs: PT- , OT-  , SLP-None recommended by SLP Patient/family involved in  discharge planning: PT- Patient,  OT-Patient, SLP-Patient  MD ELOS: 10 days Medical Rehab Prognosis:  Good Assessment: 79 year old female who fell and fractured right hip. Postoperative complications of pneumonia and DVT. Confusion secondary to pain medication Now requiring 24/7 Rehab RN,MD, as well as CIR level PT, OT .  Treatment team will focus on ADLs and mobility with goals set at Mod I/ SUP   See Team Conference Notes for weekly updates to the plan of care

## 2013-06-25 NOTE — Interval H&P Note (Signed)
Kim Nichols was admitted today to Inpatient Rehabilitation with the diagnosis of right subcapital hip fracture s/p ORIF.  The patient's history has been reviewed, patient examined, and there is no change in status.  Patient continues to be appropriate for intensive inpatient rehabilitation.  I have reviewed the patient's chart and labs.  Questions were answered to the patient's satisfaction.  [name] T 06/25/2013, 2:23 PM

## 2013-06-25 NOTE — Progress Notes (Signed)
Admitting patient to CIR today. Dr Donette Larry reports pt is ready for d/c to CIR and pt is in agreement. Pt's daughter informed. Pt's RN is aware of plan. I can be reached at [phone]

## 2013-06-25 NOTE — H&P (View-Only) (Signed)
Physical Medicine and Rehabilitation Admission H&P    No chief complaint on file. : HPI: Kim Nichols is a 79 y.o. right-handed female with history of questionable Parkinson's disease versus movement disorder and recently placed on Sinemet. Kim Nichols was independent and active prior to admission as well as driving. Patient with recent right subcapital femur fracture at [place] [facility] and underwent ORIF 06/18/2013. Postoperatively she developed acute encephalopathy with fever and received antibiotics for suspect pneumonia/UTI. She was transferred to [facility] on 6/23 for ongoing medical care. Recent cranial CT scan at outside hospital reported to be negative. Lower extremity Dopplers consistent with deep vein thrombosis involving the right gastrocnemius vein. Patient maintained on broad-spectrum antibiotics for pneumonia/urinary tract infection. Echocardiogram with ejection fraction of 65% and normal systolic function. Chest x-ray completed showing diffuse peribronchial thickening with patchy bilateral airspace opacities question chronic lung disease versus acute infiltrates. Placed on Xarelto secondary to DVT. Noted delirium/confusion felt to be multifactorial in nature and narcotics were minimized. Her Sinemet was also discontinued secondary to possible cause of confusion. Acute blood loss anemia after hip fracture latest hemoglobin of 8.0 and monitored with iron supplement added ( patient is Jehovah witness). Physical and occupational therapy evaluations completed an ongoing. Patient is partial weightbearing status after right hip surgery. M.D. is requested physical medicine rehabilitation consult to consider inpatient rehabilitation services. Pt was ultimately admitted today.    Review of Systems  Gastrointestinal:  GERD  Genitourinary:  Occasional incontinence of urine  Musculoskeletal: Positive for myalgias and falls.  Psychiatric/Behavioral:  Anxiety /depression All other  systems reviewed and are negative    Past Medical History  Diagnosis Date  . Hypertension   . Diabetes mellitus without complication   . Dyslipidemia   . History of gastroesophageal reflux (GERD)   . Osteoarthritis   . Anxiety disorder   . Chronic insomnia   . Incontinence of urine    Past Surgical History  Procedure Laterality Date  . Knee surgery  4-11   History reviewed. No pertinent family history. Social History:  reports that she has never smoked. She has never used smokeless tobacco. She reports that she does not drink alcohol or use illicit drugs. Allergies:  Allergies  Allergen Reactions  . Aleve (Naproxen Sodium) Itching   Medications Prior to Admission  Medication Sig Dispense Refill  . carbidopa-levodopa (SINEMET IR) 25-100 MG per tablet Take 1 tablet by mouth 3 (three) times daily.  60 tablet  2  . cloNIDine (CATAPRES) 0.1 MG tablet Take 0.1 mg by mouth at bedtime.       . DULoxetine (CYMBALTA) 60 MG capsule Take 60 mg by mouth daily.       . enalapril (VASOTEC) 20 MG tablet Take 20 mg by mouth daily.       . esomeprazole (NEXIUM) 40 MG capsule Take 40 mg by mouth daily before breakfast.      . felodipine (PLENDIL) 5 MG 24 hr tablet Take 5 mg by mouth daily.       . gabapentin (NEURONTIN) 300 MG capsule Take 300 mg by mouth 3 (three) times daily.       . HYDROcodone-acetaminophen (NORCO) 7.5-325 MG per tablet Take 1 tablet by mouth daily as needed for pain.       . LORazepam (ATIVAN) 1 MG tablet Take 1 mg by mouth 2 (two) times daily.       . Multiple Vitamin (MULTIVITAMIN WITH MINERALS) TABS Take 1 tablet by mouth daily. Vision Formula with Lutein      .   simvastatin (ZOCOR) 40 MG tablet Take 40 mg by mouth at bedtime.       . Calcium Carbonate-Vitamin D (CALCIUM 600 + D PO) Take 600 mg by mouth once. Take 2 tabs once a day      . zolpidem (AMBIEN) 10 MG tablet Take 10 mg by mouth at bedtime.         Home: Home Living Family/patient expects to be discharged  to:: Private residence Living Arrangements: Alone   Functional History:    Functional Status:  Mobility:          ADL:    Cognition: Cognition Orientation Level: Oriented X4    Physical Exam: Blood pressure 161/61, pulse 73, temperature 97.7 F (36.5 C), temperature source Oral, resp. rate 19, height 5' 4" (1.626 m), weight 67.4 kg (148 lb 9.4 oz), SpO2 100.00%. Physical Exam  Vitals reviewed.  Constitutional: She appears fairly well developed. alert HENT: oral mucosa pink and moist. Dentition fair.  Head: Normocephalic.  Eyes: EOM are normal.  Neck: Neck supple. No thyromegaly present.  Cardiovascular: Normal rate and regular rhythm.  Pulmonary/Chest: Effort normal and breath sounds normal.  Abdominal: Soft. Bowel sounds are normal. She exhibits no distension.  Neurological: She is alert.  Patient was able to provide her name, place, date of birth and age. She followed simple commands. Displayed good insight and awareness. Skin:  Hip incision healing with staples intact without drainage. Area is appropriately tender. Bruising and edema noted around the injury/op site. oriented to hospital in [place] but not [place], day/night confusion  Motor strength is 5/5 bilateral deltoid, biceps, triceps, grip  Right lower extremity 2 to 3 minus hip flexor knee extensor ankle dorsiflexor and plantar flexor are 4  5/5 in the left hip flexor knee extensor ankle dorsiflexor and plantar flexor. No sensory changes noted.  Babinski's are downgoing  No evidence of cogwheel rigidity in the upper extremities. No tremor. Affect is flat. Visual fields appear intact   Results for orders placed during the hospital encounter of 06/21/13 (from the past 48 hour(s))  MRSA PCR SCREENING     Status: None   Collection Time    06/21/13  8:53 PM      Result Value Range   MRSA by PCR NEGATIVE  NEGATIVE   Comment:            The GeneXpert MRSA Assay (FDA     approved for NASAL specimens      only), is one component of a     comprehensive MRSA colonization     surveillance program. It is not     intended to diagnose MRSA     infection nor to guide or     monitor treatment for     MRSA infections.  BLOOD GAS, ARTERIAL     Status: Abnormal   Collection Time    06/21/13 10:32 PM      Result Value Range   FIO2 1.00     O2 Content 15.0     Delivery systems NON-REBREATHER OXYGEN MASK     pH, Arterial 7.383  7.350 - 7.450   pCO2 arterial 52.5 (*) 35.0 - 45.0 mmHg   pO2, Arterial 104.0 (*) 80.0 - 100.0 mmHg   Bicarbonate 30.6 (*) 20.0 - 24.0 mEq/L   TCO2 32.2  0 - 100 mmol/L   Acid-Base Excess 5.7 (*) 0.0 - 2.0 mmol/L   O2 Saturation 98.7     Patient temperature 98.6     Collection   site RIGHT RADIAL     Drawn by [id number]     Sample type ARTERIAL     Allens test (pass/fail) PASS  PASS  GLUCOSE, CAPILLARY     Status: Abnormal   Collection Time    06/21/13 10:39 PM      Result Value Range   Glucose-Capillary 116 (*) 70 - 99 mg/dL  URINALYSIS, ROUTINE W REFLEX MICROSCOPIC     Status: Abnormal   Collection Time    06/21/13 11:03 PM      Result Value Range   Color, Urine YELLOW  YELLOW   APPearance CLOUDY (*) CLEAR   Specific Gravity, Urine 1.028  1.005 - 1.030   pH 6.0  5.0 - 8.0   Glucose, UA NEGATIVE  NEGATIVE mg/dL   Hgb urine dipstick TRACE (*) NEGATIVE   Bilirubin Urine NEGATIVE  NEGATIVE   Ketones, ur 15 (*) NEGATIVE mg/dL   Protein, ur 30 (*) NEGATIVE mg/dL   Urobilinogen, UA 1.0  0.0 - 1.0 mg/dL   Nitrite NEGATIVE  NEGATIVE   Leukocytes, UA NEGATIVE  NEGATIVE  URINE CULTURE     Status: None   Collection Time    06/21/13 11:03 PM      Result Value Range   Specimen Description URINE, CATHETERIZED     Special Requests NONE     Culture  Setup Time 06/21/2013 23:20     Colony Count NO GROWTH     Culture NO GROWTH     Report Status 06/22/2013 FINAL    URINE RAPID DRUG SCREEN (HOSP PERFORMED)     Status: Abnormal   Collection Time    06/21/13 11:03 PM       Result Value Range   Opiates POSITIVE (*) NONE DETECTED   Cocaine NONE DETECTED  NONE DETECTED   Benzodiazepines NONE DETECTED  NONE DETECTED   Amphetamines NONE DETECTED  NONE DETECTED   Tetrahydrocannabinol NONE DETECTED  NONE DETECTED   Barbiturates NONE DETECTED  NONE DETECTED   Comment:            DRUG SCREEN FOR MEDICAL PURPOSES     ONLY.  IF CONFIRMATION IS NEEDED     FOR ANY PURPOSE, NOTIFY LAB     WITHIN 5 DAYS.                LOWEST DETECTABLE LIMITS     FOR URINE DRUG SCREEN     Drug Class       Cutoff (ng/mL)     Amphetamine      1000     Barbiturate      200     Benzodiazepine   200     Tricyclics       300     Opiates          300     Cocaine          300     THC              50  URINE MICROSCOPIC-ADD ON     Status: Abnormal   Collection Time    06/21/13 11:03 PM      Result Value Range   Squamous Epithelial / LPF FEW (*) RARE   WBC, UA 0-2  <3 WBC/hpf   RBC / HPF 0-2  <3 RBC/hpf   Bacteria, UA FEW (*) RARE   Crystals URIC ACID CRYSTALS (*) NEGATIVE  GLUCOSE, CAPILLARY     Status: Abnormal   Collection Time      06/22/13 12:19 AM      Result Value Range   Glucose-Capillary 112 (*) 70 - 99 mg/dL   Comment 1 Documented in Chart     Comment 2 Notify RN    COMPREHENSIVE METABOLIC PANEL     Status: Abnormal   Collection Time    06/22/13  2:15 AM      Result Value Range   Sodium 141  135 - 145 mEq/L   Potassium 3.5  3.5 - 5.1 mEq/L   Chloride 102  96 - 112 mEq/L   CO2 35 (*) 19 - 32 mEq/L   Glucose, Bld 134 (*) 70 - 99 mg/dL   BUN 12  6 - 23 mg/dL   Creatinine, Ser 0.63  0.50 - 1.10 mg/dL   Calcium 8.5  8.4 - 10.5 mg/dL   Total Protein 5.7 (*) 6.0 - 8.3 g/dL   Albumin 2.6 (*) 3.5 - 5.2 g/dL   AST 25  0 - 37 U/L   ALT 14  0 - 35 U/L   Alkaline Phosphatase 52  39 - 117 U/L   Total Bilirubin 0.4  0.3 - 1.2 mg/dL   GFR calc non Af Amer 81 (*) >90 mL/min   GFR calc Af Amer >90  >90 mL/min   Comment:            The eGFR has been calculated     using the  CKD EPI equation.     This calculation has not been     validated in all clinical     situations.     eGFR's persistently     <90 mL/min signify     possible Chronic Kidney Disease.  CBC WITH DIFFERENTIAL     Status: Abnormal   Collection Time    06/22/13  2:15 AM      Result Value Range   WBC 6.0  4.0 - 10.5 K/uL   RBC 2.87 (*) 3.87 - 5.11 MIL/uL   Hemoglobin 8.5 (*) 12.0 - 15.0 g/dL   HCT 26.1 (*) 36.0 - 46.0 %   MCV 90.9  78.0 - 100.0 fL   MCH 29.6  26.0 - 34.0 pg   MCHC 32.6  30.0 - 36.0 g/dL   RDW 12.6  11.5 - 15.5 %   Platelets 157  150 - 400 K/uL   Neutrophils Relative % 76  43 - 77 %   Neutro Abs 4.6  1.7 - 7.7 K/uL   Lymphocytes Relative 10 (*) 12 - 46 %   Lymphs Abs 0.6 (*) 0.7 - 4.0 K/uL   Monocytes Relative 8  3 - 12 %   Monocytes Absolute 0.5  0.1 - 1.0 K/uL   Eosinophils Relative 4  0 - 5 %   Eosinophils Absolute 0.3  0.0 - 0.7 K/uL   Basophils Relative 2 (*) 0 - 1 %   Basophils Absolute 0.1  0.0 - 0.1 K/uL  TSH     Status: None   Collection Time    06/22/13  2:15 AM      Result Value Range   TSH 2.049  0.350 - 4.500 uIU/mL  CULTURE, BLOOD (ROUTINE X 2)     Status: None   Collection Time    06/22/13  2:15 AM      Result Value Range   Specimen Description BLOOD LEFT ARM     Special Requests BOTTLES DRAWN AEROBIC ONLY 10CC     Culture  Setup Time 06/22/2013 09:45       Culture       Value:        BLOOD CULTURE RECEIVED NO GROWTH TO DATE CULTURE WILL BE HELD FOR 5 DAYS BEFORE ISSUING A FINAL NEGATIVE REPORT   Report Status PENDING    MAGNESIUM     Status: None   Collection Time    06/22/13  2:15 AM      Result Value Range   Magnesium 1.8  1.5 - 2.5 mg/dL  CULTURE, BLOOD (ROUTINE X 2)     Status: None   Collection Time    06/22/13  2:35 AM      Result Value Range   Specimen Description BLOOD RIGHT HAND     Special Requests BOTTLES DRAWN AEROBIC ONLY 10CC EACH     Culture  Setup Time 06/22/2013 09:45     Culture       Value:        BLOOD CULTURE  RECEIVED NO GROWTH TO DATE CULTURE WILL BE HELD FOR 5 DAYS BEFORE ISSUING A FINAL NEGATIVE REPORT   Report Status PENDING    PROCALCITONIN     Status: None   Collection Time    06/22/13  2:35 AM      Result Value Range   Procalcitonin 0.22     Comment:            Interpretation:     PCT (Procalcitonin) <= 0.5 ng/mL:     Systemic infection (sepsis) is not likely.     Local bacterial infection is possible.     (NOTE)             ICU PCT Algorithm               Non ICU PCT Algorithm        ----------------------------     ------------------------------             PCT < 0.25 ng/mL                 PCT < 0.1 ng/mL         Stopping of antibiotics            Stopping of antibiotics           strongly encouraged.               strongly encouraged.        ----------------------------     ------------------------------           PCT level decrease by               PCT < 0.25 ng/mL           >= 80% from peak PCT           OR PCT 0.25 - 0.5 ng/mL          Stopping of antibiotics                                                 encouraged.         Stopping of antibiotics               encouraged.        ----------------------------     ------------------------------           PCT level decrease by                PCT >= 0.25 ng/mL           < 80% from peak PCT            AND PCT >= 0.5 ng/mL            Continuing antibiotics                                                  encouraged.           Continuing antibiotics                encouraged.        ----------------------------     ------------------------------         PCT level increase compared          PCT > 0.5 ng/mL             with peak PCT AND              PCT >= 0.5 ng/mL             Escalation of antibiotics                                              strongly encouraged.          Escalation of antibiotics            strongly encouraged.  AMMONIA     Status: None   Collection Time    06/22/13  2:41 AM      Result Value Range    Ammonia 31  11 - 60 umol/L  BASIC METABOLIC PANEL     Status: Abnormal   Collection Time    06/23/13  3:04 AM      Result Value Range   Sodium 137  135 - 145 mEq/L   Potassium 3.0 (*) 3.5 - 5.1 mEq/L   Chloride 99  96 - 112 mEq/L   CO2 34 (*) 19 - 32 mEq/L   Glucose, Bld 135 (*) 70 - 99 mg/dL   BUN 12  6 - 23 mg/dL   Creatinine, Ser 0.65  0.50 - 1.10 mg/dL   Calcium 8.1 (*) 8.4 - 10.5 mg/dL   GFR calc non Af Amer 80 (*) >90 mL/min   GFR calc Af Amer >90  >90 mL/min   Comment:            The eGFR has been calculated     using the CKD EPI equation.     This calculation has not been     validated in all clinical     situations.     eGFR's persistently     <90 mL/min signify     possible Chronic Kidney Disease.  CBC     Status: Abnormal   Collection Time    06/23/13  3:04 AM      Result Value Range   WBC 5.3  4.0 - 10.5 K/uL   RBC 2.67 (*) 3.87 - 5.11 MIL/uL   Hemoglobin 8.0 (*) 12.0 - 15.0 g/dL   HCT 23.8 (*) 36.0 - 46.0 %   MCV 89.1  78.0 - 100.0 fL   MCH 30.0  26.0 - 34.0 pg   MCHC   33.6  30.0 - 36.0 g/dL   RDW 12.7  11.5 - 15.5 %   Platelets 162  150 - 400 K/uL  PRO B NATRIURETIC PEPTIDE     Status: Abnormal   Collection Time    06/23/13  3:04 AM      Result Value Range   Pro B Natriuretic peptide (BNP) 5146.0 (*) 0 - 450 pg/mL  PROCALCITONIN     Status: None   Collection Time    06/23/13  3:04 AM      Result Value Range   Procalcitonin 0.13     Comment:            Interpretation:     PCT (Procalcitonin) <= 0.5 ng/mL:     Systemic infection (sepsis) is not likely.     Local bacterial infection is possible.     (NOTE)             ICU PCT Algorithm               Non ICU PCT Algorithm        ----------------------------     ------------------------------             PCT < 0.25 ng/mL                 PCT < 0.1 ng/mL         Stopping of antibiotics            Stopping of antibiotics           strongly encouraged.               strongly encouraged.         ----------------------------     ------------------------------           PCT level decrease by               PCT < 0.25 ng/mL           >= 80% from peak PCT           OR PCT 0.25 - 0.5 ng/mL          Stopping of antibiotics                                                 encouraged.         Stopping of antibiotics               encouraged.        ----------------------------     ------------------------------           PCT level decrease by              PCT >= 0.25 ng/mL           < 80% from peak PCT            AND PCT >= 0.5 ng/mL            Continuing antibiotics                                                  encouraged.           Continuing antibiotics                  encouraged.        ----------------------------     ------------------------------         PCT level increase compared          PCT > 0.5 ng/mL             with peak PCT AND              PCT >= 0.5 ng/mL             Escalation of antibiotics                                              strongly encouraged.          Escalation of antibiotics            strongly encouraged.  PROTIME-INR     Status: None   Collection Time    06/23/13  9:50 AM      Result Value Range   Prothrombin Time 13.0  11.6 - 15.2 seconds   INR 1.00  0.00 - 1.49   Dg Chest Port 1 View  06/23/2013   *RADIOLOGY REPORT*  Clinical Data: Respiratory failure.  PORTABLE CHEST - 1 VIEW  Comparison: 06/21/2013  Findings: Diffuse peribronchial thickening and interstitial prominence, increased since prior study.  Heart is borderline in size.  No visible effusions or acute bony abnormality.  IMPRESSION: Worsening peribronchial thickening and interstitial prominence.   Original Report Authenticated By: Kevin Dover, M.D.   Dg Chest Port 1 View  06/21/2013   *RADIOLOGY REPORT*  Clinical Data: Check for infiltrates.  Low O2 sats.  PORTABLE CHEST - 1 VIEW  Comparison: None.  Findings: Mild peribronchial thickening.  Patchy bilateral airspace opacities.  It is  unclear this represents chronic lung disease or patchy acute infiltrates.  Left base atelectasis or scarring. Heart is borderline in size.  No significant effusions.  No acute bony abnormality.  IMPRESSION: Diffuse peribronchial thickening with patchy bilateral airspace opacities, question chronic lung disease versus acute infiltrates.   Original Report Authenticated By: Kevin Dover, M.D.    Post Admission Physician Evaluation: 1. Functional deficits secondary  to right subcapital hip fx s/p ORIF. 2. Patient is admitted to receive collaborative, interdisciplinary care between the physiatrist, rehab nursing staff, and therapy team. 3. Patient's level of medical complexity and substantial therapy needs in context of that medical necessity cannot be provided at a lesser intensity of care such as a SNF. 4. Patient has experienced substantial functional loss from his/her baseline which was documented above under the "Functional History" and "Functional Status" headings.  Judging by the patient's diagnosis, physical exam, and functional history, the patient has potential for functional progress which will result in measurable gains while on inpatient rehab.  These gains will be of substantial and practical use upon discharge  in facilitating mobility and self-care at the household level. 5. Physiatrist will provide 24 hour management of medical needs as well as oversight of the therapy plan/treatment and provide guidance as appropriate regarding the interaction of the two. 6. 24 hour rehab nursing will assist with bladder management, bowel management, safety, skin/wound care, disease management, medication administration, pain management and patient education  and help integrate therapy concepts, techniques,education, etc. 7. PT will assess and treat for/with: Lower extremity strength, range of motion, stamina, balance, functional mobility, safety, adaptive techniques and equipment, pain mgt, ortho   precautions,  education.   Goals are: supervision to minimal assist. 8. OT will assess and treat for/with: ADL's, functional mobility, safety, upper extremity strength, adaptive techniques and equipment, pain mgt, ortho precautions, education.   Goals are: supervision to min assist. 9. SLP will assess and treat for/with: n/a .  Goals are: n/a. 10. Case Management and Social Worker will assess and treat for psychological issues and discharge planning. 11. Team conference will be held weekly to assess progress toward goals and to determine barriers to discharge. 12. Patient will receive at least 3 hours of therapy per day at least 5 days per week. 13. ELOS: 2.5 to 3 weeks       14. Prognosis:  good   Medical Problem List and Plan: 1. Right subcapital femur fracture. Status post ORIF 06/18/2013 at [facility]. Partial weightbearing right lower extremity 2. DVT Prophylaxis/Anticoagulation: Continue Xarelto for deep vein thrombosis right gastrocnemius vein 3. Pain Management: Neurontin 200 mg 3 times daily, oxycodone immediate release 5 mg every 6 hours as needed. Monitor mental status 4. Mood/delirium/confusion. Limit narcotics as much as possible. Recent cranial CT scan negative. Mental status continues to improve and she appeared fairly functional today. Continue Klonopin 1 mg each bedtime and Cymbalta 60 mg daily. Provide emotional support 5. Neuropsych: This patient is capable of making decisions on her own behalf. 6. Acute blood loss anemia. Followup CBC. Patient is Jehovah witness---no blood products. Continue iron supplement 7. Hypertension. Clonidine patch 0.1 mg weekly, Vasotec 20 mg daily, Plendil 5 mg daily. Monitor with increased mobility 8. Parkinson's disease versus movement disorder. Sinemet discontinued secondary to possible contributor of confusion 9. Diet controlled diabetes mellitus. Check blood sugars a.c. and at bedtime 10. Pneumonia. All antibiotics since discontinued. Oxygen  saturations greater than 90% on room air. Latest chest x-ray chronic changes no visible effusions or acute abnormality           Zachary T. Swartz, MD, FAAPMR [facility]   06/23/2013 

## 2013-06-25 NOTE — Progress Notes (Signed)
Physical Therapy Treatment Patient Details Name: Kim Nichols MRN: [id number] DOB: Sep 21, 1929 Today's Date: 06/25/2013 Time: 1016-1040 PT Time Calculation (min): 24 min  PT Assessment / Plan / Recommendation  PT Comments   Pt continues to be limited by weakness and fatigue. Pt was able to state her WB status without any VC. She was able to ambulate a little bit further today but required 2L 02 via [place] when 02sats dropped to 85%. After supplemental O2, sats returned to 90%. 02 sats were 95% at end of Tx. Patient will benefit from CIR to increase overall strength, conditioning, and functional ability.  Follow Up Recommendations  CIR     Does the patient have the potential to tolerate intense rehabilitation     Barriers to Discharge        Equipment Recommendations  3in1 (PT)    Recommendations for Other Services Rehab consult  Frequency Min 5X/week   Progress towards PT Goals Progress towards PT goals: Progressing toward goals  Plan Current plan remains appropriate    Precautions / Restrictions Precautions Precautions: Fall Precaution Comments: h/o fall Restrictions Weight Bearing Restrictions: Yes RLE Weight Bearing: Partial weight bearing RLE Partial Weight Bearing Percentage or Pounds: 50   Pertinent Vitals/Pain Patient made no reports of pain today.     Mobility  Bed Mobility Bed Mobility: Not assessed Details for Bed Mobility Assistance: pt up in chair Transfers Transfers: Sit to Stand;Stand to Sit Sit to Stand: 4: Min assist;With upper extremity assist;With armrests;From chair/3-in-1 Stand to Sit: 4: Min assist;With upper extremity assist;With armrests;To chair/3-in-1 Details for Transfer Assistance: Pt is weak in her UE and attempts transfers using primarily her LLE. Pt. was given VC to use her UE to aid her with transfers and that she needed to use her RLE (within the precautions). When sitting pt wants to flop into chair. VC were given to encourage her to safely  lower herself using UE. Ambulation/Gait Ambulation/Gait Assistance: 3: Mod assist Ambulation Distance (Feet): 16 Feet Assistive device: Rolling walker Ambulation/Gait Assistance Details: Pt required repeated cueing to extend trunk inside of RW. Pt. saw herself in the mirror and said she didnt realize she was bent forward so much. She was followed with the recliner and required a rest break after ~8'. Gait Pattern: Step-to pattern;Decreased stride length;Shuffle Gait velocity: decreased Stairs: No Wheelchair Mobility Wheelchair Mobility: No    Exercises General Exercises - Lower Extremity Hip Flexion/Marching: AROM;Both;10 reps;Standing (in RW) Sit <--> stand x5       PT Goals (current goals can now be found in the care plan section)    Visit Information  Last PT Received On: 06/25/13 Assistance Needed: +2 (for safety and equipment) History of Present Illness:  79 yo with history of hypertension, suspected Parkinson's disease (recently started on Sinemet) and remote DVT admitted to [facility] on 6/20 with R hip fx. Underwent ORIF. Postoperatively she developed acute encephalopathy and fever. Received abx for suspected PNA / UTI.     Subjective Data      Cognition  Cognition Arousal/Alertness: Awake/alert Behavior During Therapy: WFL for tasks assessed/performed Overall Cognitive Status: Within Functional Limits for tasks assessed    Balance     End of Session PT - End of Session Equipment Utilized During Treatment: Gait belt Activity Tolerance: Patient limited by fatigue Patient left: in chair;with call bell/phone within reach Nurse Communication: Mobility status   GP     Jolyn Nap, SPTA 06/25/2013, 10:55 AM

## 2013-06-26 ENCOUNTER — Inpatient Hospital Stay (HOSPITAL_COMMUNITY): Payer: Medicare Other | Admitting: Speech Pathology

## 2013-06-26 ENCOUNTER — Inpatient Hospital Stay (HOSPITAL_COMMUNITY): Payer: Medicare Other | Admitting: Physical Therapy

## 2013-06-26 ENCOUNTER — Inpatient Hospital Stay (HOSPITAL_COMMUNITY): Payer: Medicare Other | Admitting: *Deleted

## 2013-06-26 DIAGNOSIS — G92 Toxic encephalopathy: Secondary | ICD-10-CM

## 2013-06-26 DIAGNOSIS — S72009A Fracture of unspecified part of neck of unspecified femur, initial encounter for closed fracture: Secondary | ICD-10-CM

## 2013-06-26 LAB — GLUCOSE, CAPILLARY: Glucose-Capillary: 170 mg/dL — ABNORMAL HIGH (ref 70–99)

## 2013-06-26 NOTE — Evaluation (Addendum)
Occupational Therapy Assessment and Plan  Patient Details  Name: Kim Nichols MRN: [id number] Date of Birth: [birth year]  OT Diagnosis: ataxia Rehab Potential:   good ELOS: 2 weeks 2 weeks  Today's Date: 06/26/2013 Time:1100-1210  Time Calculation (min): 70 min  Problem List:  Patient Active Problem List   Diagnosis Date Noted  . Acute encephalopathy 06/22/2013  . Delirium 06/22/2013  . DVT of leg (deep venous thrombosis) 06/22/2013  . HTN (hypertension) 06/21/2013  . Hyperlipidemia 06/21/2013  . Anemia 06/21/2013  . Pneumonia 06/21/2013  . Acute respiratory failure 06/21/2013    Past Medical History:  Past Medical History  Diagnosis Date  . Hypertension   . Diabetes mellitus without complication   . Dyslipidemia   . History of gastroesophageal reflux (GERD)   . Osteoarthritis   . Anxiety disorder   . Chronic insomnia   . Incontinence of urine    Past Surgical History:  Past Surgical History  Procedure Laterality Date  . Knee surgery  4-11    Assessment & Plan Clinical Impression: Kim Nichols is a 79 y.o. right-handed female with history of questionable Parkinson's disease versus movement disorder and recently placed on Sinemet. Kim Nichols was independent and active prior to admission as well as driving. Patient with recent right subcapital femur fracture at [facility] and underwent ORIF 06/18/2013. Postoperatively she developed acute encephalopathy with fever and received antibiotics for suspect pneumonia/UTI. She was transferred to [facility] on 6/23 for ongoing medical care. Recent cranial CT scan at outside hospital reported to be negative. Lower extremity Dopplers consistent with deep vein thrombosis involving the right gastrocnemius vein. Patient maintained on broad-spectrum antibiotics for pneumonia/urinary tract infection. Echocardiogram with ejection fraction of 65% and normal systolic function. Chest x-ray completed showing diffuse peribronchial  thickening with patchy bilateral airspace opacities question chronic lung disease versus acute infiltrates. Placed on Xarelto secondary to DVT. Noted delirium/confusion felt to be multifactorial in nature and narcotics were minimized. Her Sinemet was also discontinued secondary to possible cause of confusion. Acute blood loss anemia after hip fracture latest hemoglobin of 8.0 and monitored with iron supplement added ( patient is Jehovah witness). Physical and occupational therapy evaluations completed an ongoing. Patient is partial weightbearing status after right hip surgery. M.D. is requested physical medicine rehabilitation consult to consider inpatient rehabilitation services. Pt was ultimately admitted today.      Patient transferred to CIR on 06/25/2013 .    Patient currently requires mod with basic self-care skills secondary to muscle weakness and decreased cardiorespiratoy endurance.  Prior to hospitalization, patient could complete BADL with modified independent .  Patient will benefit from skilled intervention to increase independence with basic self-care skills and increase level of independence with iADL prior to discharge home with care partner.  Anticipate patient will require intermittent supervision and follow up home health.  OT - End of Session Endurance Deficit: Yes OT Plan OT Intensity: Minimum of 1-2 x/day, 45 to 90 minutes OT Frequency: 5 out of 7 days OT Duration/Estimated Length of Stay: 2 weeks OT Treatment/Interventions: Balance/vestibular training;Community reintegration;Discharge planning;DME/adaptive equipment instruction;Functional mobility training;Patient/family education;Self Care/advanced ADL retraining;Therapeutic Activities;Therapeutic Exercise;UE/LE Strength taining/ROM;UE/LE Coordination activities;Wheelchair propulsion/positioning   Skilled Therapeutic Intervention Addressed wc mobility, bed mobility, sit to stand, standing balance, WB'ing precautions,  transfers, UE strength and coordination.    OT Evaluation Precautions/Restrictions  Precautions Precautions: Fall Restrictions Weight Bearing Restrictions: Yes RLE Weight Bearing: Partial weight bearing Other Position/Activity Restrictions: 50%    Oxygen Therapy SpO2: 93 % (monitor every  4 hrs) O2 Device: None (Room air) O2 Flow Rate (L/min): 0 L/min Pain Pain Assessment Pain Assessment: 0-10 Pain Score: 0-No pain Faces Pain Scale: No hurt Pain Type: Surgical pain Pain Location: Hip Pain Orientation: Right Pain Descriptors / Indicators: Aching Pain Onset: Gradual Patients Stated Pain Goal: 3 Pain Intervention(s): Medication (See eMAR);Repositioned Multiple Pain Sites: No Home Living/Prior Functioning Home Living Available Help at Discharge: Family Type of Home: House (daughter will come up from [facility]  and has other daughter in [place]) Home Access: Stairs to enter Secretary/administrator of Steps: 1 Entrance Stairs-Rails: None Home Layout: One level;Full bath on main level Additional Comments: cane and RW and shower chair with back  Lives With: Alone IADL History Homemaking Responsibilities: Yes Meal Prep Responsibility: Primary Laundry Responsibility: Primary Cleaning Responsibility: Secondary Bill Paying/Finance Responsibility: Primary Shopping Responsibility: Primary Child Care Responsibility: No Current License: Yes Mode of Transportation: Car Occupation: Retired Leisure and Hobbies: Bible study Prior Function Level of Independence: Independent with basic ADLs;Independent with homemaking with ambulation;Independent with gait;Independent with transfers  Able to Take Stairs?: Yes Driving: Yes Vocation: Retired 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 typically used cane for outside ambulation and furniture surfed inside home. ADL ADL Eating: Independent Grooming: Minimal assistance Where Assessed-Grooming: Sitting at sink Upper Body Bathing: Minimal  assistance Where Assessed-Upper Body Bathing: Wheelchair;Sitting at sink Lower Body Bathing: Moderate assistance Where Assessed-Lower Body Bathing: Standing at sink;Wheelchair Upper Body Dressing: Setup Where Assessed-Upper Body Dressing: Wheelchair Lower Body Dressing: Moderate assistance Where Assessed-Lower Body Dressing: Sitting at sink;Standing at sink;Wheelchair Toileting: Not assessed Toilet Transfer: Not assessed Toilet Transfer Method: Not assessed Tub/Shower Transfer: Not assessed Tub/Shower Transfer Method: Unable to assess Vision/Perception  Vision - History Baseline Vision: Wears glasses only for reading Visual History:  (cataracts were removed B) Patient Visual Report: No change from baseline  Cognition Overall Cognitive Status: Within Functional Limits for tasks assessed Arousal/Alertness: Awake/alert Orientation Level: Oriented X4 Sensation Sensation Light Touch: Appears Intact Coordination Gross Motor Movements are Fluid and Coordinated: Yes Fine Motor Movements are Fluid and Coordinated: No Heel Shin Test:  (Not tested with R heel on shin due to R hip ORIF) Motor  Motor Motor: Within Functional Limits Mobility  Bed Mobility Bed Mobility: Rolling Left;Left Sidelying to Sit;Sitting - Scoot to [place] of Bed;Sit to Sidelying Left;Scooting to [facility] Rolling Left: 3: Mod assist;With rail Rolling Left Details: Verbal cues for technique;Manual facilitation for weight shifting Left Sidelying to Sit: 3: Mod assist;With rails;HOB flat Left Sidelying to Sit Details (indicate cue type and reason): Assisted B LE's OOB and lower to the floor and to bring trunk to upright sitting. Sitting - Scoot to Edge of Bed: 4: Min assist;With rail Sit to Sidelying Left: 3: Mod assist Sit to Sidelying Left Details (indicate cue type and reason): B LE's assisted into bed and into sidelying with pillow between knees Scooting to HOB: 2: Max assist;With rail Transfers Sit to Stand: 4: Min  assist;From elevated surface;With upper extremity assist Stand to Sit: 4: Min assist;With upper extremity assist;With armrests  Trunk/Postural Assessment  Cervical Assessment Cervical Assessment: Within Functional Limits Thoracic Assessment Thoracic Assessment: Exceptions to [facility] (kyphotic) Lumbar Assessment Lumbar Assessment: Within Functional Limits  Balance Balance Balance Assessed: Yes Dynamic Sitting Balance Dynamic Sitting - Balance Support: Feet supported;No upper extremity supported Dynamic Sitting - Level of Assistance: 5: Stand by assistance Dynamic Sitting - Balance Activities: Lateral lean/weight shifting;Forward lean/weight shifting Static Standing Balance Static Standing - Balance Support: Right upper extremity supported  Static Standing - Level of Assistance: 4: Min assist Dynamic Standing Balance Dynamic Standing - Balance Support: Bilateral upper extremity supported Dynamic Standing - Level of Assistance: 4: Min assist (using RW weight shifting to maintain 50% PWB on R LE ) Dynamic Standing - Balance Activities: Lateral lean/weight shifting;Forward lean/weight shifting Extremity/Trunk Assessment      FIM:  FIM - Eating Eating Activity: 7: Complete independence:no helper;1: Helper feeds patient;5: Needs verbal cues/supervision FIM - Grooming Grooming Steps: Wash, rinse, dry face;Wash, rinse, dry hands;Brush, comb hair Grooming: 4: Patient completes 3 of 4 or 4 of 5 steps FIM - Bathing Bathing Steps Patient Completed: Chest;Right Arm;Left Arm;Abdomen;Front perineal area;Buttocks;Right upper leg Bathing: 3: Mod-Patient completes 5-7 80f 10 parts or 50-74% FIM - Upper Body Dressing/Undressing Upper body dressing/undressing steps patient completed: Thread/unthread right bra strap;Thread/unthread left bra strap;Hook/unhook bra;Thread/unthread right sleeve of pullover shirt/dresss;Thread/unthread left sleeve of pullover shirt/dress;Put head through opening of pull over  shirt/dress;Pull shirt over trunk Upper body dressing/undressing: 4: Steadying assist FIM - Lower Body Dressing/Undressing Lower body dressing/undressing steps patient completed: Thread/unthread left underwear leg;Thread/unthread left pants leg Lower body dressing/undressing: 2: Max-Patient completed 25-49% of tasks FIM - Toileting Toileting: 0: Activity did not occur FIM - Banker Devices: Bed rails Bed/Chair Transfer: 4: Supine > Sit: Min A (steadying Pt. > 75%/lift 1 leg);3: Bed > Chair or W/C: Mod A (lift or lower assist) FIM - Archivist Transfers: 0-Activity did not occur FIM - IT consultant Transfers: 0-Activity did not occur or was simulated   Refer to Care Plan for Long Term Goals  Recommendations for other services: None  Discharge Criteria: Patient will be discharged from OT if patient refuses treatment 3 consecutive times without medical reason, if treatment goals not met, if there is a change in medical status, if patient makes no progress towards goals or if patient is discharged from hospital.  The above assessment, treatment plan, treatment alternatives and goals were discussed and mutually agreed upon: by patient  Humberto Seals 06/26/2013, 5:06 PM

## 2013-06-26 NOTE — Progress Notes (Signed)
Physical Therapy Session Note  Patient Details  Name: Leo Weyandt MRN: [id number] Date of Birth: Jun 30, 1929  Today's Date: 06/26/2013 Time: 1400-1430 Time Calculation (min): 30 min  Therapy Documentation Precautions:  Precautions Precautions: Fall Precaution Comments: h/o fall Restrictions Weight Bearing Restrictions: Yes RLE Weight Bearing: Partial weight bearing RLE Partial Weight Bearing Percentage or Pounds: 50 Other Position/Activity Restrictions: 50% Pain: reports pain at Right lateral hip ranging from 3 to 6/10 with nursing notified and issuing medications.  Therapeutic Activity:(15') Transfer training on/off toilet with S/min-Assist, transfers into chair with S/min-A Gait Training:(15') using RW and maintaining 50% PWB R LE 4 x 20' inside room with negotiating turns.   Therapy/Group: Individual Therapy  Rex Kras 06/26/2013, 6:45 PM

## 2013-06-26 NOTE — Plan of Care (Signed)
Problem: RH BOWEL ELIMINATION Goal: RH STG MANAGE BOWEL W/MEDICATION W/ASSISTANCE STG Manage Bowel with Medication with min Assistance.  Outcome: Not Progressing LBM 06-23-13 Goal: RH STG MANAGE BOWEL W/EQUIPMENT W/ASSISTANCE STG Manage Bowel With Equipment With min Assistance  Outcome: Not Progressing LBM 06-23-13

## 2013-06-26 NOTE — Evaluation (Signed)
Speech Language Pathology Assessment and Plan  Patient Details  Name: Kim Nichols MRN: [id number] Date of Birth: December 03, 1929  SLP Diagnosis: Cognitive Impairments  Rehab Potential: Excellent ELOS: 2 weeks   Today's Date: 06/26/2013 Time: 0900-1000 Time Calculation (min): 60 min  Skilled Therapeutic Intervention: Pt administered cognitive-linguistic evaluation and BSE. Please see below for details. Pt educated on current cognitive impairments and goal of SLP intervention. Pt verbalized understanding.   Problem List:  Patient Active Problem List   Diagnosis Date Noted  . Acute encephalopathy 06/22/2013  . Delirium 06/22/2013  . DVT of leg (deep venous thrombosis) 06/22/2013  . HTN (hypertension) 06/21/2013  . Hyperlipidemia 06/21/2013  . Anemia 06/21/2013  . Pneumonia 06/21/2013  . Acute respiratory failure 06/21/2013   Past Medical History:  Past Medical History  Diagnosis Date  . Hypertension   . Diabetes mellitus without complication   . Dyslipidemia   . History of gastroesophageal reflux (GERD)   . Osteoarthritis   . Anxiety disorder   . Chronic insomnia   . Incontinence of urine    Past Surgical History:  Past Surgical History  Procedure Laterality Date  . Knee surgery  4-11    Assessment / Plan / Recommendation Clinical Impression  Pt is an 79 y.o. right-handed female with history of questionable Parkinson's disease versus movement disorder and recently placed on Sinemet. Kim Nichols was independent and active prior to admission as well as driving. Patient with recent right subcapital femur fracture at [facility] and underwent ORIF 06/18/2013. Postoperatively she developed acute encephalopathy with fever and received antibiotics for suspect pneumonia/UTI. She was transferred to [facility] on 6/23 for ongoing medical care. Recent cranial CT scan at outside hospital reported to be negative. Lower extremity Dopplers consistent with deep vein thrombosis  involving the right gastrocnemius vein. Patient maintained on broad-spectrum antibiotics for pneumonia/urinary tract infection. Echocardiogram with ejection fraction of 65% and normal systolic function. Chest x-ray completed showing diffuse peribronchial thickening with patchy bilateral airspace opacities question chronic lung disease versus acute infiltrates. Placed on Xarelto secondary to DVT. Noted delirium/confusion felt to be multifactorial in nature and narcotics were minimized. Patient is partial weightbearing status after right hip surgery. Pt admitted to CIR 06/25/13 and presents with mild cognitive impairments characterized by decreased awareness, safety awareness, working memory and problem solving impacting pt's ability to complete functional ADL's safely. Pt also administered a BSE and recommended to continue current diet of regular textures with thin liquids. Pt would benefit from skilled SLP intervention to maximize cognitive recovery and overall functional independence prior to discharge home. Anticipate pt will require 24 hour supervision and f/u SLP services.     SLP Assessment  Patient will need skilled Speech Lanaguage Pathology Services during CIR admission    Recommendations  Diet Recommendations: Regular;Thin liquid Liquid Administration via: Cup;Straw Medication Administration: Whole meds with liquid Supervision: Patient able to self feed Compensations: Slow rate;Small sips/bites Postural Changes and/or Swallow Maneuvers: Seated upright 90 degrees Oral Care Recommendations: Oral care BID Patient destination: Home Follow up Recommendations: 24 hour supervision/assistance;Outpatient SLP Equipment Recommended: None recommended by SLP    SLP Frequency 5 out of 7 days   SLP Treatment/Interventions Cognitive remediation/compensation;Cueing hierarchy;Functional tasks;Internal/external aids;Environmental controls;Therapeutic Activities;Patient/family education    Pain Pain  Assessment Pain Assessment: No/denies pain Prior Functioning Cognitive/Linguistic Baseline: Within functional limits  Lives With: Alone Available Help at Discharge: Family  Short Term Goals: Week 1: SLP Short Term Goal 1 (Week 1): Pt will demonstrate functional problem solving for  mildly complex tasks with supervision verbal cues.  SLP Short Term Goal 2 (Week 1): Pt will utilize external memory aids to recall new, daily information with supervision question cues.  SLP Short Term Goal 3 (Week 1): Pt will identify 2 cognitive impairments with supervision question and semantic cues.  SLP Short Term Goal 4 (Week 1): Pt will utilize call bell to express wants/needs with supervision question cues.   See FIM for current functional status Refer to Care Plan for Long Term Goals  Recommendations for other services: None  Discharge Criteria: Patient will be discharged from SLP if patient refuses treatment 3 consecutive times without medical reason, if treatment goals not met, if there is a change in medical status, if patient makes no progress towards goals or if patient is discharged from hospital.  The above assessment, treatment plan, treatment alternatives and goals were discussed and mutually agreed upon: by patient  Kim Nichols 06/26/2013, 12:27 PM

## 2013-06-26 NOTE — Progress Notes (Signed)
Patient ID: Kim Nichols, female   DOB: Nov 23, 1929, 79 y.o.   MRN: [id number] Subjective/Complaints:   Objective: Vital Signs: Blood pressure 126/70, pulse 61, temperature 97.9 F (36.6 C), temperature source Oral, resp. rate 19, height 5\' 4"  (1.626 m), weight 63.5 kg (139 lb 15.9 oz), SpO2 96.00%. No results found. Results for orders placed during the hospital encounter of 06/25/13 (from the past 72 hour(s))  GLUCOSE, CAPILLARY     Status: Abnormal   Collection Time    06/25/13  4:28 PM      Result Value Range   Glucose-Capillary 167 (*) 70 - 99 mg/dL  GLUCOSE, CAPILLARY     Status: Abnormal   Collection Time    06/25/13  8:53 PM      Result Value Range   Glucose-Capillary 155 (*) 70 - 99 mg/dL   Comment 1 Notify RN    GLUCOSE, CAPILLARY     Status: Abnormal   Collection Time    06/26/13  7:05 AM      Result Value Range   Glucose-Capillary 124 (*) 70 - 99 mg/dL   Comment 1 Notify RN       HEENT: Poor dentition Cardio: RRR and No murmurs Resp: CTA B/L and Crackles at both bases GI: BS positive and Non-distended Extremity:  Pulses positive and No Edema Skin:   Wound C/D/I and Right hip with staples lateral aspect, no drainage, no ecchymosis Neuro: Alert/Oriented, Cranial Nerve II-XII normal, Normal Sensory, Abnormal Motor 5/5 bilateral deltoid, bicep, tricep, grip, 2 minus right hip flexor 3 minus right knee extensor for bilateral ankle dorsiflexor plantar flexor and left proximal and Tone:  Within Normal Limits Musc/Skel:  Swelling Right thigh and right calf General no acute distress   Assessment/Plan: 1. Functional deficits secondary to right subcapital hip fracture with post operative pneumonia and DVT which require 3+ hours per day of interdisciplinary therapy in a comprehensive inpatient rehab setting. Physiatrist is providing close team supervision and 24 hour management of active medical problems listed below. Physiatrist and rehab team continue to assess barriers to  discharge/monitor patient progress toward functional and medical goals. FIM:                   Comprehension Comprehension Mode: Auditory Comprehension: 5-Understands basic 90% of the time/requires cueing < 10% of the time  Expression Expression Mode: Verbal Expression: 5-Expresses basic 90% of the time/requires cueing < 10% of the time.  Social Interaction Social Interaction: 5-Interacts appropriately 90% of the time - Needs monitoring or encouragement for participation or interaction.  Problem Solving Problem Solving: 4-Solves basic 75 - 89% of the time/requires cueing 10 - 24% of the time  Memory Memory: 4-Recognizes or recalls 75 - 89% of the time/requires cueing 10 - 24% of the time  Medical Problem List and Plan:  1. Right subcapital femur fracture. Status post ORIF 06/18/2013 at [facility]. Partial weightbearing right lower extremity  2. DVT Prophylaxis/Anticoagulation: Continue Xarelto for deep vein thrombosis right gastrocnemius vein  3. Pain Management: Neurontin 200 mg 3 times daily, oxycodone immediate release 5 mg every 6 hours as needed. Monitor mental status  4. Mood/delirium/confusion. Limit narcotics as much as possible. Recent cranial CT scan negative. Mental status continues to improve and she appeared fairly functional today. Continue Klonopin 1 mg each bedtime and Cymbalta 60 mg daily. Provide emotional support  5. Neuropsych: This patient is capable of making decisions on her own behalf.  6. Acute blood loss anemia. Followup CBC. Patient  is Jehovah witness---no blood products. Continue iron supplement  7. Hypertension. Clonidine patch 0.1 mg weekly, Vasotec 20 mg daily, Plendil 5 mg daily. Monitor with increased mobility  8. Parkinson's disease versus movement disorder. Sinemet discontinued secondary to possible contributor of confusion  9. Diet controlled diabetes mellitus. Check blood sugars a.c. and at bedtime  10. Pneumonia. All  antibiotics since discontinued. Oxygen saturations greater than 90% on room air. Latest chest x-ray chronic changes no visible effusions or acute abnormality     LOS (Days) 1 A FACE TO FACE EVALUATION WAS PERFORMED  KIRSTEINS,ANDREW E 06/26/2013, 9:57 AM

## 2013-06-26 NOTE — Evaluation (Signed)
Physical Therapy Assessment and Plan  Patient Details  Name: Kim Nichols MRN: [id number] Date of Birth: 1929/05/26  PT Diagnosis: Difficulty walking and Muscle weakness Rehab Potential: Good ELOS: 10-14 days   Today's Date: 06/26/2013 Time: 1300-1400 Time Calculation (min): 60 min  Problem List:  Patient Active Problem List   Diagnosis Date Noted  . Acute encephalopathy 06/22/2013  . Delirium 06/22/2013  . DVT of leg (deep venous thrombosis) 06/22/2013  . HTN (hypertension) 06/21/2013  . Hyperlipidemia 06/21/2013  . Anemia 06/21/2013  . Pneumonia 06/21/2013  . Acute respiratory failure 06/21/2013    Past Medical History:  Past Medical History  Diagnosis Date  . Hypertension   . Diabetes mellitus without complication   . Dyslipidemia   . History of gastroesophageal reflux (GERD)   . Osteoarthritis   . Anxiety disorder   . Chronic insomnia   . Incontinence of urine    Past Surgical History:  Past Surgical History  Procedure Laterality Date  . Knee surgery  4-11    Assessment & Plan Clinical Impression: Kim Nichols is a 79 y.o. right-handed female with history of questionable Parkinson's disease versus movement disorder and recently placed on Sinemet. Kim Nichols was independent and active prior to admission as well as driving. Patient with recent right subcapital femur fracture at [facility] and underwent ORIF 06/18/2013. Postoperatively she developed acute encephalopathy with fever and received antibiotics for suspect pneumonia/UTI. She was transferred to [facility] on 6/23 for ongoing medical care. Recent cranial CT scan at outside hospital reported to be negative. Lower extremity Dopplers consistent with deep vein thrombosis involving the right gastrocnemius vein. Patient maintained on broad-spectrum antibiotics for pneumonia/urinary tract infection. Echocardiogram with ejection fraction of 65% and normal systolic function. Chest x-ray completed showing  diffuse peribronchial thickening with patchy bilateral airspace opacities question chronic lung disease versus acute infiltrates. Placed on Xarelto secondary to DVT. Noted delirium/confusion felt to be multifactorial in nature and narcotics were minimized. Her Sinemet was also discontinued secondary to possible cause of confusion. Acute blood loss anemia after hip fracture latest hemoglobin of 8.0 and monitored with iron supplement added ( patient is Jehovah witness).    Patient transferred to [facility] on 06/25/2013 .   Patient currently requires mod with mobility secondary to muscle weakness.  Prior to hospitalization, patient was modified independent  with mobility and lived with Alone in a House (daughter will come up from [facility]  and has other daughter in [place]) home.  Home access is 1Stairs to enter.  Patient will benefit from skilled PT intervention to maximize safe functional mobility, minimize fall risk and decrease caregiver burden for planned discharge home with 24 hour supervision.  Anticipate patient will benefit from follow up HH at discharge.  PT - End of Session Activity Tolerance: Tolerates 10 - 20 min activity with multiple rests Endurance Deficit: Yes PT Assessment Rehab Potential: Good PT Plan PT Intensity: Minimum of 1-2 x/day ,45 to 90 minutes PT Duration Estimated Length of Stay: 10-14 days PT Treatment/Interventions: Ambulation/gait training;Balance/vestibular training;Functional mobility training;Pain management;Patient/family education;Therapeutic Activities;Therapeutic Exercise;UE/LE Strength taining/ROM;UE/LE Coordination activities PT Recommendation Follow Up Recommendations: Home health PT Patient destination: Home  Skilled Therapeutic Intervention   PT Evaluation Precautions/Restrictions Precautions Precautions: Fall Precaution Comments: h/o fall Restrictions Weight Bearing Restrictions: Yes RLE Weight Bearing: Partial weight bearing RLE Partial Weight Bearing  Percentage or Pounds: 50 Other Position/Activity Restrictions: 50% General Chart Reviewed: Yes Family/Caregiver Present: No Vital Signs  Pain Pain Assessment Pain Assessment: 0-10 Pain Score:  6 Pain Type: Surgical pain Pain Location: Hip Pain Orientation: Right Pain Descriptors / Indicators: Aching Pain Onset: Gradual Patients Stated Pain Goal: 3 Pain Intervention(s): Medication (See eMAR);Repositioned Multiple Pain Sites: No Home Living/Prior Functioning Home Living Available Help at Discharge: Family Type of Home: House (daughter will come up from [facility]  and has other daughter in [place]) Home Access: Stairs to enter Secretary/administrator of Steps: 1 Entrance Stairs-Rails: None Home Layout: One level;Full bath on main level Additional Comments: cane and RW and shower chair with back  Lives With: Alone Prior Function Level of Independence: Independent with basic ADLs;Independent with homemaking with ambulation;Independent with gait;Independent with transfers  Able to Take Stairs?: Yes Driving: Yes Vocation: Retired 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 typically used cane for outside ambulation and furniture surfed inside home. Vision/Perception  Vision - History Baseline Vision: Wears glasses only for reading Visual History:  (cataracts were removed B) Patient Visual Report: No change from baseline Vision - Assessment Vision Assessment: Vision not tested Perception Perception: Within Functional Limits Praxis Praxis: Intact  Cognition Overall Cognitive Status: Within Functional Limits for tasks assessed Arousal/Alertness: Awake/alert Orientation Level: Oriented X4 Attention: Selective Selective Attention: Appears intact Memory: Impaired Memory Impairment: Decreased recall of new information;Decreased short term memory Decreased Short Term Memory: Verbal basic;Functional basic Awareness: Impaired Awareness Impairment: Intellectual impairment Problem  Solving: Impaired Problem Solving Impairment: Verbal basic;Functional basic Safety/Judgment: Impaired Sensation Sensation Light Touch: Appears Intact Coordination Gross Motor Movements are Fluid and Coordinated: Yes Fine Motor Movements are Fluid and Coordinated: No Heel Shin Test:  (Not tested with R heel on shin due to R hip ORIF) Motor  Motor Motor: Within Functional Limits Motor - Skilled Clinical Observations: difficulty maintaining weight bearing on RUE  Mobility Bed Mobility Bed Mobility: Rolling Left;Left Sidelying to Sit;Sitting - Scoot to [place] of Bed;Sit to Sidelying Left;Scooting to [facility] Rolling Left: 3: Mod assist;With rail Rolling Left Details: Verbal cues for technique;Manual facilitation for weight shifting Left Sidelying to Sit: 3: Mod assist;With rails;HOB flat Left Sidelying to Sit Details (indicate cue type and reason): Assisted B LE's OOB and lower to the floor and to bring trunk to upright sitting. Supine to Sit: 4: Min assist Supine to Sit Details: Verbal cues for precautions/safety;Verbal cues for technique Sitting - Scoot to Edge of Bed: 4: Min assist;With rail Sit to Sidelying Left: 3: Mod assist Sit to Sidelying Left Details (indicate cue type and reason): B LE's assisted into bed and into sidelying with pillow between knees Scooting to HOB: 2: Max assist;With rail Transfers Sit to Stand: 4: Min assist;From elevated surface;With upper extremity assist Sit to Stand Details: Verbal cues for precautions/safety Stand to Sit: 4: Min assist;With upper extremity assist;With armrests Stand Pivot Transfers: 3: Mod assist Stand Pivot Transfer Details: Tactile cues for placement;Manual facilitation for placement;Verbal cues for technique Locomotion  Ambulation Ambulation: Yes Ambulation/Gait Assistance: 4: Min assist Ambulation Distance (Feet): 20 Feet Assistive device: Rolling walker Gait Gait: Yes Gait Pattern: Impaired Gait Pattern: Step-to pattern;Decreased  step length - left;Decreased stride length;Antalgic;Trunk flexed Gait velocity: slow Stairs / Additional Locomotion Stairs: No Wheelchair Mobility Wheelchair Mobility: No  Trunk/Postural Assessment  Cervical Assessment Cervical Assessment: Within Lobbyist Thoracic Assessment: Exceptions to [facility] (kyphotic) Lumbar Assessment Lumbar Assessment: Within Functional Limits Postural Control Postural Control: Deficits on evaluation Postural Limitations: Pt. leans to left to avoid weight bearing on RUE  Balance Balance Balance Assessed: Yes Static Sitting Balance Static Sitting - Balance Support: Bilateral upper  extremity supported;Feet supported Static Sitting - Level of Assistance: 5: Stand by assistance Dynamic Sitting Balance Dynamic Sitting - Balance Support: Feet supported;No upper extremity supported Dynamic Sitting - Level of Assistance: 5: Stand by assistance Dynamic Sitting - Balance Activities: Lateral lean/weight shifting;Forward lean/weight shifting Static Standing Balance Static Standing - Balance Support: Right upper extremity supported Static Standing - Level of Assistance: 4: Min assist Dynamic Standing Balance Dynamic Standing - Balance Support: Bilateral upper extremity supported Dynamic Standing - Level of Assistance: 4: Min assist (using RW weight shifting to maintain 50% PWB on R LE ) Dynamic Standing - Balance Activities: Lateral lean/weight shifting;Forward lean/weight shifting Extremity Assessment  RUE Assessment RUE Assessment: Exceptions to [facility] RUE Strength RUE Overall Strength Comments: 4/5 LUE Assessment LUE Assessment: Exceptions to [facility] LUE Strength LUE Overall Strength Comments: 4+/5 RLE Assessment RLE Assessment: Exceptions to [facility] (R Hip 2/5, knee 3/5, ankle and EHL 5/5) LLE Assessment LLE Assessment: Within Functional Limits  FIM:  FIM - Banker Devices: Bed rails Bed/Chair  Transfer: 4: Supine > Sit: Min A (steadying Pt. > 75%/lift 1 leg);3: Bed > Chair or W/C: Mod A (lift or lower assist) FIM - Locomotion: Wheelchair Locomotion: Wheelchair: 1: Travels less than 50 ft with minimal assistance (Pt.>75%) FIM - Locomotion: Ambulation Ambulation/Gait Assistance: 4: Min assist   Refer to Care Plan for Long Term Goals  Recommendations for other services: None  Discharge Criteria: Patient will be discharged from PT if patient refuses treatment 3 consecutive times without medical reason, if treatment goals not met, if there is a change in medical status, if patient makes no progress towards goals or if patient is discharged from hospital.  The above assessment, treatment plan, treatment alternatives and goals were discussed and mutually agreed upon: by patient  Rex Kras 06/26/2013, 2:51 PM

## 2013-06-26 NOTE — Plan of Care (Signed)
Problem: RH BOWEL ELIMINATION Goal: RH STG MANAGE BOWEL W/MEDICATION W/ASSISTANCE STG Manage Bowel with Medication with min Assistance.  Outcome: Not Progressing Last BM 6/25.  Medication given.

## 2013-06-27 ENCOUNTER — Inpatient Hospital Stay (HOSPITAL_COMMUNITY): Payer: Medicare Other

## 2013-06-27 ENCOUNTER — Inpatient Hospital Stay (HOSPITAL_COMMUNITY): Payer: Medicare Other | Admitting: *Deleted

## 2013-06-27 DIAGNOSIS — S72009A Fracture of unspecified part of neck of unspecified femur, initial encounter for closed fracture: Secondary | ICD-10-CM

## 2013-06-27 DIAGNOSIS — I10 Essential (primary) hypertension: Secondary | ICD-10-CM

## 2013-06-27 DIAGNOSIS — G92 Toxic encephalopathy: Secondary | ICD-10-CM

## 2013-06-27 DIAGNOSIS — D62 Acute posthemorrhagic anemia: Secondary | ICD-10-CM

## 2013-06-27 LAB — GLUCOSE, CAPILLARY
Glucose-Capillary: 135 mg/dL — ABNORMAL HIGH (ref 70–99)
Glucose-Capillary: 272 mg/dL — ABNORMAL HIGH (ref 70–99)
Glucose-Capillary: 149 mg/dL — ABNORMAL HIGH (ref 70–99)

## 2013-06-27 NOTE — Progress Notes (Signed)
Physical Therapy Session Note  Patient Details  Name: Kim Nichols MRN: [id number] Date of Birth: 1929-08-05  Today's Date: 06/27/2013 Time: 4098-1191 Time Calculation (min): 45 min   Skilled Therapeutic Interventions/Progress Updates:  Patient in bed in supine at the beginning of the session, assistance in supine to sit ,min A to get LE of the bed, sitting EOB with good balance, mod A with lower body dressing, patient is able to stand up with min A and pull up her pants while maintaining weight bearing precautions. Multiple transfers all with min A and verbal cues for posture and forward weight shift.Gait training with RW  With min A 4 x 25 feet. Stairs  2x 4 steps with min A,patient is able to recall safe sequencing and maintain PWB. SAQ 2 x10 ,LAQ 2 x 10 ,both with 4 lbs on ankles.    Therapy Documentation Precautions:  Precautions Precautions: Fall Precaution Comments: h/o fall Restrictions Weight Bearing Restrictions: Yes RLE Weight Bearing: Partial weight bearing RLE Partial Weight Bearing Percentage or Pounds: 50 Other Position/Activity Restrictions: 50% Vital Signs: Therapy Vitals Temp: 97.4 F (36.3 C) Temp src: Oral Pulse Rate: 63 Resp: 19 BP: 172/60 mmHg Patient Position, if appropriate: Lying Oxygen Therapy SpO2: 99 % O2 Device: None (Room air)  See FIM for current functional status  Therapy/Group: Individual Therapy  Dorna Mai 06/27/2013, 9:17 AM

## 2013-06-27 NOTE — Progress Notes (Signed)
Patient ID: Kim Nichols, female   DOB: 11-14-1929, 79 y.o.   MRN: [id number] Subjective/Complaints: No complaints other than pain after physical therapy. Was walking up and down steps.  Objective: Vital Signs: Blood pressure 172/60, pulse 63, temperature 97.4 F (36.3 C), temperature source Oral, resp. rate 19, height 5\' 4"  (1.626 m), weight 63.5 kg (139 lb 15.9 oz), SpO2 99.00%. No results found. Results for orders placed during the hospital encounter of 06/25/13 (from the past 72 hour(s))  GLUCOSE, CAPILLARY     Status: Abnormal   Collection Time    06/25/13  4:28 PM      Result Value Range   Glucose-Capillary 167 (*) 70 - 99 mg/dL  GLUCOSE, CAPILLARY     Status: Abnormal   Collection Time    06/25/13  8:53 PM      Result Value Range   Glucose-Capillary 155 (*) 70 - 99 mg/dL   Comment 1 Notify RN    GLUCOSE, CAPILLARY     Status: Abnormal   Collection Time    06/26/13  7:05 AM      Result Value Range   Glucose-Capillary 124 (*) 70 - 99 mg/dL   Comment 1 Notify RN    GLUCOSE, CAPILLARY     Status: Abnormal   Collection Time    06/26/13 11:25 AM      Result Value Range   Glucose-Capillary 155 (*) 70 - 99 mg/dL   Comment 1 Notify RN    GLUCOSE, CAPILLARY     Status: Abnormal   Collection Time    06/26/13  4:24 PM      Result Value Range   Glucose-Capillary 131 (*) 70 - 99 mg/dL   Comment 1 Notify RN    GLUCOSE, CAPILLARY     Status: Abnormal   Collection Time    06/26/13  7:47 PM      Result Value Range   Glucose-Capillary 170 (*) 70 - 99 mg/dL  GLUCOSE, CAPILLARY     Status: Abnormal   Collection Time    06/27/13  6:06 AM      Result Value Range   Glucose-Capillary 135 (*) 70 - 99 mg/dL     HEENT: Poor dentition Cardio: RRR and No murmurs Resp: CTA B/L and Crackles at both bases GI: BS positive and Non-distended Extremity:  Pulses positive and No Edema Skin:   Wound C/D/I and Right hip with staples lateral aspect, no drainage, no ecchymosis Neuro:  Alert/Oriented, Cranial Nerve II-XII normal, Normal Sensory, Abnormal Motor 5/5 bilateral deltoid, bicep, tricep, grip, 2 minus right hip flexor 3 minus right knee extensor for bilateral ankle dorsiflexor plantar flexor and left proximal and Tone:  Within Normal Limits Musc/Skel:  Swelling Right thigh and right calf General no acute distress Hip pain with passive range of motion  Assessment/Plan: 1. Functional deficits secondary to right subcapital hip fracture with post operative pneumonia and DVT which require 3+ hours per day of interdisciplinary therapy in a comprehensive inpatient rehab setting. Physiatrist is providing close team supervision and 24 hour management of active medical problems listed below. Physiatrist and rehab team continue to assess barriers to discharge/monitor patient progress toward functional and medical goals. FIM: FIM - Bathing Bathing Steps Patient Completed: Chest;Right Arm;Left Arm;Abdomen;Front perineal area;Buttocks;Right upper leg Bathing: 3: Mod-Patient completes 5-7 13f 10 parts or 50-74%  FIM - Upper Body Dressing/Undressing Upper body dressing/undressing steps patient completed: Thread/unthread right bra strap;Thread/unthread left bra strap;Hook/unhook bra;Thread/unthread right sleeve of pullover shirt/dresss;Thread/unthread left sleeve of pullover  shirt/dress;Put head through opening of pull over shirt/dress;Pull shirt over trunk Upper body dressing/undressing: 4: Steadying assist FIM - Lower Body Dressing/Undressing Lower body dressing/undressing steps patient completed: Thread/unthread left underwear leg;Thread/unthread left pants leg Lower body dressing/undressing: 2: Max-Patient completed 25-49% of tasks  FIM - Toileting Toileting: 0: Activity did not occur  FIM - Archivist Transfers: 0-Activity did not occur  FIM - Banker Devices: Therapist, occupational: 4: Supine > Sit: Min A (steadying  Pt. > 75%/lift 1 leg);4: Chair or W/C > Bed: Min A (steadying Pt. > 75%)  FIM - Locomotion: Wheelchair Locomotion: Wheelchair: 0: Activity did not occur FIM - Locomotion: Ambulation Locomotion: Ambulation Assistive Devices: Designer, industrial/product Ambulation/Gait Assistance: 4: Min assist Locomotion: Ambulation: 1: Travels less than 50 ft with minimal assistance (Pt.>75%)  Comprehension Comprehension Mode: Auditory Comprehension: 5-Understands basic 90% of the time/requires cueing < 10% of the time  Expression Expression Mode: Verbal Expression: 5-Expresses basic 90% of the time/requires cueing < 10% of the time.  Social Interaction Social Interaction: 6-Interacts appropriately with others with medication or extra time (anti-anxiety, antidepressant).  Problem Solving Problem Solving: 4-Solves basic 75 - 89% of the time/requires cueing 10 - 24% of the time  Memory Memory: 4-Recognizes or recalls 75 - 89% of the time/requires cueing 10 - 24% of the time  Medical Problem List and Plan:  1. Right subcapital femur fracture. Status post ORIF 06/18/2013 at [facility]. Partial weightbearing right lower extremity, will check Xrays because of increased pain 2. DVT Prophylaxis/Anticoagulation: Continue Xarelto for deep vein thrombosis right gastrocnemius vein  3. Pain Management: Neurontin 200 mg 3 times daily, oxycodone immediate release 5 mg every 6 hours as needed. Monitor mental status  4. Mood/delirium/confusion. Limit narcotics as much as possible. Recent cranial CT scan negative. Mental status continues to improve and she appeared fairly functional today. Continue Klonopin 1 mg each bedtime and Cymbalta 60 mg daily. Provide emotional support  5. Neuropsych: This patient is capable of making decisions on her own behalf.  6. Acute blood loss anemia. Followup CBC. Patient is Jehovah witness---no blood products. Continue iron supplement  7. Hypertension. Clonidine patch 0.1 mg  weekly, Vasotec 20 mg daily, Plendil 5 mg daily. Monitor with increased mobility  8. Parkinson's disease versus movement disorder. Sinemet discontinued secondary to possible contributor of confusion  9. Diet controlled diabetes mellitus. Check blood sugars a.c. and at bedtime  10. Pneumonia. All antibiotics since discontinued. Oxygen saturations greater than 90% on room air. Latest chest x-ray chronic changes no visible effusions or acute abnormality     LOS (Days) 2 A FACE TO FACE EVALUATION WAS PERFORMED  Loralye Loberg E 06/27/2013, 10:22 AM

## 2013-06-28 ENCOUNTER — Inpatient Hospital Stay (HOSPITAL_COMMUNITY): Payer: Medicare Other

## 2013-06-28 ENCOUNTER — Inpatient Hospital Stay (HOSPITAL_COMMUNITY): Payer: Medicare Other | Admitting: Speech Pathology

## 2013-06-28 ENCOUNTER — Inpatient Hospital Stay (HOSPITAL_COMMUNITY): Payer: Medicare Other | Admitting: *Deleted

## 2013-06-28 DIAGNOSIS — S7291XA Unspecified fracture of right femur, initial encounter for closed fracture: Secondary | ICD-10-CM

## 2013-06-28 LAB — COMPREHENSIVE METABOLIC PANEL
ALT: 19 U/L (ref 0–35)
Calcium: 8.8 mg/dL (ref 8.4–10.5)
GFR calc Af Amer: 73 mL/min — ABNORMAL LOW (ref >90)
Glucose, Bld: 140 mg/dL — ABNORMAL HIGH (ref 70–99)
Sodium: 137 mEq/L (ref 135–145)
Total Protein: 6 g/dL (ref 6.0–8.3)

## 2013-06-28 LAB — CBC WITH DIFFERENTIAL/PLATELET
Eosinophils Absolute: 0 10*3/uL (ref 0.0–0.7)
Eosinophils Relative: 0 % (ref 0–5)
Lymphs Abs: 1.5 10*3/uL (ref 0.7–4.0)
MCH: 30.2 pg (ref 26.0–34.0)
MCHC: 32.9 g/dL (ref 30.0–36.0)
MCV: 91.7 fL (ref 78.0–100.0)
Platelets: 406 10*3/uL — ABNORMAL HIGH (ref 150–400)
RBC: 2.78 MIL/uL — ABNORMAL LOW (ref 3.87–5.11)
RDW: 14.3 % (ref 11.5–15.5)

## 2013-06-28 LAB — CULTURE, BLOOD (ROUTINE X 2): Culture: NO GROWTH

## 2013-06-28 LAB — GLUCOSE, CAPILLARY
Glucose-Capillary: 183 mg/dL — ABNORMAL HIGH (ref 70–99)
Glucose-Capillary: 167 mg/dL — ABNORMAL HIGH (ref 70–99)

## 2013-06-28 MED ORDER — GLUCERNA SHAKE PO LIQD
120.0000 mL | Freq: Four times a day (QID) | ORAL | Status: DC
  Administered 2013-06-28 – 2013-07-09 (×32): 120 mL via ORAL

## 2013-06-28 NOTE — Progress Notes (Signed)
Physical Therapy Session Note  Patient Details  Name: Kim Nichols MRN: [id number] Date of Birth: 12-01-1929  Today's Date: 06/28/2013 Time: 1030-1128 Time Calculation (min): 58 min  Short Term Goals: Week 1:  PT Short Term Goal 1 (Week 1): STGs=LTGs  Skilled Therapeutic Interventions/Progress Updates:    Patient received sitting in wheelchair. Session focused on transfers, gait training, and B LE strengthening. See details below for gait training. Patient able to repeat PWB precautions when asked at beginning of session without cues.  Started session with ensuring patient can maintain WB precautions of 50% on R LE. Per chart, patient weighs 139 lb, educated patient that 69 lbs is max amount of weight she can put through R LE. Used scale during 2 sit<>stand transfers and during pre-gait forward/retro stepping with L LE and use of RW (R LE on scale). Patient demonstrates ability to maintain PWB precautions of 50% on R LE during all functional mobility. Patient fatigues quickly and requires frequent, prolonged rest breaks. During seated rest breaks, patient performs seated LAQs and knee flexion x10 each LE.  Patient returned to room and left seated in wheelchair with all needs within reach and nurse tech present.  Therapy Documentation Precautions:  Precautions Precautions: Fall Precaution Comments: h/o fall Restrictions Weight Bearing Restrictions: Yes RLE Weight Bearing: Partial weight bearing RLE Partial Weight Bearing Percentage or Pounds: 50 Other Position/Activity Restrictions: 50% Pain: Pain Assessment Pain Assessment: No/denies pain Pain Score: 0-No pain Locomotion : Ambulation Ambulation: Yes Ambulation/Gait Assistance: 4: Min assist Ambulation Distance (Feet): 22 Feet Assistive device: Rolling walker Ambulation/Gait Assistance Details: Verbal cues for technique;Verbal cues for precautions/safety;Tactile cues for posture Ambulation/Gait Assistance Details: Patient  instructed in gait trianing 22' x2, 32' x1, and 29' x1 in controlled environment. Requires verbal cues for proper technique to bear weight through B UEs to maintain WB precautions. Gait Gait: Yes Gait Pattern: Impaired Gait Pattern: Step-to pattern;Decreased step length - left;Decreased stride length;Antalgic;Trunk flexed Corporate treasurer: Yes Wheelchair Assistance: 4: Administrator, sports Details: Verbal cues for safe use of DME/AE;Verbal cues for technique;Verbal cues for Engineer, drilling: Both upper extremities Wheelchair Parts Management: Needs assistance Distance: 55   See FIM for current functional status  Therapy/Group: Individual Therapy  Chipper Herb. Andri Prestia, PT, DPT 06/28/2013, 11:29 AM

## 2013-06-28 NOTE — Progress Notes (Signed)
Speech Language Pathology Daily Session Note  Patient Details  Name: Kim Nichols MRN: [id number] Date of Birth: 16-Apr-1929  Today's Date: 06/28/2013 Time: 2130-8657 Time Calculation (min): 15 min  Short Term Goals: Week 1: SLP Short Term Goal 1 (Week 1): Pt will demonstrate functional problem solving for mildly complex tasks with supervision verbal cues.  SLP Short Term Goal 1 - Progress (Week 1): Progressing toward goal SLP Short Term Goal 2 (Week 1): Pt will utilize external memory aids to recall new, daily information with supervision question cues.  SLP Short Term Goal 2 - Progress (Week 1): Progressing toward goal SLP Short Term Goal 3 (Week 1): Pt will identify 2 cognitive impairments with supervision question and semantic cues.  SLP Short Term Goal 3 - Progress (Week 1): Progressing toward goal SLP Short Term Goal 4 (Week 1): Pt will utilize call bell to express wants/needs with supervision question cues.  SLP Short Term Goal 4 - Progress (Week 1): Met  Skilled Therapeutic Interventions: Treatment today focused on cognition.  Portion of session missed due to pt meeting bladder/bowel needs.  Pt demonstrating improved initiation to make needs known, using call bell independently to seek help from nursing.  Describes loss of memory for events surrounding hospitalization, but she noted improvements the last 24 hours. Required  mod verbal cues to provide specific info about deficits since admission.  Pt described necessity of having better available lighting and exercising more caution "I have to be more careful when I'm walking." Limited session - reviewed focus on short-term recall for tomorrow's session.    FIM:  Comprehension Comprehension Mode: Auditory Comprehension: 5-Understands basic 90% of the time/requires cueing < 10% of the time Expression Expression Mode: Verbal Expression: 5-Expresses basic 90% of the time/requires cueing < 10% of the time. Social Interaction Social  Interaction: 6-Interacts appropriately with others with medication or extra time (anti-anxiety, antidepressant). Problem Solving Problem Solving: 4-Solves basic 75 - 89% of the time/requires cueing 10 - 24% of the time Memory Memory: 4-Recognizes or recalls 75 - 89% of the time/requires cueing 10 - 24% of the time  Pain Pain Assessment Pain Assessment: No/denies pain Pain Score: 0-No pain  Therapy/Group: Individual Therapy  Blenda Mounts Laurice 06/28/2013, 1:32 PM

## 2013-06-28 NOTE — Progress Notes (Signed)
Patient ID: Kim Nichols, female   DOB: 27-Mar-1929, 79 y.o.   MRN: [id number] Subjective/Complaints: No complaints other than pain after physical therapy. Was walking up and down steps.Repeat R hip Xray showed no hardware issues No problems with sleep , particpated in PT yesterday without c/o of pain  Objective: Vital Signs: Blood pressure 134/75, pulse 77, temperature 97.7 F (36.5 C), temperature source Oral, resp. rate 19, height 5\' 4"  (1.626 m), weight 63.5 kg (139 lb 15.9 oz), SpO2 98.00%. Dg Hip Complete Right  06/27/2013   *RADIOLOGY REPORT*  Clinical Data: Postop pain  RIGHT HIP - COMPLETE 2+ VIEW  Comparison: None.  Findings: Three screws are seen transfixing a right femoral neck fracture.  No breakage or loosening of the hardware.  Osteopenia. No definite acute fracture.  Degenerative changes in the lumbar spine are present.  IMPRESSION: Status post ORIF right femoral neck fracture.  No acute bony pathology.   Original Report Authenticated By: Jolaine Click, M.D.   Results for orders placed during the hospital encounter of 06/25/13 (from the past 72 hour(s))  GLUCOSE, CAPILLARY     Status: Abnormal   Collection Time    06/25/13  4:28 PM      Result Value Range   Glucose-Capillary 167 (*) 70 - 99 mg/dL  GLUCOSE, CAPILLARY     Status: Abnormal   Collection Time    06/25/13  8:53 PM      Result Value Range   Glucose-Capillary 155 (*) 70 - 99 mg/dL   Comment 1 Notify RN    GLUCOSE, CAPILLARY     Status: Abnormal   Collection Time    06/26/13  7:05 AM      Result Value Range   Glucose-Capillary 124 (*) 70 - 99 mg/dL   Comment 1 Notify RN    GLUCOSE, CAPILLARY     Status: Abnormal   Collection Time    06/26/13 11:25 AM      Result Value Range   Glucose-Capillary 155 (*) 70 - 99 mg/dL   Comment 1 Notify RN    GLUCOSE, CAPILLARY     Status: Abnormal   Collection Time    06/26/13  4:24 PM      Result Value Range   Glucose-Capillary 131 (*) 70 - 99 mg/dL   Comment 1 Notify RN     GLUCOSE, CAPILLARY     Status: Abnormal   Collection Time    06/26/13  7:47 PM      Result Value Range   Glucose-Capillary 170 (*) 70 - 99 mg/dL  GLUCOSE, CAPILLARY     Status: Abnormal   Collection Time    06/27/13  6:06 AM      Result Value Range   Glucose-Capillary 135 (*) 70 - 99 mg/dL  GLUCOSE, CAPILLARY     Status: Abnormal   Collection Time    06/27/13 11:27 AM      Result Value Range   Glucose-Capillary 272 (*) 70 - 99 mg/dL   Comment 1 Notify RN    GLUCOSE, CAPILLARY     Status: Abnormal   Collection Time    06/27/13  4:26 PM      Result Value Range   Glucose-Capillary 226 (*) 70 - 99 mg/dL   Comment 1 Notify RN    GLUCOSE, CAPILLARY     Status: Abnormal   Collection Time    06/27/13  8:37 PM      Result Value Range   Glucose-Capillary 149 (*) 70 - 99  mg/dL  CBC WITH DIFFERENTIAL     Status: Abnormal   Collection Time    06/28/13  5:50 AM      Result Value Range   WBC 9.5  4.0 - 10.5 K/uL   RBC 2.78 (*) 3.87 - 5.11 MIL/uL   Hemoglobin 8.4 (*) 12.0 - 15.0 g/dL   HCT 29.5 (*) 28.4 - 13.2 %   MCV 91.7  78.0 - 100.0 fL   MCH 30.2  26.0 - 34.0 pg   MCHC 32.9  30.0 - 36.0 g/dL   RDW 44.0  10.2 - 72.5 %   Platelets 406 (*) 150 - 400 K/uL   Neutrophils Relative % 71  43 - 77 %   Neutro Abs 6.7  1.7 - 7.7 K/uL   Lymphocytes Relative 16  12 - 46 %   Lymphs Abs 1.5  0.7 - 4.0 K/uL   Monocytes Relative 13 (*) 3 - 12 %   Monocytes Absolute 1.2 (*) 0.1 - 1.0 K/uL   Eosinophils Relative 0  0 - 5 %   Eosinophils Absolute 0.0  0.0 - 0.7 K/uL   Basophils Relative 0  0 - 1 %   Basophils Absolute 0.0  0.0 - 0.1 K/uL  COMPREHENSIVE METABOLIC PANEL     Status: Abnormal   Collection Time    06/28/13  5:50 AM      Result Value Range   Sodium 137  135 - 145 mEq/L   Potassium 4.4  3.5 - 5.1 mEq/L   Chloride 99  96 - 112 mEq/L   CO2 31  19 - 32 mEq/L   Glucose, Bld 140 (*) 70 - 99 mg/dL   BUN 13  6 - 23 mg/dL   Creatinine, Ser 3.66  0.50 - 1.10 mg/dL   Calcium 8.8  8.4 -  44.0 mg/dL   Total Protein 6.0  6.0 - 8.3 g/dL   Albumin 2.6 (*) 3.5 - 5.2 g/dL   AST 21  0 - 37 U/L   ALT 19  0 - 35 U/L   Alkaline Phosphatase 59  39 - 117 U/L   Total Bilirubin 0.4  0.3 - 1.2 mg/dL   GFR calc non Af Amer 63 (*) >90 mL/min   GFR calc Af Amer 73 (*) >90 mL/min   Comment:            The eGFR has been calculated     using the CKD EPI equation.     This calculation has not been     validated in all clinical     situations.     eGFR's persistently     <90 mL/min signify     possible Chronic Kidney Disease.  GLUCOSE, CAPILLARY     Status: Abnormal   Collection Time    06/28/13  7:24 AM      Result Value Range   Glucose-Capillary 140 (*) 70 - 99 mg/dL     HEENT: Poor dentition Cardio: RRR and No murmurs Resp: CTA B/L and Crackles at both bases GI: BS positive and Non-distended Extremity:  Pulses positive and No Edema Skin:   Wound C/D/I and Right hip with staples lateral aspect, no drainage, no ecchymosis Neuro: Alert/Oriented, Cranial Nerve II-XII normal, Normal Sensory, Abnormal Motor 5/5 bilateral deltoid, bicep, tricep, grip, 2 minus right hip flexor 3 minus right knee extensor for bilateral ankle dorsiflexor plantar flexor and left proximal and Tone:  Within Normal Limits Musc/Skel:  Swelling Right thigh and right calf General  no acute distress Hip pain with reduced passive range of motion  Assessment/Plan: 1. Functional deficits secondary to right subcapital hip fracture with post operative pneumonia and DVT which require 3+ hours per day of interdisciplinary therapy in a comprehensive inpatient rehab setting. Physiatrist is providing close team supervision and 24 hour management of active medical problems listed below. Physiatrist and rehab team continue to assess barriers to discharge/monitor patient progress toward functional and medical goals. FIM: FIM - Bathing Bathing Steps Patient Completed: Chest;Right Arm;Left Arm;Abdomen;Front perineal  area;Buttocks;Right upper leg Bathing: 3: Mod-Patient completes 5-7 50f 10 parts or 50-74%  FIM - Upper Body Dressing/Undressing Upper body dressing/undressing steps patient completed: Thread/unthread right bra strap;Thread/unthread left bra strap;Hook/unhook bra;Thread/unthread right sleeve of pullover shirt/dresss;Thread/unthread left sleeve of pullover shirt/dress;Put head through opening of pull over shirt/dress;Pull shirt over trunk Upper body dressing/undressing: 4: Steadying assist FIM - Lower Body Dressing/Undressing Lower body dressing/undressing steps patient completed: Thread/unthread left underwear leg;Thread/unthread left pants leg Lower body dressing/undressing: 2: Max-Patient completed 25-49% of tasks  FIM - Toileting Toileting: 0: Activity did not occur  FIM - Archivist Transfers: 4-To toilet/BSC: Min A (steadying Pt. > 75%)  FIM - Bed/Chair Transfer Bed/Chair Transfer Assistive Devices: Therapist, occupational: 4: Chair or W/C > Bed: Min A (steadying Pt. > 75%);4: Sit > Supine: Min A (steadying pt. > 75%/lift 1 leg);4: Supine > Sit: Min A (steadying Pt. > 75%/lift 1 leg);4: Bed > Chair or W/C: Min A (steadying Pt. > 75%)  FIM - Locomotion: Wheelchair Locomotion: Wheelchair: 0: Activity did not occur FIM - Locomotion: Ambulation Locomotion: Ambulation Assistive Devices: Designer, industrial/product Ambulation/Gait Assistance: 4: Min assist Locomotion: Ambulation: 1: Travels less than 50 ft with minimal assistance (Pt.>75%)  Comprehension Comprehension Mode: Auditory Comprehension: 5-Understands basic 90% of the time/requires cueing < 10% of the time  Expression Expression Mode: Verbal Expression: 5-Expresses basic 90% of the time/requires cueing < 10% of the time.  Social Interaction Social Interaction: 5-Interacts appropriately 90% of the time - Needs monitoring or encouragement for participation or interaction.  Problem Solving Problem Solving: 4-Solves  basic 75 - 89% of the time/requires cueing 10 - 24% of the time  Memory Memory: 4-Recognizes or recalls 75 - 89% of the time/requires cueing 10 - 24% of the time  Medical Problem List and Plan:  1. Right subcapital femur fracture. Status post ORIF 06/18/2013 at [facility] Partial weightbearing right lower extremity, will check Xrays because of increased pain 2. DVT Prophylaxis/Anticoagulation: Continue Xarelto for deep vein thrombosis right gastrocnemius vein  3. Pain Management: Neurontin 200 mg 3 times daily, oxycodone immediate release 5 mg every 6 hours as needed. Monitor mental status  4. Mood/delirium/confusion. Limit narcotics as much as possible. Recent cranial CT scan negative. Mental status continues to improve and she appeared fairly functional today. Continue Klonopin 1 mg each bedtime and Cymbalta 60 mg daily. Provide emotional support  5. Neuropsych: This patient is capable of making decisions on her own behalf.  6. Acute blood loss anemia. Followup CBC.Hgb slightly improved Patient is Jehovah witness---no blood products. Continue iron supplement  7. Hypertension. Clonidine patch 0.1 mg weekly, Vasotec 20 mg daily, Plendil 5 mg daily. Monitor with increased mobility  8. Parkinson's disease versus movement disorder. Sinemet discontinued secondary to possible contributor of confusion  9. Diet controlled diabetes mellitus. Check blood sugars a.c. and at bedtime  10. Pneumonia. All antibiotics since discontinued. Oxygen saturations greater than 90% on room air. Latest chest x-ray chronic changes  no visible effusions or acute abnormality     LOS (Days) 3 A FACE TO FACE EVALUATION WAS PERFORMED  Altha Sweitzer E 06/28/2013, 7:38 AM

## 2013-06-28 NOTE — Care Management Note (Signed)
Inpatient Rehabilitation Center Individual Statement of Services  Patient Name:  Kim Nichols  Date:  06/28/2013  Welcome to the Inpatient Rehabilitation Center.  Our goal is to provide you with an individualized program based on your diagnosis and situation, designed to meet your specific needs.  With this comprehensive rehabilitation program, you will be expected to participate in at least 3 hours of rehabilitation therapies Monday-Friday, with modified therapy programming on the weekends.  Your rehabilitation program will include the following services:  Physical Therapy (PT), Occupational Therapy (OT), Speech Therapy (ST), 24 hour per day rehabilitation nursing, Case Management ( Social Worker), Rehabilitation Medicine, Nutrition Services and Pharmacy Services  Weekly team conferences will be held on Wednesday to discuss your progress.  Your Social Worker will talk with you frequently to get your input and to update you on team discussions.  Team conferences with you and your family in attendance may also be held.  Expected length of stay: 2 weeks Overall anticipated outcome: Supervision level  Depending on your progress and recovery, your program may change. Your Social Worker will coordinate services and will keep you informed of any changes. Your Child psychotherapist names and contact numbers are listed  below.  The following services may also be recommended but are not provided by the Inpatient Rehabilitation Center:   Driving Evaluations  Home Health Rehabiltiation Services  Outpatient Rehabilitatation Servives    Arrangements will be made to provide these services after discharge if needed.  Arrangements include referral to agencies that provide these services.  Your insurance has been verified to be:  UHC-Medicare Your primary doctor is:  Dr. Georgann Housekeeper  Pertinent information will be shared with your doctor and your insurance company.  Social Worker:  Dossie Der, [place]  [phone]  Information discussed with and copy given to patient by: Lucy Chris, 06/28/2013, 10:26 AM

## 2013-06-28 NOTE — Progress Notes (Signed)
Patient has rested quietly throughout the night.  No signs of discomfort present.  Affect relaxed.  O2 sats continue to range between 96-99% on RA.  Patient is not wearing O2.    Kim Nichols

## 2013-06-28 NOTE — Progress Notes (Signed)
Social Work Assessment and Plan Social Work Assessment and Plan  Patient Details  Name: Kim Nichols MRN: [id number] Date of Birth: Jan 28, 1929  Today's Date: 06/28/2013  Problem List:  Patient Active Problem List   Diagnosis Date Noted  . Femur fracture, right 06/28/2013  . Acute encephalopathy 06/22/2013  . Delirium 06/22/2013  . DVT of leg (deep venous thrombosis) 06/22/2013  . HTN (hypertension) 06/21/2013  . Hyperlipidemia 06/21/2013  . Anemia 06/21/2013  . Pneumonia 06/21/2013  . Acute respiratory failure 06/21/2013   Past Medical History:  Past Medical History  Diagnosis Date  . Hypertension   . Diabetes mellitus without complication   . Dyslipidemia   . History of gastroesophageal reflux (GERD)   . Osteoarthritis   . Anxiety disorder   . Chronic insomnia   . Incontinence of urine    Past Surgical History:  Past Surgical History  Procedure Laterality Date  . Knee surgery  4-11   Social History:  reports that she has never smoked. She has never used smokeless tobacco. She reports that she does not drink alcohol or use illicit drugs.  Family / Support Systems Marital Status: Widow/Widower Patient Roles: Parent ChildrenLou Miner  [phone]  [phone] Other Supports: Richarda Blade  [phone] Anticipated Caregiver: Stanton Kidney plans to come from [place] to spend two weeks with pt and her other daughter who is local will also assist Ability/Limitations of Caregiver: No limitations Caregiver Availability: Other (Comment) (two weeks-24 hr care) Family Dynamics: Close knit family who are supportive of one another.  Daughter's are supportive and invovled with Mom.  She feels very fortuante to have both of them.  Social History Preferred language: English Religion: Non-Denominational Cultural Background: Jehovah Witness-so no blood products Education: High School Read: Yes Write: Yes Employment Status: Retired Fish farm manager  Issues: No issues Guardian/Conservator: NOne-accroding to MD pt is capable of mkaing her own decisions.  She reports: " I am getting better each day. "   Abuse/Neglect Physical Abuse: Denies Verbal Abuse: Denies Sexual Abuse: Denies Exploitation of patient/patient's resources: Denies Self-Neglect: Denies  Emotional Status Pt's affect, behavior adn adjustment status: Pt is motivated to improve and regain her independence.  She is trying hard even though it is painful.  She realizes it will hurt but is hopeful each day it will get better.  She is a very independent lady. Recent Psychosocial Issues: Other medical issues-maintaining her weight bearing still hard for her. Pyschiatric History: History of anxiety disorder-takes meds for this wheich she feels helps her.  Deferred depression screen due to in pain and wanting pain meds, also just finished 2 hours of therpay and was tired.  Will monitor her and get input from team. Substance Abuse History: No issues  Patient / Family Perceptions, Expectations & Goals Pt/Family understanding of illness & functional limitations: Pt is able to explain her hip surgery and precautions.  She is still getting used to her PWB and it is tough hopping.  She is trying her best and feels it is going ok.  Daughter visit and talks with MD. Premorbid pt/family roles/activities: Mother, Grandmother, Retiree, Church member, etc Anticipated changes in roles/activities/participation: resume Pt/family expectations/goals: Pt states: " I want to be able to get around on my own, before I leave."    She still is foggy at times, but feels this is improving.  Community Resources [employer]: None Premorbid Home Care/DME Agencies: None Transportation available at discharge: Family Resource referrals recommended: Support group (specify)  Discharge Planning Living Arrangements: Alone  Support Systems: Children;Other relatives;Friends/neighbors;Church/faith  community Type of Residence: Private residence Insurance Resources: Media planner (specify) (UHC_Medicare) Financial Resources: Restaurant manager, fast food Screen Referred: No Living Expenses: Own Money Management: Patient Do you have any problems obtaining your medications?: No Home Management: Self Patient/Family Preliminary Plans: Return home with daughter coming form [place] to stay with her for two weeks.  Other local daughter will also assist and she has friends who have offered.  Hopefully she will be doing well after two week period.   Social Work Anticipated Follow Up Needs: HH/OP;Support Group  Clinical Impression Pleasant female who is motivated and willing push through her pain to regain her independence.  Supportive daughter's who will assist at discharge.  Aware two week length of stay.  Work on a discharge plan.  Lucy Chris 06/28/2013, 1:27 PM

## 2013-06-28 NOTE — Plan of Care (Signed)
Problem: RH PAIN MANAGEMENT Goal: RH STG PAIN MANAGED AT OR BELOW PT'S PAIN GOAL 3 or less on scale 0-10  Outcome: Not Progressing Reports pain of 5 after therapy session

## 2013-06-28 NOTE — Plan of Care (Signed)
Problem: RH SAFETY Goal: RH STG ADHERE TO SAFETY PRECAUTIONS W/ASSISTANCE/DEVICE STG Adhere to Safety Precautions With min Assistance/Device.  Outcome: Progressing Remembers weight bearing restrictions

## 2013-06-28 NOTE — Progress Notes (Signed)
Patient information reviewed and entered into eRehab system by Tanya Marvin, RN, CRRN, PPS Coordinator.  Information including medical coding and functional independence measure will be reviewed and updated through discharge.     Per nursing patient was given "Data Collection Information Summary for Patients in Inpatient Rehabilitation Facilities with attached "Privacy Act Statement-Health Care Records" upon admission.  

## 2013-06-28 NOTE — Progress Notes (Signed)
Occupational Therapy Session Note  Patient Details  Name: Kim Nichols MRN: [id number] Date of Birth: [birth year]  Today's Date: 06/28/2013 Time: 4098-1191 and 1345-1430 Time Calculation (min): 55 min and 45 min   Short Term Goals: Week 1:  OT Short Term Goal 1 (Week 1): Pt. will be supI with grooming  OT Short Term Goal 2 (Week 1): Pt. will be supervision with LB dressing OT Short Term Goal 3 (Week 1): Pt. will be supervision with LB bathing OT Short Term Goal 4 (Week 1): Pt. will transfer to toilet with minimal assist OT Short Term Goal 5 (Week 1): Pt. will stand at sink during ADL for 5 min at supervision level  Skilled Therapeutic Interventions/Progress Updates:    Session 1: Therapy session focused on ADL retraining. Pt ambulated from room to walk-in shower with RW and min assist while follow weight bearing precautions. Pt required cues to correct posture and stand more upright however limited as pt reported pain with this. Required min assist for sit<>stand transfers during self-care tasks. Pt required assist with threading of RLE into clothing and OT to introduce AE during next therapy session. Pt slightly fatigued during therapy session, requiring 2-3 rest breaks however O2 remained 92%+.   Session 2: Therapy session focused on use of AE to increased independence in dressing, standing balance, activity tolerance, and functional transfers the patient. Pt received supine in bed stating pain was a little better now that she had a break. Requested to complete toilet task and ambulated bed to toilet with RW and min assist as she followed weight bearing precautions. Completed toilet hygiene and clothing management with min assist for steadying balance. Ambulated back to w/c and OT provided verbal instructions and visual demonstration of using reacher and sock-aid to assist with don/doff socks. Pt don and doffed socks 2x each with slight increased time and was excited about being able to do this  herself. Educated pt on where to purchase these items. Engaged in standing balance activity of hang man with game on mirror place in superior visually field to facilitate upright standing posture. Pt tolerated approx 8 min in standing before requiring rest break. Completed sit<>stand transfers with min assist during activity.   Therapy Documentation Precautions:  Precautions Precautions: Fall Precaution Comments: h/o fall Restrictions Weight Bearing Restrictions: Yes RLE Weight Bearing: Partial weight bearing RLE Partial Weight Bearing Percentage or Pounds: 50 Other Position/Activity Restrictions: 50% General: General Amount of Missed OT Time (min): 5 Minutes Vital Signs: Therapy Vitals Pulse Rate: 97 BP: 104/60 mmHg Patient Position, if appropriate: Sitting Oxygen Therapy SpO2: 96 % O2 Device: None (Room air) Pain: Reported 3/10 pain with movement during first therapy session.  Reported 6/10 pain at end of second therapy session.    Other Treatments:    See FIM for current functional status  Therapy/Group: Individual Therapy  [name] 06/28/2013, 12:29 PM

## 2013-06-29 ENCOUNTER — Inpatient Hospital Stay (HOSPITAL_COMMUNITY): Payer: Medicare Other

## 2013-06-29 ENCOUNTER — Inpatient Hospital Stay (HOSPITAL_COMMUNITY): Payer: Medicare Other | Admitting: *Deleted

## 2013-06-29 ENCOUNTER — Inpatient Hospital Stay (HOSPITAL_COMMUNITY): Payer: Medicare Other | Admitting: Speech Pathology

## 2013-06-29 DIAGNOSIS — G92 Toxic encephalopathy: Secondary | ICD-10-CM

## 2013-06-29 DIAGNOSIS — D62 Acute posthemorrhagic anemia: Secondary | ICD-10-CM

## 2013-06-29 DIAGNOSIS — S72009A Fracture of unspecified part of neck of unspecified femur, initial encounter for closed fracture: Secondary | ICD-10-CM

## 2013-06-29 DIAGNOSIS — I10 Essential (primary) hypertension: Secondary | ICD-10-CM

## 2013-06-29 LAB — GLUCOSE, CAPILLARY
Glucose-Capillary: 146 mg/dL — ABNORMAL HIGH (ref 70–99)
Glucose-Capillary: 162 mg/dL — ABNORMAL HIGH (ref 70–99)

## 2013-06-29 NOTE — Plan of Care (Signed)
Problem: RH BLADDER ELIMINATION Goal: RH STG MANAGE BLADDER WITH ASSISTANCE STG Manage Bladder With Assistance. Mod I  Outcome: Not Progressing Pt has hx of incontinence and urgency. Using brief and bedpan at night due to urgency, accident x1 tonight.

## 2013-06-29 NOTE — Progress Notes (Signed)
Physical Therapy Session Note  Patient Details  Name: Kim Nichols MRN: [id number] Date of Birth: [birth year]  Today's Date: 06/29/2013 Time: 0830-0930 Time Calculation (min): 60 min  Short Term Goals: Week 1:  PT Short Term Goal 1 (Week 1): STGs=LTGs  Skilled Therapeutic Interventions/Progress Updates:    Patient received supine in bed, reports incontinent episode. Also reports she has not had pain meds yet, RN notified. Patient requires mod assist for supine to sit with HOB flat and use of bed rails. Patient ambulates 15' to bathroom with RW and min assist, requires min A for transfer to toilet with use of grab bars. Patient performs sit>stand from toilet with min assist and requires total assist for donning of new brief. Patient ambulates 15' bathroom to bed and sits edge of bed to await pain medication from RN. Patient performs sit<>stand x1 with min A and RW before RN arrives with pain medication. Patient requires repeated verbal and tactile cues for proper hand placement during transfers (patient tends to reach for RW).  Remainder of session focused on gait training (in controlled and home environments on both tile and carpet surfaces) and bed mobility from flat bed without bedrails in ADL apartment. With mod verbal cues for sequencing and technqiue, patient able to perform sit<>supine from flat bed without bedrails with supervision when transferring sit>R sidelying>supine. Patient requires minA for sit<>supine when transferring sit>L sidelying>supine. Patient reports she can get in/out of either side of the bed at home and educated patient going to R side>supine with be easiest because her R LE does not have to be lifted as far of a distance.  Patient returned to room and left seated in wheelchair with all needs within reach.  Therapy Documentation Precautions:  Precautions Precautions: Fall Precaution Comments: h/o fall Restrictions Weight Bearing Restrictions: Yes RLE Weight Bearing:  Partial weight bearing RLE Partial Weight Bearing Percentage or Pounds: 50 Other Position/Activity Restrictions: 50% Vital Signs: Therapy Vitals Pulse Rate: 78 BP: 132/73 mmHg Patient Position, if appropriate: Sitting Oxygen Therapy SpO2: 96 % O2 Device: None (Room air) Pulse Oximetry Type: Intermittent Pain: Pain Assessment Pain Assessment: 0-10 Pain Score:   6 Pain Type: Surgical pain Pain Location: Hip Pain Orientation: Right Pain Descriptors / Indicators: Aching Pain Onset: On-going Pain Intervention(s): RN made aware;Ambulation/increased activity;Repositioned Multiple Pain Sites: No Locomotion : Ambulation Ambulation: Yes Ambulation/Gait Assistance: 4: Min assist Ambulation Distance (Feet): 35 Feet Assistive device: Rolling walker Ambulation/Gait Assistance Details: Verbal cues for technique;Verbal cues for precautions/safety;Tactile cues for posture Ambulation/Gait Assistance Details: Patient instructed in gait training 35' x1, 27' x2 (15' of each bout on carpet in home environment). Gait Gait: Yes Gait Pattern: Step-to pattern;Decreased step length - left;Decreased stride length;Antalgic;Trunk flexed   See FIM for current functional status  Therapy/Group: Individual Therapy  Chipper Herb. [place] Yearby, PT, DPT  06/29/2013, 12:08 PM

## 2013-06-29 NOTE — Progress Notes (Signed)
Occupational Therapy Session Note  Patient Details  Name: Kim Nichols MRN: [id number] Date of Birth: November 25, 1929  Today's Date: 06/29/2013 Time: 0930-1030 and 4132-4401 Time Calculation (min): 60 min and 30 min   Short Term Goals: Week 1:  OT Short Term Goal 1 (Week 1): Pt. will be supI with grooming  OT Short Term Goal 2 (Week 1): Pt. will be supervision with LB dressing OT Short Term Goal 3 (Week 1): Pt. will be supervision with LB bathing OT Short Term Goal 4 (Week 1): Pt. will transfer to toilet with minimal assist OT Short Term Goal 5 (Week 1): Pt. will stand at sink during ADL for 5 min at supervision level  Skilled Therapeutic Interventions/Progress Updates:    Session 1: Therapy session focused on ADL retraining, carryover with AE, functional transfers, following WB precautions. Pt requested to complete sponge bath on this date secondary to fatigue and pain from previous therapy session. Good carryover with reacher and sock-aid to assist with LB dressing as pt able to complete with increased time and min verbal cues. Required min-steadying assist for sit<>stand transfers from w/c then pt able to manage clothing around waist with steadying assist. Pt completed oral care in standing with steadying assist. Completed toilet transfer by ambulating from room to toilet with min assist using RW and increased time. Used 3-in-1 toilet and pt reported thinking it was "nice to have." Discussed home setup with pt. Pt reports doing light meal cooking (e.g microwave, easy stovetop, cereal). Pt does some laundry however reported daughter from [place] will be here for first 2 weeks following discharge then she has another daughter nearby who can assist. Pt used to do grocery shopping however reported daughter and friends will assist. At end of therapy session pt requested to lie in bed to assist with pain relief. Required min assist to manage RLE onto bed with sit>supine transfer. Pt followed weight bearing  precautions throughout therapy session.  Session 2: Therapy session focused on functional transfers, safety with home management tasks, standing balance, and activity tolerance. Pt received supine in bed and requested to complete toilet task before leaving room. Required min assist to ambulate from bed to toilet with RW. Completed clothing management and hygiene with steadying assist. Ambulated to sink to wash hands then required rest break in w/c prior to leaving room. Engaged home management task of retrieving items from overhead cabinet then practicing sliding them down the counter to increase safety. Pt required 2 rest breaks during this activity and completed with steadying assist using RW. Pt reported she does keep frequently used items on higher cabinets and educated her on keeping most used items in easiest to reach places. Discussed home setup and pt agreed it would be much more safe to have a walker tray to assist with transporting plates, etc to her dinner table where she intends on continuing to eat. Pt very fatigued at end of therapy session and requested to return to bed. Completed stand pivot transfer with min assist and required min assist to manage RLE onto bed for sit>supine transfer.   Therapy Documentation Precautions:  Precautions Precautions: Fall Precaution Comments: h/o fall Restrictions Weight Bearing Restrictions: Yes RLE Weight Bearing: Partial weight bearing RLE Partial Weight Bearing Percentage or Pounds: 50 Other Position/Activity Restrictions: 50% General:   Vital Signs: Therapy Vitals Pulse Rate: 78 BP: 132/73 mmHg Patient Position, if appropriate: Sitting Oxygen Therapy SpO2: 96 % O2 Device: None (Room air) Pulse Oximetry Type: Intermittent Pain: 6/10 c/o of pain at  incision. Pt reported pain 5/10 during session 2.    See FIM for current functional status  Therapy/Group: Individual Therapy  [name] 06/29/2013, 11:27 AM

## 2013-06-29 NOTE — Progress Notes (Signed)
Patient ID: Kim Nichols, female   DOB: 08/22/1929, 79 y.o.   MRN: [id number] Subjective/Complaints: No complaints placed on O2 last noc for Desat No problems with sleep , particpated in PT yesterday without c/o of pain  Objective: Vital Signs: Blood pressure 116/55, pulse 67, temperature 97.6 F (36.4 C), temperature source Oral, resp. rate 18, height 5\' 4"  (1.626 m), weight 63.5 kg (139 lb 15.9 oz), SpO2 94.00%. Dg Hip Complete Right  06/27/2013   *RADIOLOGY REPORT*  Clinical Data: Postop pain  RIGHT HIP - COMPLETE 2+ VIEW  Comparison: None.  Findings: Three screws are seen transfixing a right femoral neck fracture.  No breakage or loosening of the hardware.  Osteopenia. No definite acute fracture.  Degenerative changes in the lumbar spine are present.  IMPRESSION: Status post ORIF right femoral neck fracture.  No acute bony pathology.   Original Report Authenticated By: Jolaine Click, M.D.   Results for orders placed during the hospital encounter of 06/25/13 (from the past 72 hour(s))  GLUCOSE, CAPILLARY     Status: Abnormal   Collection Time    06/26/13 11:25 AM      Result Value Range   Glucose-Capillary 155 (*) 70 - 99 mg/dL   Comment 1 Notify RN    GLUCOSE, CAPILLARY     Status: Abnormal   Collection Time    06/26/13  4:24 PM      Result Value Range   Glucose-Capillary 131 (*) 70 - 99 mg/dL   Comment 1 Notify RN    GLUCOSE, CAPILLARY     Status: Abnormal   Collection Time    06/26/13  7:47 PM      Result Value Range   Glucose-Capillary 170 (*) 70 - 99 mg/dL  GLUCOSE, CAPILLARY     Status: Abnormal   Collection Time    06/27/13  6:06 AM      Result Value Range   Glucose-Capillary 135 (*) 70 - 99 mg/dL  GLUCOSE, CAPILLARY     Status: Abnormal   Collection Time    06/27/13 11:27 AM      Result Value Range   Glucose-Capillary 272 (*) 70 - 99 mg/dL   Comment 1 Notify RN    GLUCOSE, CAPILLARY     Status: Abnormal   Collection Time    06/27/13  4:26 PM      Result Value Range    Glucose-Capillary 226 (*) 70 - 99 mg/dL   Comment 1 Notify RN    GLUCOSE, CAPILLARY     Status: Abnormal   Collection Time    06/27/13  8:37 PM      Result Value Range   Glucose-Capillary 149 (*) 70 - 99 mg/dL  CBC WITH DIFFERENTIAL     Status: Abnormal   Collection Time    06/28/13  5:50 AM      Result Value Range   WBC 9.5  4.0 - 10.5 K/uL   RBC 2.78 (*) 3.87 - 5.11 MIL/uL   Hemoglobin 8.4 (*) 12.0 - 15.0 g/dL   HCT 78.2 (*) 95.6 - 21.3 %   MCV 91.7  78.0 - 100.0 fL   MCH 30.2  26.0 - 34.0 pg   MCHC 32.9  30.0 - 36.0 g/dL   RDW 08.6  57.8 - 46.9 %   Platelets 406 (*) 150 - 400 K/uL   Neutrophils Relative % 71  43 - 77 %   Neutro Abs 6.7  1.7 - 7.7 K/uL   Lymphocytes Relative 16  12 -  46 %   Lymphs Abs 1.5  0.7 - 4.0 K/uL   Monocytes Relative 13 (*) 3 - 12 %   Monocytes Absolute 1.2 (*) 0.1 - 1.0 K/uL   Eosinophils Relative 0  0 - 5 %   Eosinophils Absolute 0.0  0.0 - 0.7 K/uL   Basophils Relative 0  0 - 1 %   Basophils Absolute 0.0  0.0 - 0.1 K/uL  COMPREHENSIVE METABOLIC PANEL     Status: Abnormal   Collection Time    06/28/13  5:50 AM      Result Value Range   Sodium 137  135 - 145 mEq/L   Potassium 4.4  3.5 - 5.1 mEq/L   Chloride 99  96 - 112 mEq/L   CO2 31  19 - 32 mEq/L   Glucose, Bld 140 (*) 70 - 99 mg/dL   BUN 13  6 - 23 mg/dL   Creatinine, Ser 5.78  0.50 - 1.10 mg/dL   Calcium 8.8  8.4 - 46.9 mg/dL   Total Protein 6.0  6.0 - 8.3 g/dL   Albumin 2.6 (*) 3.5 - 5.2 g/dL   AST 21  0 - 37 U/L   ALT 19  0 - 35 U/L   Alkaline Phosphatase 59  39 - 117 U/L   Total Bilirubin 0.4  0.3 - 1.2 mg/dL   GFR calc non Af Amer 63 (*) >90 mL/min   GFR calc Af Amer 73 (*) >90 mL/min   Comment:            The eGFR has been calculated     using the CKD EPI equation.     This calculation has not been     validated in all clinical     situations.     eGFR's persistently     <90 mL/min signify     possible Chronic Kidney Disease.  GLUCOSE, CAPILLARY     Status: Abnormal    Collection Time    06/28/13  7:24 AM      Result Value Range   Glucose-Capillary 140 (*) 70 - 99 mg/dL  GLUCOSE, CAPILLARY     Status: Abnormal   Collection Time    06/28/13 11:27 AM      Result Value Range   Glucose-Capillary 183 (*) 70 - 99 mg/dL  GLUCOSE, CAPILLARY     Status: Abnormal   Collection Time    06/28/13  4:38 PM      Result Value Range   Glucose-Capillary 167 (*) 70 - 99 mg/dL  GLUCOSE, CAPILLARY     Status: Abnormal   Collection Time    06/28/13  9:06 PM      Result Value Range   Glucose-Capillary 195 (*) 70 - 99 mg/dL  GLUCOSE, CAPILLARY     Status: Abnormal   Collection Time    06/29/13  7:09 AM      Result Value Range   Glucose-Capillary 136 (*) 70 - 99 mg/dL   Comment 1 Notify RN       HEENT: Poor dentition Cardio: RRR and No murmurs Resp: CTA B/L and Crackles at both bases GI: BS positive and Non-distended Extremity:  Pulses positive and No Edema Skin:   Wound C/D/I and Right hip with staples lateral aspect, no drainage, no ecchymosis Neuro: Alert/Oriented, Cranial Nerve II-XII normal, Normal Sensory, Abnormal Motor 5/5 bilateral deltoid, bicep, tricep, grip, 2 minus right hip flexor 3 minus right knee extensor for bilateral ankle dorsiflexor plantar flexor and left proximal  and Tone:  Within Normal Limits Musc/Skel:  Swelling Right thigh and right calf General no acute distress Hip pain with reduced passive range of motion  Assessment/Plan: 1. Functional deficits secondary to right subcapital hip fracture with post operative pneumonia and DVT which require 3+ hours per day of interdisciplinary therapy in a comprehensive inpatient rehab setting. Physiatrist is providing close team supervision and 24 hour management of active medical problems listed below. Physiatrist and rehab team continue to assess barriers to discharge/monitor patient progress toward functional and medical goals. FIM: FIM - Bathing Bathing Steps Patient Completed: Chest;Right  Arm;Left Arm;Front perineal area;Right upper leg;Right lower leg (including foot);Abdomen;Left upper leg Bathing: 4: Min-Patient completes 8-9 80f 10 parts or 75+ percent  FIM - Upper Body Dressing/Undressing Upper body dressing/undressing steps patient completed: Thread/unthread right sleeve of pullover shirt/dresss;Thread/unthread left sleeve of pullover shirt/dress;Put head through opening of pull over shirt/dress;Pull shirt over trunk Upper body dressing/undressing: 5: Set-up assist to: Obtain clothing/put away FIM - Lower Body Dressing/Undressing Lower body dressing/undressing steps patient completed: Thread/unthread left underwear leg;Pull underwear up/down;Thread/unthread left pants leg;Pull pants up/down;Don/Doff left sock Lower body dressing/undressing: 3: Mod-Patient completed 50-74% of tasks  FIM - Toileting Toileting steps completed by patient: Adjust clothing prior to toileting;Performs perineal hygiene;Adjust clothing after toileting Toileting Assistive Devices: Grab bar or rail for support Toileting: 4: Steadying assist  FIM - Diplomatic Services operational officer Devices: Grab bars;Walker Toilet Transfers: 4-To toilet/BSC: Min A (steadying Pt. > 75%);4-From toilet/BSC: Min A (steadying Pt. > 75%)  FIM - Banker Devices: Walker;Arm rests;Bed rails Bed/Chair Transfer: 4: Supine > Sit: Min A (steadying Pt. > 75%/lift 1 leg);4: Sit > Supine: Min A (steadying pt. > 75%/lift 1 leg);4: Chair or W/C > Bed: Min A (steadying Pt. > 75%)  FIM - Locomotion: Wheelchair Distance: 55 Locomotion: Wheelchair: 2: Travels 50 - 149 ft with minimal assistance (Pt.>75%) FIM - Locomotion: Ambulation Locomotion: Ambulation Assistive Devices: Designer, industrial/product Ambulation/Gait Assistance: 4: Min assist Locomotion: Ambulation: 1: Travels less than 50 ft with minimal assistance (Pt.>75%)  Comprehension Comprehension Mode: Auditory Comprehension:  5-Follows basic conversation/direction: With extra time/assistive device  Expression Expression Mode: Verbal Expression: 5-Expresses complex 90% of the time/cues < 10% of the time  Social Interaction Social Interaction: 6-Interacts appropriately with others with medication or extra time (anti-anxiety, antidepressant).  Problem Solving Problem Solving: 4-Solves basic 75 - 89% of the time/requires cueing 10 - 24% of the time  Memory Memory: 5-Recognizes or recalls 90% of the time/requires cueing < 10% of the time  Medical Problem List and Plan:  1. Right subcapital femur fracture. Status post ORIF 06/18/2013 at [facility]. Partial weightbearing right lower extremity, will check Xrays because of increased pain 2. DVT Prophylaxis/Anticoagulation: Continue Xarelto for deep vein thrombosis right gastrocnemius vein  3. Pain Management: Neurontin 200 mg 3 times daily, oxycodone immediate release 5 mg every 6 hours as needed. Monitor mental status  4. Mood/delirium/confusion. Limit narcotics as much as possible. Recent cranial CT scan negative. Mental status continues to improve and she appeared fairly functional today. Continue Klonopin 1 mg each bedtime and Cymbalta 60 mg daily. Provide emotional support  5. Neuropsych: This patient is capable of making decisions on her own behalf.  6. Acute blood loss anemia. Followup CBC.Hgb slightly improved Patient is Jehovah witness---no blood products. Continue iron supplement  7. Hypertension. Clonidine patch 0.1 mg weekly, Vasotec 20 mg daily, Plendil 5 mg daily. Monitor with increased mobility  8. Parkinson's  disease versus movement disorder. Sinemet discontinued secondary to possible contributor of confusion  9. Diet controlled diabetes mellitus. Check blood sugars a.c. and at bedtime  10. Pneumonia. All antibiotics since discontinued. Oxygen saturations greater than 90% on room air except at noc. Latest chest x-ray chronic changes no  visible effusions or acute abnormality , will repeat    LOS (Days) 4 A FACE TO FACE EVALUATION WAS PERFORMED  Mallorey Odonell E 06/29/2013, 8:48 AM

## 2013-06-29 NOTE — Progress Notes (Signed)
Speech Language Pathology Daily Session Note  Patient Details  Name: Kim Nichols MRN: [id number] Date of Birth: 03/24/1929  Today's Date: 06/29/2013 Time: 1120-1205 Time Calculation (min): 45 min  Short Term Goals: Week 1: SLP Short Term Goal 1 (Week 1): Pt will demonstrate functional problem solving for mildly complex tasks with supervision verbal cues.  SLP Short Term Goal 1 - Progress (Week 1): Progressing toward goal SLP Short Term Goal 2 (Week 1): Pt will utilize external memory aids to recall new, daily information with supervision question cues.  SLP Short Term Goal 2 - Progress (Week 1): Progressing toward goal SLP Short Term Goal 3 (Week 1): Pt will identify 2 cognitive impairments with supervision question and semantic cues.  SLP Short Term Goal 3 - Progress (Week 1): Progressing toward goal SLP Short Term Goal 4 (Week 1): Pt will utilize call bell to express wants/needs with supervision question cues.  SLP Short Term Goal 4 - Progress (Week 1): Met  Skilled Therapeutic Interventions: Pt's cognitive status improved from yesterday.  Engaged in check-writing/account balancing activity with min assist to organize information and recall necessary steps for completion.  Pt initiating efforts to self-correct and re-examine work for accuracy.  Recalled when last pains meds administered, but min cues required to identify correct time for next dose.  Pt with improving awareness of deficits; asks appropriate questions re: care and outcomes.     FIM:  Comprehension Comprehension Mode: Auditory Comprehension: 5-Understands complex 90% of the time/Cues < 10% of the time Expression Expression Mode: Verbal Expression: 5-Expresses complex 90% of the time/cues < 10% of the time Social Interaction Social Interaction: 6-Interacts appropriately with others with medication or extra time (anti-anxiety, antidepressant). Problem Solving Problem Solving: 4-Solves basic 75 - 89% of the time/requires  cueing 10 - 24% of the time Memory Memory: 5-Recognizes or recalls 90% of the time/requires cueing < 10% of the time  Pain Pain Assessment Pain Assessment: 0-10 Pain Score:   6 Pain Type: Surgical pain Pain Location: Hip Pain Orientation: Right Pain Descriptors / Indicators: Aching Pain Onset: On-going Pain Intervention(s): Repositioned Multiple Pain Sites: No  Therapy/Group: Individual Therapy  Blenda Mounts Laurice 06/29/2013, 1:58 PM

## 2013-06-29 NOTE — Progress Notes (Signed)
SpO2 was monitored last night q4h as ordered. Before bedtime at 2113 SpO2=99 on RA. Pt was alert and awake. At 0200 SpO2 is fluctuating from 79-89% on RA, patient is fast asleep with noted "mouth breathing", 2L [place] was applied SpO2 was maintained at 93%. At 630, pt was tried on RA again and it again fluctuates from 72%-87% when asleep. 2L [place] applied and SpO2 maintained at 94%. Pt asleep will pass on to AM RN and leave note for MD.

## 2013-06-30 ENCOUNTER — Inpatient Hospital Stay (HOSPITAL_COMMUNITY): Payer: Medicare Other

## 2013-06-30 ENCOUNTER — Inpatient Hospital Stay (HOSPITAL_COMMUNITY): Payer: Medicare Other | Admitting: *Deleted

## 2013-06-30 LAB — GLUCOSE, CAPILLARY: Glucose-Capillary: 148 mg/dL — ABNORMAL HIGH (ref 70–99)

## 2013-06-30 MED ORDER — BISACODYL 10 MG RE SUPP
10.0000 mg | Freq: Every day | RECTAL | Status: DC | PRN
Start: 2013-06-30 — End: 2013-07-09
  Administered 2013-07-01: 10 mg via RECTAL
  Filled 2013-06-30: qty 1

## 2013-06-30 MED ORDER — FLEET ENEMA 7-19 GM/118ML RE ENEM
1.0000 | ENEMA | Freq: Every day | RECTAL | Status: DC | PRN
  Filled 2013-06-30: qty 1

## 2013-06-30 MED ORDER — POLYETHYLENE GLYCOL 3350 17 G PO PACK
17.0000 g | PACK | Freq: Every day | ORAL | Status: DC
  Administered 2013-06-30 – 2013-07-07 (×7): 17 g via ORAL
  Filled 2013-06-30 (×11): qty 1

## 2013-06-30 NOTE — Plan of Care (Signed)
Problem: RH Tub/Shower Transfers Goal: LTG Patient will perform tub/shower transfers w/assist (OT) LTG: Patient will perform tub/shower transfers with assist, with/without cues using equipment (OT)  Outcome: Not Applicable Date Met:  06/30/13 N/A as pt reports she will be using walk-in shower and already has shower chair

## 2013-06-30 NOTE — Plan of Care (Signed)
Problem: RH BOWEL ELIMINATION Goal: RH STG MANAGE BOWEL W/MEDICATION W/ASSISTANCE STG Manage Bowel with Medication with Assistance. Mod I  Outcome: Not Progressing LBM 6/29. PRN medication given, may need scheduled bowel med.

## 2013-06-30 NOTE — Progress Notes (Signed)
Physical Therapy Session Note  Patient Details  Name: Kim Nichols MRN: [id number] Date of Birth: 02/12/1929  Today's Date: 06/30/2013 Time: 4098-1191 and 1130-1204 Time Calculation (min): 52 min and 34 min  Short Term Goals: Week 1:  PT Short Term Goal 1 (Week 1): STGs=LTGs  Skilled Therapeutic Interventions/Progress Updates:    First session: Patient received supine in bed. Patient reports she has not had pain meds yet, RN notified. Patient requires min assist for supine to sit with HOB flat and use of bed rails. Patient ambulates 15' to bathroom with RW and min assist, requires min A for transfer to toilet with use of grab bars. Patient performs sit>stand from toilet with min assist and is able to perform hygiene and manage brief without assistance. Patient ambulates 15' bathroom to bed and sits edge of bed to await pain medication from RN. Patient demonstrates improved ability to recall proper hand placement during transfers (patient was tending to reach for RW).  Remainder of session focused on sit<>stand transfers, gait training and stair negotiation, see details below for gait/stairs. Patient performed x5 sit<>stand transfers from wheelchair, initially requiring min assist, progressing to supervision. Patient continues to demonstrate difficulty with sit>stand transfers from lower surfaces and compliant surfaces (bed). Patient returned to room and left seated in wheelchair with all needs within reach.  Second session: Patient received supine in bed. Session focused on gait training, functional transfers, and R LE strengthening. See details below for gait training. Patient transferred wheelchair>mat with RW and min assist, sit>supine (on R side) with supervision. In supine, patient instructed in R quad sets with 3" hold x15 and hip abduction x10 (initially requires assist to move, then progresses to no assistance). Patient requires verbal cues for breathing as she tends to hold her breath during  exercises. Patient supine>sit with supervision, stand pivot mat>wheelchair with RW and min assist. Patient returned to room and left sitting in wheelchair with all needs within reach.  Therapy Documentation Precautions:  Precautions Precautions: Fall Precaution Comments: h/o fall Restrictions Weight Bearing Restrictions: Yes RLE Weight Bearing: Partial weight bearing RLE Partial Weight Bearing Percentage or Pounds: 50 Other Position/Activity Restrictions: 50% General: Amount of Missed PT Time (min): 8 Minutes Missed Time Reason: Other (comment) (therapist late) Vital Signs: Therapy Vitals BP: 152/61 mmHg Pain: Pain Assessment Pain Assessment: 0-10 Pain Score: 6  Pain Type: Surgical pain Pain Location: Hip Pain Orientation: Right Pain Descriptors / Indicators: Aching Pain Onset: On-going Pain Intervention(s): Ambulation/increased activity;Repositioned Multiple Pain Sites: No Locomotion : Ambulation Ambulation: Yes Ambulation/Gait Assistance: 4: Min assist;4: Min guard Ambulation Distance (Feet): 61 Feet x1, 51'x1 (in first session); 58' x1 (in second session) Assistive device: Rolling walker Ambulation/Gait Assistance Details: Verbal cues for technique;Verbal cues for precautions/safety;Tactile cues for posture Ambulation/Gait Assistance Details: Patient instructed in gait training 14' x1 and 51' x1 in controlled environment (in first session); 15' x1 in controlled environment. Gait Gait: Yes Gait Pattern: Impaired Gait Pattern: Step-to pattern;Decreased step length - left;Decreased stride length;Antalgic;Trunk flexed Stairs / Additional Locomotion Stairs: Yes Stairs Assistance: 4: Min assist Stairs Assistance Details: Verbal cues for sequencing;Verbal cues for technique;Verbal cues for safe use of DME/AE Stairs Assistance Details (indicate cue type and reason): Ascends backwards, descends forwards Stair Management Technique: No rails;Backwards;Forwards;With  walker Number of Stairs: 1 stair x2 Height of Stairs: 6 Curb: 4: Min assist (with RW)   See FIM for current functional status  Therapy/Group: Individual Therapy  Chipper Herb. Viviano Bir, PT, DPT  06/30/2013, 10:34 AM

## 2013-06-30 NOTE — Progress Notes (Signed)
Social Work Patient ID: Kim Nichols, female   DOB: 1929/12/21, 79 y.o.   MRN: [id number] Met with pt and spoke with daughter-Deb in [facility] to inform of team conference goals-supervision level and discharge 7/11.  Thhis will give daughter time to make arrangements to Get here.  Discussed checking O2 sats at night seem to be dropping and pt is requiring O2 at night.  Will continue to monitor and see if home O2 needed at home.  Pt is pleased with her Progress, daughter can hear she is making daily progress.

## 2013-06-30 NOTE — Patient Care Conference (Signed)
Inpatient RehabilitationTeam Conference and Plan of Care Update Date: 06/30/2013   Time: 11:15 Am    Patient Name: Kim Nichols      Medical Record Number: [id number]  Date of Birth: Dec 21, 1929 Sex: Female         Room/Bed: 4145/4145-01 Payor Info: Payor: Advertising copywriter MEDICARE / Plan: AARP MEDICARE COMPLETE / Product Type: *No Product type* /    Admitting Diagnosis: Hip Enceph ORIF  Admit Date/Time:  06/25/2013  2:13 PM Admission Comments: No comment available   Primary Diagnosis:  Femur fracture, right Principal Problem: Femur fracture, right  Patient Active Problem List   Diagnosis Date Noted  . Femur fracture, right 06/28/2013  . Acute encephalopathy 06/22/2013  . Delirium 06/22/2013  . DVT of leg (deep venous thrombosis) 06/22/2013  . HTN (hypertension) 06/21/2013  . Hyperlipidemia 06/21/2013  . Anemia 06/21/2013  . Pneumonia 06/21/2013  . Acute respiratory failure 06/21/2013    Expected Discharge Date: Expected Discharge Date: 07/09/13  Team Members Present: Physician leading conference: Dr. Claudette Laws Social Worker Present: Dossie Der, LCSW Nurse Present: Other (comment) Milly Jakob Rcom-RN) PT Present: Edman Circle, PT;Caroline Adriana Simas, PT;Other (comment) Clarisse Gouge Ripa-PT) OT Present: Other (comment);Leonette Monarch, Felipa Eth, OT (Kayla Perkinson-OT) SLP Present: Feliberto Gottron, SLP Other (Discipline and Name): Jacqlyn Larsen     Current Status/Progress Goal Weekly Team Focus  Medical   Intermittent confusion, intermittent shortness of breath, improved chest x-ray, improving pain right hip  Assess cognition, continue rehabilitation program  Neuropsych evaluation   Bowel/Bladder   Continent of bowel and bladder. LBM 06/27/13  Pt to remain continent of bowel and bladder.  Monitor   Swallow/Nutrition/ Hydration     na        ADL's   steadying-close supervision for self-care and functional tasks, increased time with AE, rest breaks d/t decreased activity  tolerance  supervision   standing balance, activity tolerance, functional transfers, education, carryover with AE   Mobility   min-mod transfers, min A gait x30', S bed mobility  S overall  PWB status, safety, bed mobility, transfers, gait, stairs, strengthening   Communication     na        Safety/Cognition/ Behavioral Observations  Min A-Supervision  Mod I  working memory, problem solving, awareness   Pain   Oxy IR 5mg  q 4hrs prn  <5  Offer pain medication 1hr prior to initial therapy   Skin   R hip incision approximated with staples, OTA, unremarkable  No additional skin breakdown  Routine turn q 2hrs      *See Care Plan and progress notes for long and short-term goals.  Barriers to Discharge: Still requiring physical assistance,    Possible Resolutions to Barriers:  Family from [place] planning to stay with patient post discharge. Will need training    Discharge Planning/Teaching Needs:  Home daughter coming from [facility] to stay with her for two weeks at discharge.      Team Discussion:  Making good improvements daily- monitor o2 sats at night dropping into the 70's.  Pain better.  Incision looks good.  Daughter coming from [facility] to assist  Revisions to Treatment Plan:  None   Continued Need for Acute Rehabilitation Level of Care: The patient requires daily medical management by a physician with specialized training in physical medicine and rehabilitation for the following conditions: Daily direction of a multidisciplinary physical rehabilitation program to ensure safe treatment while eliciting the highest outcome that is of practical value to the patient.: Yes Daily medical management  of patient stability for increased activity during participation in an intensive rehabilitation regime.: Yes Daily analysis of laboratory values and/or radiology reports with any subsequent need for medication adjustment of medical intervention for : Pulmonary problems;Post surgical  problems;Other  Ramez Arrona, Lemar Livings 06/30/2013, 3:21 PM

## 2013-06-30 NOTE — Progress Notes (Signed)
Occupational Therapy Session Note  Patient Details  Name: Kim Nichols MRN: [id number] Date of Birth: 1929/03/06  Today's Date: 06/30/2013 Time: 0930-1030 and 1330-1415 Time Calculation (min): 60 min and 45 min   Short Term Goals: Week 1:  OT Short Term Goal 1 (Week 1): Pt. will be supI with grooming  OT Short Term Goal 2 (Week 1): Pt. will be supervision with LB dressing OT Short Term Goal 3 (Week 1): Pt. will be supervision with LB bathing OT Short Term Goal 4 (Week 1): Pt. will transfer to toilet with minimal assist OT Short Term Goal 5 (Week 1): Pt. will stand at sink during ADL for 5 min at supervision level  Skilled Therapeutic Interventions/Progress Updates:    Session 1: Therapy session focused on ADL retraining, carryover with AE, functional transfers, and activity tolerance. Pt completed shower on this date and used LH sponge for lower BLE. Pt requires min assist for sit<>stand during self-care tasks and steadying assist for balance with LB self care tasks. Good carryover with use of reacher and sock-aid for LB dressing requiring only 2 verbal cues and increased time. Pt reports being incontinent at night however is continent throughout day. Pt required approx 3-4 rest breaks during therapy session. Pt fatigued at end of session and requested to lie in bed until next session (1 hour later). Pt required assist to lift RLE onto bed to transfer sit>supine.  Session 2: Therapy session focused on activity tolerance, sit<>stand, and functional transfers. Pt received supine in bed and required min assist to manage RLE off bed. Ambulated short distances in room with steadying-supervision with RW to retrieve dirty clothing for laundry task. OT propelled pt to laundry room and pt carried bag with RW and transported to washing machine. Loaded laundry with steadying assist as pt had slight lob on one occasion. Ambulated back to w/c for rest break. Engaged in dynamic standing activity of watering  plants as pt side stepped down counter with steadying-supervision to water all plants. Required rest break then engaged in therapeutic activity in standing with steadying assist. Pt complete 75% of sit<>stand transfers on this date with supervision, requiring min assist for the other 25%. Pt fatigued at end of session and requested to return to bed. Completed sit<>supine with min assist to manage RLE.     Therapy Documentation Precautions:  Precautions Precautions: Fall Precaution Comments: h/o fall Restrictions Weight Bearing Restrictions: Yes RLE Weight Bearing: Partial weight bearing RLE Partial Weight Bearing Percentage or Pounds: 50 Other Position/Activity Restrictions: 50% General: General Missed Time Reason: Other (comment) (therapist late) Vital Signs: Therapy Vitals BP: 152/61 mmHg Pain: Pt reported pain 5/10 on R hip at site of incision at beginning of therapy session however no c/o of pain throughout session.  Pt reported pain 6/10 on R hip at incision at beginning of session 2.   See FIM for current functional status  Therapy/Group: Individual Therapy  [name] 06/30/2013, 11:42 AM

## 2013-06-30 NOTE — Progress Notes (Signed)
Patient ID: Kim Nichols, female   DOB: 01-13-1929, 79 y.o.   MRN: [id number] Subjective/Complaints: No complaints placed on O2 last noc for Desat No problems with sleep , particpated in PT yesterday without c/o of pain Patient complains of increasing anxiety over the course of several years. Has never seen a psychologist No cough or SOB Review of Systems  Constitutional: Negative for fever and chills.  Respiratory: Negative for cough.   Musculoskeletal: Positive for joint pain.  Neurological: Positive for focal weakness.  All other systems reviewed and are negative.    Objective: Vital Signs: Blood pressure 152/61, pulse 68, temperature 98 F (36.7 C), temperature source Oral, resp. rate 17, height 5\' 4"  (1.626 m), weight 63.5 kg (139 lb 15.9 oz), SpO2 93.00%. Dg Chest 2 View  06/29/2013   *RADIOLOGY REPORT*  Clinical Data: The pneumonia, shortness of breath  CHEST - 2 VIEW  Comparison: 06/23/2013  Findings: Heart size and vascular pattern are normal.  Mild residual interstitial prominence and peribronchial thickening is detected, significantly improved when compared to the prior study. Minimal infiltrate in the lateral left lung base is now present. No definite pleural effusion.  IMPRESSION: Persistent but significantly improved interstitial process bilaterally.   Original Report Authenticated By: Esperanza Heir, M.D.   Results for orders placed during the hospital encounter of 06/25/13 (from the past 72 hour(s))  GLUCOSE, CAPILLARY     Status: Abnormal   Collection Time    06/27/13 11:27 AM      Result Value Range   Glucose-Capillary 272 (*) 70 - 99 mg/dL   Comment 1 Notify RN    GLUCOSE, CAPILLARY     Status: Abnormal   Collection Time    06/27/13  4:26 PM      Result Value Range   Glucose-Capillary 226 (*) 70 - 99 mg/dL   Comment 1 Notify RN    GLUCOSE, CAPILLARY     Status: Abnormal   Collection Time    06/27/13  8:37 PM      Result Value Range   Glucose-Capillary 149 (*) 70  - 99 mg/dL  CBC WITH DIFFERENTIAL     Status: Abnormal   Collection Time    06/28/13  5:50 AM      Result Value Range   WBC 9.5  4.0 - 10.5 K/uL   RBC 2.78 (*) 3.87 - 5.11 MIL/uL   Hemoglobin 8.4 (*) 12.0 - 15.0 g/dL   HCT 13.2 (*) 44.0 - 10.2 %   MCV 91.7  78.0 - 100.0 fL   MCH 30.2  26.0 - 34.0 pg   MCHC 32.9  30.0 - 36.0 g/dL   RDW 72.5  36.6 - 44.0 %   Platelets 406 (*) 150 - 400 K/uL   Neutrophils Relative % 71  43 - 77 %   Neutro Abs 6.7  1.7 - 7.7 K/uL   Lymphocytes Relative 16  12 - 46 %   Lymphs Abs 1.5  0.7 - 4.0 K/uL   Monocytes Relative 13 (*) 3 - 12 %   Monocytes Absolute 1.2 (*) 0.1 - 1.0 K/uL   Eosinophils Relative 0  0 - 5 %   Eosinophils Absolute 0.0  0.0 - 0.7 K/uL   Basophils Relative 0  0 - 1 %   Basophils Absolute 0.0  0.0 - 0.1 K/uL  COMPREHENSIVE METABOLIC PANEL     Status: Abnormal   Collection Time    06/28/13  5:50 AM      Result Value Range  Sodium 137  135 - 145 mEq/L   Potassium 4.4  3.5 - 5.1 mEq/L   Chloride 99  96 - 112 mEq/L   CO2 31  19 - 32 mEq/L   Glucose, Bld 140 (*) 70 - 99 mg/dL   BUN 13  6 - 23 mg/dL   Creatinine, Ser 1.61  0.50 - 1.10 mg/dL   Calcium 8.8  8.4 - 09.6 mg/dL   Total Protein 6.0  6.0 - 8.3 g/dL   Albumin 2.6 (*) 3.5 - 5.2 g/dL   AST 21  0 - 37 U/L   ALT 19  0 - 35 U/L   Alkaline Phosphatase 59  39 - 117 U/L   Total Bilirubin 0.4  0.3 - 1.2 mg/dL   GFR calc non Af Amer 63 (*) >90 mL/min   GFR calc Af Amer 73 (*) >90 mL/min   Comment:            The eGFR has been calculated     using the CKD EPI equation.     This calculation has not been     validated in all clinical     situations.     eGFR's persistently     <90 mL/min signify     possible Chronic Kidney Disease.  GLUCOSE, CAPILLARY     Status: Abnormal   Collection Time    06/28/13  7:24 AM      Result Value Range   Glucose-Capillary 140 (*) 70 - 99 mg/dL  GLUCOSE, CAPILLARY     Status: Abnormal   Collection Time    06/28/13 11:27 AM      Result  Value Range   Glucose-Capillary 183 (*) 70 - 99 mg/dL  GLUCOSE, CAPILLARY     Status: Abnormal   Collection Time    06/28/13  4:38 PM      Result Value Range   Glucose-Capillary 167 (*) 70 - 99 mg/dL  GLUCOSE, CAPILLARY     Status: Abnormal   Collection Time    06/28/13  9:06 PM      Result Value Range   Glucose-Capillary 195 (*) 70 - 99 mg/dL  GLUCOSE, CAPILLARY     Status: Abnormal   Collection Time    06/29/13  7:09 AM      Result Value Range   Glucose-Capillary 136 (*) 70 - 99 mg/dL   Comment 1 Notify RN    GLUCOSE, CAPILLARY     Status: Abnormal   Collection Time    06/29/13 11:23 AM      Result Value Range   Glucose-Capillary 220 (*) 70 - 99 mg/dL   Comment 1 Notify RN    GLUCOSE, CAPILLARY     Status: Abnormal   Collection Time    06/29/13  4:23 PM      Result Value Range   Glucose-Capillary 146 (*) 70 - 99 mg/dL   Comment 1 Notify RN    GLUCOSE, CAPILLARY     Status: Abnormal   Collection Time    06/29/13  7:58 PM      Result Value Range   Glucose-Capillary 162 (*) 70 - 99 mg/dL   Comment 1 Notify RN    GLUCOSE, CAPILLARY     Status: Abnormal   Collection Time    06/30/13  7:43 AM      Result Value Range   Glucose-Capillary 158 (*) 70 - 99 mg/dL   Comment 1 Notify RN       HEENT: Poor dentition  Cardio: RRR and No murmurs Resp: CTA B/L and Crackles at both bases GI: BS positive and Non-distended Extremity:  Pulses positive and No Edema Skin:   Wound C/D/I and Right hip with staples lateral aspect, no drainage, no ecchymosis Neuro: Alert/Oriented, Cranial Nerve II-XII normal, Normal Sensory, Abnormal Motor 5/5 bilateral deltoid, bicep, tricep, grip, 2 minus right hip flexor 3 minus right knee extensor for bilateral ankle dorsiflexor plantar flexor and left proximal and Tone:  Within Normal Limits Musc/Skel:  Swelling Right thigh and right calf General no acute distress Hip pain with reduced passive range of motion  Assessment/Plan: 1. Functional deficits  secondary to right subcapital hip fracture with post operative pneumonia and DVT which require 3+ hours per day of interdisciplinary therapy in a comprehensive inpatient rehab setting. Physiatrist is providing close team supervision and 24 hour management of active medical problems listed below. Physiatrist and rehab team continue to assess barriers to discharge/monitor patient progress toward functional and medical goals. FIM: FIM - Bathing Bathing Steps Patient Completed: Chest;Right Arm;Left Arm;Front perineal area;Right upper leg;Abdomen;Left upper leg;Buttocks Bathing: 4: Steadying assist  FIM - Upper Body Dressing/Undressing Upper body dressing/undressing steps patient completed: Thread/unthread right sleeve of pullover shirt/dresss;Thread/unthread left sleeve of pullover shirt/dress;Put head through opening of pull over shirt/dress;Pull shirt over trunk Upper body dressing/undressing: 5: Set-up assist to: Obtain clothing/put away FIM - Lower Body Dressing/Undressing Lower body dressing/undressing steps patient completed: Thread/unthread left underwear leg;Pull underwear up/down;Thread/unthread left pants leg;Pull pants up/down;Don/Doff left sock;Thread/unthread right underwear leg;Thread/unthread right pants leg;Don/Doff right sock Lower body dressing/undressing: 4: Steadying Assist (used reacher and sock aid)  FIM - Toileting Toileting steps completed by patient: Adjust clothing prior to toileting;Performs perineal hygiene;Adjust clothing after toileting Toileting Assistive Devices: Grab bar or rail for support Toileting: 4: Steadying assist  FIM - Diplomatic Services operational officer Devices: Grab bars;Walker;Elevated toilet seat Toilet Transfers: 4-To toilet/BSC: Min A (steadying Pt. > 75%);4-From toilet/BSC: Min A (steadying Pt. > 75%)  FIM - Banker Devices: Walker;Arm rests;Bed rails;HOB elevated Bed/Chair Transfer: 4: Supine >  Sit: Min A (steadying Pt. > 75%/lift 1 leg);3: Bed > Chair or W/C: Mod A (lift or lower assist);4: Chair or W/C > Bed: Min A (steadying Pt. > 75%)  FIM - Locomotion: Wheelchair Distance: 55 Locomotion: Wheelchair: 1: Total Assistance/staff pushes wheelchair (Pt<25%) FIM - Locomotion: Ambulation Locomotion: Ambulation Assistive Devices: Designer, industrial/product Ambulation/Gait Assistance: 4: Min assist;4: Min guard Locomotion: Ambulation: 1: Travels less than 50 ft with minimal assistance (Pt.>75%)  Comprehension Comprehension Mode: Auditory Comprehension: 5-Follows basic conversation/direction: With extra time/assistive device  Expression Expression Mode: Verbal Expression: 5-Expresses complex 90% of the time/cues < 10% of the time  Social Interaction Social Interaction: 6-Interacts appropriately with others with medication or extra time (anti-anxiety, antidepressant).  Problem Solving Problem Solving: 4-Solves basic 75 - 89% of the time/requires cueing 10 - 24% of the time  Memory Memory: 5-Recognizes or recalls 90% of the time/requires cueing < 10% of the time  Medical Problem List and Plan:  1. Right subcapital femur fracture. Status post ORIF 06/18/2013 at [facility]. Partial weightbearing right lower extremity, will check Xrays because of increased pain 2. DVT Prophylaxis/Anticoagulation: Continue Xarelto for deep vein thrombosis right gastrocnemius vein  3. Pain Management: Neurontin 200 mg 3 times daily, oxycodone immediate release 5 mg every 6 hours as needed. Monitor mental status  4. Mood/delirium/confusion. Limit narcotics as much as possible. Recent cranial CT scan negative. Mental status continues to improve  and she appeared fairly functional today. Continue Klonopin 1 mg each bedtime and Cymbalta 60 mg daily. Provide emotional support  5. Neuropsych: This patient is capable of making decisions on her own behalf.  6. Acute blood loss anemia. Followup CBC.Hgb  slightly improved Patient is Jehovah witness---no blood products. Continue iron supplement  7. Hypertension. Clonidine patch 0.1 mg weekly, Vasotec 20 mg daily, Plendil 5 mg daily. Monitor with increased mobility  8. Parkinson's disease versus movement disorder. Sinemet discontinued secondary to possible contributor of confusion  9. Diet controlled diabetes mellitus. Check blood sugars a.c. and at bedtime  10. Pneumonia. All antibiotics since discontinued. Oxygen saturations greater than 90% on room air except at noc. Latest chest x-ray chronic changes no visible effusions or acute abnormality , will repeat    LOS (Days) 5 A FACE TO FACE EVALUATION WAS PERFORMED  Audi Conover E 06/30/2013, 9:49 AM

## 2013-07-01 ENCOUNTER — Inpatient Hospital Stay (HOSPITAL_COMMUNITY): Payer: Medicare Other

## 2013-07-01 ENCOUNTER — Inpatient Hospital Stay (HOSPITAL_COMMUNITY): Payer: Medicare Other | Admitting: Speech Pathology

## 2013-07-01 ENCOUNTER — Inpatient Hospital Stay (HOSPITAL_COMMUNITY): Payer: Medicare Other | Admitting: *Deleted

## 2013-07-01 LAB — GLUCOSE, CAPILLARY
Glucose-Capillary: 156 mg/dL — ABNORMAL HIGH (ref 70–99)
Glucose-Capillary: 185 mg/dL — ABNORMAL HIGH (ref 70–99)
Glucose-Capillary: 171 mg/dL — ABNORMAL HIGH (ref 70–99)

## 2013-07-01 NOTE — Plan of Care (Signed)
Problem: RH BOWEL ELIMINATION Goal: RH STG MANAGE BOWEL W/MEDICATION W/ASSISTANCE STG Manage Bowel with Medication with Assistance. Mod I  Outcome: Progressing Patient had large BM after laxative

## 2013-07-01 NOTE — Progress Notes (Signed)
Occupational Therapy Session Note  Patient Details  Name: Kim Nichols MRN: [id number] Date of Birth: October 30, 1929  Today's Date: 07/01/2013 Time: 1030-1100 and 1415-1500 Time Calculation (min): 30 min and 45 min   Short Term Goals: Week 1:  OT Short Term Goal 1 (Week 1): Pt. will be supI with grooming  OT Short Term Goal 2 (Week 1): Pt. will be supervision with LB dressing OT Short Term Goal 3 (Week 1): Pt. will be supervision with LB bathing OT Short Term Goal 4 (Week 1): Pt. will transfer to toilet with minimal assist OT Short Term Goal 5 (Week 1): Pt. will stand at sink during ADL for 5 min at supervision level  Skilled Therapeutic Interventions/Progress Updates:    Session 1: Pt missing 30 min of skilled OT services secondary to pain and fatigue. Pt provided details of having "rough night" with pain in leg and BMs. Pt reported being extremely fatigued at this time and initially refused OT then agreed to complete dressing task. Pt requested to use bathroom and required min assist for sit<>stand and increased time with ambulation to toilet. Completed toilet task requiring steadying assist for hygiene and clothing management. Pt ambulated back to w/c and required rest break before engaging in dressing task. Pt very shakey this AM and unable to use reacher to assist with LB dressing so OT assisted with threading RLE. Pt stood with min assist to manage around waist. Following dressing pt completed stand pivot transfer with min assist using RW to return to bed. Pt left supine in bed and reported she hoped she was better for afternoon session.   Session 2: Therapy session focused on dynamic standing balance, activity tolerance, functional transfers, and safety awareness. Pt donned pants this afternoon with min assist for steadying with standing balance. Engaged in oral care while standing at sink with close supervision. OT propelled pt to laundry room where she retrieved clothing from dryer with  supervision and placed in bag. Good safety with holding bag and RW management to return to w/c. Pt stood with 2 rest breaks to fold laundry. Returned to room and pt placed clothing over front of RW to place in drawers. Required 2 rest breaks during this activity. Pt completed several sit<>stand transfers ranging from min assist-supervision. Required verbal cues on 1 occasion to lock w/c brakes prior to standing. Pt returned to bed after therapy session and required min assist for sit>supine to manage RLE.   Therapy Documentation Precautions:  Precautions Precautions: Fall Precaution Comments: h/o fall Restrictions Weight Bearing Restrictions: Yes RLE Weight Bearing: Partial weight bearing RLE Partial Weight Bearing Percentage or Pounds: 50 Other Position/Activity Restrictions: 50% General: General Amount of Missed OT Time (min): 30 Minutes Vital Signs:   Pain: Pt reported 6/10 pain at R hip. Session 2: Pt reported 2/10 pain in R hip.  See FIM for current functional status  Therapy/Group: Individual Therapy  [name] 07/01/2013, 11:14 AM

## 2013-07-01 NOTE — Plan of Care (Signed)
Problem: RH Ambulation Goal: LTG Patient will ambulate in community environment (PT) LTG: Patient will ambulate in community environment, # of feet with assistance (PT).  Outcome: Not Applicable Date Met:  07/01/13 discharged 07/01/13 secondary to unnecessary goal

## 2013-07-01 NOTE — Progress Notes (Signed)
Speech Language Pathology Daily Session Note  Patient Details  Name: Unity Luepke MRN: [id number] Date of Birth: 24-Nov-1929  Today's Date: 07/01/2013 Time: 1005-1035 Time Calculation (min): 30 min  Short Term Goals: Week 1: SLP Short Term Goal 1 (Week 1): Pt will demonstrate functional problem solving for mildly complex tasks with supervision verbal cues.  SLP Short Term Goal 1 - Progress (Week 1): Progressing toward goal SLP Short Term Goal 2 (Week 1): Pt will utilize external memory aids to recall new, daily information with supervision question cues.  SLP Short Term Goal 2 - Progress (Week 1): Progressing toward goal SLP Short Term Goal 3 (Week 1): Pt will identify 2 cognitive impairments with supervision question and semantic cues.  SLP Short Term Goal 3 - Progress (Week 1): Progressing toward goal SLP Short Term Goal 4 (Week 1): Pt will utilize call bell to express wants/needs with supervision question cues.  SLP Short Term Goal 4 - Progress (Week 1): Met  Skilled Therapeutic Interventions: Skilled treatment session focused on addressing cognition goals.  SLP facilitated session with discussion regarding awareness of current deficits, ways to compensate for them at discharge and things that she will need assistance with; Patient required overall Supervision question cues.  Patient also required Supervision question cues to utilize external aids to assist with recall of daily information.     FIM:  Comprehension Comprehension: 6-Follows complex conversation/direction: With extra time/assistive device Expression Expression Mode: Verbal Expression: 6-Expresses complex ideas: With extra time/assistive device Social Interaction Social Interaction: 6-Interacts appropriately with others with medication or extra time (anti-anxiety, antidepressant). Problem Solving Problem Solving: 5-Solves basic problems: With no assist Memory Memory: 5-Recognizes or recalls 90% of the time/requires  cueing < 10% of the time  Pain Pain Assessment Pain Assessment: 0-10 Pain Score: 5  Pain Type: Surgical pain Pain Location: Hip Pain Orientation: Right Pain Descriptors / Indicators: Aching Pain Onset: On-going Patients Stated Pain Goal: 3 Pain Intervention(s): RN made aware Multiple Pain Sites: No  Therapy/Group: Individual Therapy  Charlane Ferretti., CCC-SLP [phone]  Braylynn Ghan 07/01/2013, 10:56 AM

## 2013-07-01 NOTE — Plan of Care (Signed)
Problem: RH BOWEL ELIMINATION Goal: RH STG MANAGE BOWEL W/MEDICATION W/ASSISTANCE STG Manage Bowel with Medication with Assistance. Mod I  Outcome: Not Progressing LBM 06/27/2013. Sorbitol given. Awaiting for results.

## 2013-07-01 NOTE — Progress Notes (Signed)
Physical Therapy Weekly Progress Note  Patient Details  Name: Kim Nichols MRN: [id number] Date of Birth: Mar 24, 1929  Today's Date: 07/01/2013 Time: 7829-5621 Time Calculation (min): 57 min  Patient has met 0 of 8 long term goals.  Short term goals not set due to estimated length of stay and patient currently functioning at a min assist level overall and LTGs set for supervision. Patient has made good progress in rehab this week. Patient has made improvements in all functional mobility, but continues to require modA for initial sit>stand transfer from bed in the AM. It is anticipated that patient will meet 8/8 of her LTGs upon discharge.  Patient continues to demonstrate the following deficits: decreased strength, decreased activity tolerance, decreased balance, decreased coordination, and gait impairments, and therefore will continue to benefit from skilled PT intervention to enhance overall performance with activity tolerance, balance, ability to compensate for deficits, coordination and knowledge of precautions.  See Patient's Care Plan for progression toward long term goals.  Patient progressing toward long term goals..  Continue plan of care.  Skilled Therapeutic Interventions/Progress Updates:    Patient received supine in bed, c/o "a lot of pain". RN notified secondary to patient had not yet received pain medication. RN present to provide medication later in session. Patient's abilities limited during session today secondary to high pain levels. Patient performed supine LE strengthening exercises on mat secondary to pain. Patient performed B quad sets with 3" hold x20, B hip abduction (with pillow case and slide board to facilitate ease of movement) x20, ankle pumps x25. Patient returned to room and left seated in wheelchair with R LE elevating leg rest and pillows for positioning to increase comfort, all needs within reach.  Therapy Documentation Precautions:  Precautions Precautions:  Fall Precaution Comments: h/o fall Restrictions Weight Bearing Restrictions: Yes RLE Weight Bearing: Partial weight bearing RLE Partial Weight Bearing Percentage or Pounds: 50 Other Position/Activity Restrictions: 50% Pain: Pain Assessment Pain Assessment: 0-10 Pain Score: 8  Pain Type: Surgical pain Pain Location: Hip Pain Orientation: Right Pain Descriptors / Indicators: Aching;Sharp Pain Onset: On-going Pain Intervention(s): RN made aware;Ambulation/increased activity;Repositioned Multiple Pain Sites: No Locomotion : Ambulation Ambulation: Yes Ambulation/Gait Assistance: 4: Min guard;5: Supervision Ambulation Distance (Feet): 30 Feet Assistive device: Rolling walker Ambulation/Gait Assistance Details: Verbal cues for technique;Verbal cues for precautions/safety;Tactile cues for posture Ambulation/Gait Assistance Details: Gait training 30' x1 in controlled environment. Unable to continue secondary to pain level. Gait Gait: Yes Gait Pattern: Step-to pattern;Decreased step length - left;Decreased stride length;Antalgic;Trunk flexed   See FIM for current functional status  Therapy/Group: Individual Therapy  Chipper Herb. Acsa Estey, PT, DPT 07/01/2013, 9:29 AM

## 2013-07-02 ENCOUNTER — Inpatient Hospital Stay (HOSPITAL_COMMUNITY): Payer: Medicare Other | Admitting: Physical Therapy

## 2013-07-02 ENCOUNTER — Inpatient Hospital Stay (HOSPITAL_COMMUNITY): Payer: Medicare Other

## 2013-07-02 ENCOUNTER — Inpatient Hospital Stay (HOSPITAL_COMMUNITY): Payer: Medicare Other | Admitting: Speech Pathology

## 2013-07-02 LAB — GLUCOSE, CAPILLARY
Glucose-Capillary: 214 mg/dL — ABNORMAL HIGH (ref 70–99)
Glucose-Capillary: 127 mg/dL — ABNORMAL HIGH (ref 70–99)
Glucose-Capillary: 170 mg/dL — ABNORMAL HIGH (ref 70–99)

## 2013-07-02 NOTE — Progress Notes (Signed)
Occupational Therapy Weekly Progress Note  Patient Details  Name: Kim Nichols MRN: [id number] Date of Birth: Jul 09, 1929  Today's Date: 07/02/2013 Time:  0730- 0825 and 1330-1400 Time Calculation (min): 55 min and 30 min     Patient has met 3 of 5 short term goals.  Patient progressing towards LB self-care goals of supervision and has been inconsistent with meeting these goals as she requires steadying assist at times secondary to pain level. Patient following weight bearing precautions without cues however requires occasional cues for safety with locking w/c brakes before standing. Patient is demonstrating good carryover with AE to assist with LB self-care tasks. Patient demonstrates improved carryover of energy conservation techniques as she requires min verbal cues for rest breaks during therapy session.   Patient continues to demonstrate the following deficits: decreased balance, decreased activity tolerance, decreased strength, decreased postural control in standing, decreased safety awareness, and therefore will continue to benefit from skilled OT intervention to enhance overall performance with BADL.  Patient progressing toward long term goals..  Continue plan of care.  OT Short Term Goals Week 1:  OT Short Term Goal 1 (Week 1): Pt. will be supI with grooming  OT Short Term Goal 1 - Progress (Week 1): Met OT Short Term Goal 2 (Week 1): Pt. will be supervision with LB dressing OT Short Term Goal 2 - Progress (Week 1): Progressing toward goal OT Short Term Goal 3 (Week 1): Pt. will be supervision with LB bathing OT Short Term Goal 3 - Progress (Week 1): Progressing toward goal OT Short Term Goal 4 (Week 1): Pt. will transfer to toilet with minimal assist OT Short Term Goal 4 - Progress (Week 1): Met OT Short Term Goal 5 (Week 1): Pt. will stand at sink during ADL for 5 min at supervision level OT Short Term Goal 5 - Progress (Week 1): Met Week 2:  OT Short Term Goal 1 (Week 2):  STGs=LTGs  Skilled Therapeutic Interventions/Progress Updates:    Session 1: Therapy session on ADL retraining focused functional transfers, carryover with AE, and activity tolerance. Pt reported having "much better night" and felt better for therapy this AM. Pt ambulates from bed to walk-in shower with supervision-steadying assist and verbal cues for upright posture. Completed bathing task at supervision for first time on this date then required steadying assist to transfer out of shower d/t fatigue. Pt demonstrates good carryover with AE for LB dressing and required steadying assist for standing balance as she managed clothing around waist. Completed oral care while standing at sink at supervision level. Pt required 4 rest breaks on this date however required cue to take only one. Pt reported at home she tries to do things too fast and is aware she needs to take more breaks. Educated her on safety and importance of rest breaks.   Session 2: Therapy session focused on standing balance, activity tolerance, sit<>stand transfers, and safety. Pt completed oral care and washing hands while standing at sink. Close supervision for sit<>stand with pt following weight bearing precautions. Engaged in standing activity with 3 rest breaks and supervision. Pt required min assist for last sit<>stand secondary to fatigue. Pt became shakey during activity and cued to sit for rest break. Pt reported it was nerves and went on to discuss stress of one daughter who has many medical issues. Pt reported she always worries about her. Provided further education then pt began talking about other daughter who was more medically healthy and will be caring for her for  2 weeks after discharge. Pt's shakiness went away while pt was talking about this daughter. At end of session pt completed stand pivot transfer w/c to recliner with steadying assist.   Therapy Documentation Precautions:  Precautions Precautions: Fall Precaution  Comments: h/o fall Restrictions Weight Bearing Restrictions: Yes RLE Weight Bearing: Partial weight bearing RLE Partial Weight Bearing Percentage or Pounds: 50 Other Position/Activity Restrictions: 50% General:   Vital Signs: Therapy Vitals Temp: 97.3 F (36.3 C) Temp src: Oral Pulse Rate: 67 Resp: 17 BP: 100/60 mmHg Patient Position, if appropriate: Sitting Oxygen Therapy SpO2: 90 % O2 Device: None (Room air) Pain: Pt reported pain of 3-4 in R hip during therapy sessions.   See FIM for current functional status  Therapy/Group: Individual Therapy  Kim Nichols 07/02/2013, 7:23 AM

## 2013-07-02 NOTE — Progress Notes (Signed)
Speech Language Pathology Daily Session Note  Patient Details  Name: Kim Nichols MRN: [id number] Date of Birth: 01/16/1929  Today's Date: 07/02/2013 Time: 1445-1530 Time Calculation (min): 45 min  Short Term Goals: Week 1: SLP Short Term Goal 1 (Week 1): Pt will demonstrate functional problem solving for mildly complex tasks with supervision verbal cues.  SLP Short Term Goal 1 - Progress (Week 1): Progressing toward goal SLP Short Term Goal 2 (Week 1): Pt will utilize external memory aids to recall new, daily information with supervision question cues.  SLP Short Term Goal 2 - Progress (Week 1): Progressing toward goal SLP Short Term Goal 3 (Week 1): Pt will identify 2 cognitive impairments with supervision question and semantic cues.  SLP Short Term Goal 3 - Progress (Week 1): Progressing toward goal SLP Short Term Goal 4 (Week 1): Pt will utilize call bell to express wants/needs with supervision question cues.  SLP Short Term Goal 4 - Progress (Week 1): Met  Skilled Therapeutic Interventions: Skilled treatment session focused on addressing cognition goals.  SLP facilitated session with discussion regarding current medications: names and functions; patient required Min assist cues to recall information.  SLP provided with an external aid that assisted with recall of name, function and frequency.  Patient required Mod faded to Min assist cues to appropriately identify where different medication would go.  Recommend practicing loading a medication box during next session to reinforce task prior to discharge home with daughter.   SLP also facilitated session with Min physical assist to ambulate with rolling walker to and from bathroom.     FIM:  Comprehension Comprehension: 6-Follows complex conversation/direction: With extra time/assistive device Expression Expression Mode: Verbal Expression: 6-Expresses complex ideas: With extra time/assistive device Social Interaction Social Interaction:  6-Interacts appropriately with others with medication or extra time (anti-anxiety, antidepressant). Problem Solving Problem Solving: 5-Solves basic problems: With no assist Memory Memory: 4-Recognizes or recalls 75 - 89% of the time/requires cueing 10 - 24% of the time  Pain Pain Assessment Pain Assessment: 0-10 Pain Score: 6  Pain Type: Surgical pain Pain Location: Hip Pain Orientation: Right Pain Descriptors / Indicators: Aching Pain Frequency: Intermittent Pain Onset: Gradual Patients Stated Pain Goal: 3 Pain Intervention(s): Medication (See eMAR);Repositioned Multiple Pain Sites: No  Therapy/Group: Individual Therapy  Charlane Ferretti., CCC-SLP [phone]  [name] 07/02/2013, 4:18 PM

## 2013-07-02 NOTE — Progress Notes (Signed)
Patient ID: Kim Nichols, female   DOB: 1929-10-21, 79 y.o.   MRN: [id number] Subjective/Complaints: No issues overnite reported, pain with activity but participating well No cough or SOB Review of Systems  Constitutional: Negative for fever and chills.  Respiratory: Negative for cough.   Musculoskeletal: Positive for joint pain.  Neurological: Positive for focal weakness.  All other systems reviewed and are negative.    Objective: Vital Signs: Blood pressure 100/60, pulse 67, temperature 97.3 F (36.3 C), temperature source Oral, resp. rate 17, height 5\' 4"  (1.626 m), weight 64.4 kg (141 lb 15.6 oz), SpO2 90.00%. No results found. Results for orders placed during the hospital encounter of 06/25/13 (from the past 72 hour(s))  GLUCOSE, CAPILLARY     Status: Abnormal   Collection Time    06/29/13 11:23 AM      Result Value Range   Glucose-Capillary 220 (*) 70 - 99 mg/dL   Comment 1 Notify RN    GLUCOSE, CAPILLARY     Status: Abnormal   Collection Time    06/29/13  4:23 PM      Result Value Range   Glucose-Capillary 146 (*) 70 - 99 mg/dL   Comment 1 Notify RN    GLUCOSE, CAPILLARY     Status: Abnormal   Collection Time    06/29/13  7:58 PM      Result Value Range   Glucose-Capillary 162 (*) 70 - 99 mg/dL   Comment 1 Notify RN    GLUCOSE, CAPILLARY     Status: Abnormal   Collection Time    06/30/13  7:43 AM      Result Value Range   Glucose-Capillary 158 (*) 70 - 99 mg/dL   Comment 1 Notify RN    GLUCOSE, CAPILLARY     Status: Abnormal   Collection Time    06/30/13 12:16 PM      Result Value Range   Glucose-Capillary 166 (*) 70 - 99 mg/dL   Comment 1 Notify RN    GLUCOSE, CAPILLARY     Status: Abnormal   Collection Time    06/30/13  5:01 PM      Result Value Range   Glucose-Capillary 156 (*) 70 - 99 mg/dL   Comment 1 Notify RN    GLUCOSE, CAPILLARY     Status: Abnormal   Collection Time    06/30/13  8:49 PM      Result Value Range   Glucose-Capillary 148 (*) 70 -  99 mg/dL   Comment 1 Notify RN    GLUCOSE, CAPILLARY     Status: Abnormal   Collection Time    07/01/13  7:23 AM      Result Value Range   Glucose-Capillary 156 (*) 70 - 99 mg/dL   Comment 1 Notify RN    GLUCOSE, CAPILLARY     Status: Abnormal   Collection Time    07/01/13 11:45 AM      Result Value Range   Glucose-Capillary 185 (*) 70 - 99 mg/dL   Comment 1 Notify RN    GLUCOSE, CAPILLARY     Status: Abnormal   Collection Time    07/01/13  5:00 PM      Result Value Range   Glucose-Capillary 145 (*) 70 - 99 mg/dL   Comment 1 Notify RN    GLUCOSE, CAPILLARY     Status: Abnormal   Collection Time    07/01/13  8:30 PM      Result Value Range   Glucose-Capillary 171 (*)  70 - 99 mg/dL  GLUCOSE, CAPILLARY     Status: Abnormal   Collection Time    07/02/13  7:23 AM      Result Value Range   Glucose-Capillary 125 (*) 70 - 99 mg/dL   Comment 1 Notify RN       HEENT: Poor dentition Cardio: RRR and No murmurs Resp: CTA B/L and Crackles at both bases GI: BS positive and Non-distended Extremity:  Pulses positive and No Edema Skin:   Wound C/D/I and Right hip with staples lateral aspect, no drainage, no ecchymosis Neuro: Alert/Oriented, Cranial Nerve II-XII normal, Normal Sensory, Abnormal Motor 5/5 bilateral deltoid, bicep, tricep, grip, 2 minus right hip flexor 3 minus right knee extensor for bilateral ankle dorsiflexor plantar flexor and left proximal and Tone:  Within Normal Limits Musc/Skel:  Swelling Right thigh and right calf General no acute distress Hip pain with reduced passive range of motion  Assessment/Plan: 1. Functional deficits secondary to right subcapital hip fracture with post operative pneumonia and DVT which require 3+ hours per day of interdisciplinary therapy in a comprehensive inpatient rehab setting. Physiatrist is providing close team supervision and 24 hour management of active medical problems listed below. Physiatrist and rehab team continue to assess  barriers to discharge/monitor patient progress toward functional and medical goals. FIM: FIM - Bathing Bathing Steps Patient Completed: Chest;Right Arm;Left Arm;Front perineal area;Right upper leg;Abdomen;Left upper leg;Buttocks;Right lower leg (including foot);Left lower leg (including foot) Bathing: 4: Steadying assist  FIM - Upper Body Dressing/Undressing Upper body dressing/undressing steps patient completed: Thread/unthread right sleeve of pullover shirt/dresss;Thread/unthread left sleeve of pullover shirt/dress;Put head through opening of pull over shirt/dress;Pull shirt over trunk Upper body dressing/undressing: 5: Set-up assist to: Obtain clothing/put away FIM - Lower Body Dressing/Undressing Lower body dressing/undressing steps patient completed: Thread/unthread left underwear leg;Pull underwear up/down Lower body dressing/undressing: 3: Mod-Patient completed 50-74% of tasks  FIM - Toileting Toileting steps completed by patient: Adjust clothing prior to toileting;Performs perineal hygiene;Adjust clothing after toileting Toileting Assistive Devices: Grab bar or rail for support Toileting: 4: Steadying assist  FIM - Diplomatic Services operational officer Devices: Grab bars;Walker;Elevated toilet seat Toilet Transfers: 4-To toilet/BSC: Min A (steadying Pt. > 75%);4-From toilet/BSC: Min A (steadying Pt. > 75%)  FIM - Banker Devices: Walker;Arm rests;HOB elevated Bed/Chair Transfer: 4: Sit > Supine: Min A (steadying pt. > 75%/lift 1 leg);4: Chair or W/C > Bed: Min A (steadying Pt. > 75%)  FIM - Locomotion: Wheelchair Distance: 55 Locomotion: Wheelchair: 1: Total Assistance/staff pushes wheelchair (Pt<25%) FIM - Locomotion: Ambulation Locomotion: Ambulation Assistive Devices: Designer, industrial/product Ambulation/Gait Assistance: 4: Min guard;5: Supervision Locomotion: Ambulation: 1: Travels less than 50 ft with minimal assistance  (Pt.>75%)  Comprehension Comprehension Mode: Auditory Comprehension: 6-Follows complex conversation/direction: With extra time/assistive device  Expression Expression Mode: Verbal Expression: 6-Expresses complex ideas: With extra time/assistive device  Social Interaction Social Interaction: 6-Interacts appropriately with others with medication or extra time (anti-anxiety, antidepressant).  Problem Solving Problem Solving: 5-Solves basic problems: With no assist  Memory Memory: 5-Recognizes or recalls 90% of the time/requires cueing < 10% of the time  Medical Problem List and Plan:  1. Right subcapital femur fracture. Status post ORIF 06/18/2013 at [facility]. Partial weightbearing right lower extremity, will check Xrays because of increased pain 2. DVT Prophylaxis/Anticoagulation: Continue Xarelto for deep vein thrombosis right gastrocnemius vein  3. Pain Management: Neurontin 200 mg 3 times daily, oxycodone immediate release 5 mg every 6 hours as needed. Monitor  mental status  4. Mood/delirium/confusion. Limit narcotics as much as possible. Recent cranial CT scan negative. Mental status continues to improve and she appeared fairly functional today. Continue Klonopin 1 mg each bedtime and Cymbalta 60 mg daily. Provide emotional support  5. Neuropsych: This patient is capable of making decisions on her own behalf.  6. Acute blood loss anemia. Followup CBC.Hgb slightly improved Patient is Jehovah witness---no blood products. Continue iron supplement  7. Hypertension. Clonidine patch 0.1 mg weekly, Vasotec 20 mg daily, Plendil 5 mg daily. Monitor with increased mobility  8. Parkinson's disease versus movement disorder. Sinemet discontinued secondary to possible contributor of confusion  9. Diet controlled diabetes mellitus. Check blood sugars a.c. and at bedtime  10. Pneumonia. All antibiotics since discontinued. Oxygen saturations greater than 90% on room air except at  noc. Latest chest x-ray chronic changes no visible effusions or acute abnormality , will repeat    LOS (Days) 7 A FACE TO FACE EVALUATION WAS PERFORMED  KIRSTEINS,ANDREW E 07/02/2013, 11:22 AM

## 2013-07-02 NOTE — Progress Notes (Signed)
Physical Therapy Note  Patient Details  Name: Kim Nichols MRN: [id number] Date of Birth: [birth year] Today's Date: 07/02/2013  4098-1191 (55 minutes) individual Pain: no initial c/o pain / reports 4/10 RT hip post exercises/ premedicated Focus of treatment: gait training PWB RT LE; wc mobility training/endurance; bilateral LE AROM/strengthening Treatment: Pt up in wc upon arrival; wc mobility- 100 feet SBA on level surfaces; gait 31 feet RW SBA with standing rest break; 37 feet RW SBA before c/o of fatigue; transfers stand/turn RW SBA; sit to supine (mat) SBA with difficulty RT LE; supine to sit (mat) SBA; bilateral LE AROM/strengthening X 20 in supine with initial c/o of RT hip flexor tightness- ankle pumps, heel slides (using sheet on right to self assist), hip abduction (AA on right) , quad sets, SAQs.    Chizara Mena,JIM 07/02/2013, 9:46 AM

## 2013-07-03 ENCOUNTER — Inpatient Hospital Stay (HOSPITAL_COMMUNITY): Payer: Medicare Other | Admitting: Physical Therapy

## 2013-07-03 DIAGNOSIS — I10 Essential (primary) hypertension: Secondary | ICD-10-CM

## 2013-07-03 DIAGNOSIS — S8290XS Unspecified fracture of unspecified lower leg, sequela: Secondary | ICD-10-CM

## 2013-07-03 DIAGNOSIS — D649 Anemia, unspecified: Secondary | ICD-10-CM

## 2013-07-03 LAB — GLUCOSE, CAPILLARY: Glucose-Capillary: 171 mg/dL — ABNORMAL HIGH (ref 70–99)

## 2013-07-03 NOTE — Progress Notes (Signed)
Physical Therapy Session Note  Patient Details  Name: Rayola Everhart MRN: [id number] Date of Birth: 01/28/1929  Today's Date: 07/03/2013 Time: 1100-1200 Time Calculation (min): 60 min  Short Term Goals: Week 1:  PT Short Term Goal 1 (Week 1): STGs=LTGs   Therapy Documentation Precautions:  Precautions Precautions: Fall Precaution Comments: h/o fall Restrictions Weight Bearing Restrictions: Yes RLE Weight Bearing: Partial weight bearing RLE Partial Weight Bearing Percentage or Pounds: 50 Other Position/Activity Restrictions: 50% Pain: Pain Assessment Pain Assessment: 0-10 Pain Score:2 to 4 Pain Type: Surgical pain Pain Location: Hip Pain Orientation: Right Pain Descriptors / Indicators: Aching Pain Frequency: Intermittent Pain Onset: Gradual Patients Stated Pain Goal: 2 Pain Intervention(s): Medication (See eMAR)   Walking Group: gait using RW 5 x 80' while maintaining PWB R LE.and seated rest breaks in between walking bouts.  Therapy/Group: Group Therapy  Cyera Balboni J 07/03/2013, 12:46 PM

## 2013-07-03 NOTE — Progress Notes (Signed)
Kim Nichols is a 79 y.o. female [birth year] [id number]  Subjective: No new complaints. No new problems. Slept well. Feeling OK.  Objective: Vital signs in last 24 hours: Temp:  [97.6 F (36.4 C)-98 F (36.7 C)] 98 F (36.7 C) (07/05 0550) Pulse Rate:  [67-71] 67 (07/05 0550) Resp:  [18] 18 (07/05 0550) BP: (103-122)/(64-68) 117/64 mmHg (07/05 0550) SpO2:  [96 %-97 %] 96 % (07/05 0550) Weight change:  Last BM Date: 07/02/13  Intake/Output from previous day: 07/04 0701 - 07/05 0700 In: 1200 [P.O.:1200] Out: -  Last cbgs: CBG (last 3)   Recent Labs  07/02/13 1622 07/02/13 2049 07/03/13 0742  GLUCAP 127* 170* 128*     Physical Exam General: No apparent distress    HEENT: moist mucosa Lungs: Normal effort. Lungs clear to auscultation, no crackles or wheezes. Cardiovascular: Regular rate and rhythm, no edema Musculoskeletal:  No change from before Neurological: No new neurological deficits Wounds: N/A    Skin: clear Alert, cooperative   Lab Results: BMET    Component Value Date/Time   NA 137 06/28/2013 0550   K 4.4 06/28/2013 0550   CL 99 06/28/2013 0550   CO2 31 06/28/2013 0550   GLUCOSE 140* 06/28/2013 0550   BUN 13 06/28/2013 0550   CREATININE 0.84 06/28/2013 0550   CALCIUM 8.8 06/28/2013 0550   GFRNONAA 63* 06/28/2013 0550   GFRAA 73* 06/28/2013 0550   CBC    Component Value Date/Time   WBC 9.5 06/28/2013 0550   RBC 2.78* 06/28/2013 0550   HGB 8.4* 06/28/2013 0550   HCT 25.5* 06/28/2013 0550   PLT 406* 06/28/2013 0550   MCV 91.7 06/28/2013 0550   MCH 30.2 06/28/2013 0550   MCHC 32.9 06/28/2013 0550   RDW 14.3 06/28/2013 0550   LYMPHSABS 1.5 06/28/2013 0550   MONOABS 1.2* 06/28/2013 0550   EOSABS 0.0 06/28/2013 0550   BASOSABS 0.0 06/28/2013 0550    Studies/Results: No results found.  Medications: I have reviewed the patient's current medications.  Assessment/Plan:   1. Right subcapital femur fracture. Status post ORIF 06/18/2013 at [facility]. Partial weightbearing right lower extremity  2. DVT Prophylaxis/Anticoagulation: Continue Xarelto for deep vein thrombosis right gastrocnemius vein  3. Pain Management: Neurontin 200 mg 3 times daily, oxycodone immediate release 5 mg every 6 hours as needed. Monitor mental status  4. Mood/delirium/confusion. Limit narcotics as much as possible. Recent cranial CT scan negative. Mental status continues to improve and she appeared fairly functional today. Continue Klonopin 1 mg each bedtime and Cymbalta 60 mg daily. Provide emotional support  5. Neuropsych: This patient is capable of making decisions on her own behalf.  6. Acute blood loss anemia. Followup CBC. Patient is Jehovah witness---no blood products. Continue iron supplement  7. Hypertension. Clonidine patch 0.1 mg weekly, Vasotec 20 mg daily, Plendil 5 mg daily. Monitor with increased mobility  8. Parkinson's disease versus movement disorder. Sinemet discontinued secondary to possible contributor of confusion  9. Diet controlled diabetes mellitus. Check blood sugars a.c. and at bedtime  10. Pneumonia. All antibiotics since discontinued. Oxygen saturations greater than 90% on room air. Latest chest x-ray chronic changes no visible effusions or acute abnormality     Length of stay, days: 8  Sonda Primes , MD 07/03/2013, 9:23 AM

## 2013-07-04 ENCOUNTER — Inpatient Hospital Stay (HOSPITAL_COMMUNITY): Payer: Medicare Other | Admitting: *Deleted

## 2013-07-04 LAB — GLUCOSE, CAPILLARY: Glucose-Capillary: 148 mg/dL — ABNORMAL HIGH (ref 70–99)

## 2013-07-04 NOTE — Progress Notes (Signed)
Physical Therapy Session Note  Patient Details  Name: Kim Nichols MRN: [id number] Date of Birth: [birth year]  Today's Date: 07/04/2013 Time: 3086-5784 Time Calculation (min): 60 min  Patient supine in bed at the beginning of the session, supine to sit with supervision, no c/o of pain. Transfer to w/c with supervision. Gait Traiing on a distance of 104 feet with RW maintaining PWB,cues for posture.Multiple transfers,sit to stand from different heights of mat with mod A to contact guard depending on the height of the surface. Stairs 2 x5 with Supervision and cues for proper sequencing. Exercises to increase muscular strength in R LE with 4 lbs ,exercises to increase ROM in R knee. Therapy Documentation Precautions:  Precautions Precautions: Fall Precaution Comments: h/o fall Restrictions Weight Bearing Restrictions: Yes RLE Weight Bearing: Partial weight bearing RLE Partial Weight Bearing Percentage or Pounds: 50 Other Position/Activity Restrictions: 50% Pain: Pain Assessment Pain Assessment: 0-10 Pain Score: 6  Pain Type: Acute pain Pain Location: Leg Pain Orientation: Right Pain Descriptors / Indicators: Sore Pain Onset: Gradual Pain Intervention(s): Medication (See eMAR)  See FIM for current functional status  Therapy/Group: Individual Therapy  Dorna Mai 07/04/2013, 2:55 PM

## 2013-07-04 NOTE — Progress Notes (Signed)
  Subjective: No new complaints. No new problems. Slept well. Feeling OK.  Objective: Vital signs in last 24 hours: Temp:  [97.7 F (36.5 C)-97.9 F (36.6 C)] 97.9 F (36.6 C) (07/06 0640) Pulse Rate:  [63-68] 63 (07/06 0640) Resp:  [18] 18 (07/06 0640) BP: (101-127)/(50-67) 122/58 mmHg (07/06 0754) SpO2:  [96 %] 96 % (07/06 0640) Weight change:  Last BM Date: 07/03/13  Intake/Output from previous day: 07/05 0701 - 07/06 0700 In: 1760 [P.O.:1760] Out: -  Last cbgs: CBG (last 3)   Recent Labs  07/03/13 1641 07/03/13 2032 07/04/13 0745  GLUCAP 137* 154* 122*     Physical Exam General: No apparent distress    HEENT: moist mucosa Lungs: Normal effort. Lungs clear to auscultation, no crackles or wheezes. Cardiovascular: Regular rate and rhythm, no edema Musculoskeletal:  No change from before Neurological: No new neurological deficits Wounds: N/A    Skin: clear Alert, cooperative   Lab Results: BMET    Component Value Date/Time   NA 137 06/28/2013 0550   K 4.4 06/28/2013 0550   CL 99 06/28/2013 0550   CO2 31 06/28/2013 0550   GLUCOSE 140* 06/28/2013 0550   BUN 13 06/28/2013 0550   CREATININE 0.84 06/28/2013 0550   CALCIUM 8.8 06/28/2013 0550   GFRNONAA 63* 06/28/2013 0550   GFRAA 73* 06/28/2013 0550   CBC    Component Value Date/Time   WBC 9.5 06/28/2013 0550   RBC 2.78* 06/28/2013 0550   HGB 8.4* 06/28/2013 0550   HCT 25.5* 06/28/2013 0550   PLT 406* 06/28/2013 0550   MCV 91.7 06/28/2013 0550   MCH 30.2 06/28/2013 0550   MCHC 32.9 06/28/2013 0550   RDW 14.3 06/28/2013 0550   LYMPHSABS 1.5 06/28/2013 0550   MONOABS 1.2* 06/28/2013 0550   EOSABS 0.0 06/28/2013 0550   BASOSABS 0.0 06/28/2013 0550    Studies/Results: No results found.  Medications: I have reviewed the patient's current medications.  Assessment/Plan:   1. Right subcapital femur fracture. Status post ORIF 06/18/2013 at [facility]. Partial weightbearing right lower extremity  2.  DVT Prophylaxis/Anticoagulation: Continue Xarelto for deep vein thrombosis right gastrocnemius vein  3. Pain Management: Neurontin 200 mg 3 times daily, oxycodone immediate release 5 mg every 6 hours as needed. Monitor mental status  4. Mood/delirium/confusion. Limit narcotics as much as possible. Recent cranial CT scan negative. Mental status continues to improve and she appeared fairly functional today. Continue Klonopin 1 mg each bedtime and Cymbalta 60 mg daily. Provide emotional support  5. Neuropsych: This patient is capable of making decisions on her own behalf.  6. Acute blood loss anemia. Followup CBC. Patient is Jehovah witness---no blood products. Continue iron supplement  7. Hypertension. Clonidine patch 0.1 mg weekly, Vasotec 20 mg daily, Plendil 5 mg daily. Monitor with increased mobility  8. Parkinson's disease versus movement disorder. Sinemet discontinued secondary to possible contributor of confusion  9. Diet controlled diabetes mellitus. Check blood sugars a.c. and at bedtime  10. Pneumonia. All antibiotics since discontinued. Oxygen saturations greater than 90% on room air. Latest chest x-ray chronic changes no visible effusions or acute abnormality   Cont Rx  Length of stay, days: 9  Sonda Primes , MD 07/04/2013, 8:25 AM

## 2013-07-05 ENCOUNTER — Inpatient Hospital Stay (HOSPITAL_COMMUNITY): Payer: Medicare Other | Admitting: Speech Pathology

## 2013-07-05 ENCOUNTER — Inpatient Hospital Stay (HOSPITAL_COMMUNITY): Payer: Medicare Other

## 2013-07-05 ENCOUNTER — Inpatient Hospital Stay (HOSPITAL_COMMUNITY): Payer: Medicare Other | Admitting: Physical Therapy

## 2013-07-05 DIAGNOSIS — G92 Toxic encephalopathy: Secondary | ICD-10-CM

## 2013-07-05 DIAGNOSIS — D62 Acute posthemorrhagic anemia: Secondary | ICD-10-CM

## 2013-07-05 DIAGNOSIS — I10 Essential (primary) hypertension: Secondary | ICD-10-CM

## 2013-07-05 DIAGNOSIS — S72009A Fracture of unspecified part of neck of unspecified femur, initial encounter for closed fracture: Secondary | ICD-10-CM

## 2013-07-05 LAB — GLUCOSE, CAPILLARY
Glucose-Capillary: 150 mg/dL — ABNORMAL HIGH (ref 70–99)
Glucose-Capillary: 168 mg/dL — ABNORMAL HIGH (ref 70–99)
Glucose-Capillary: 147 mg/dL — ABNORMAL HIGH (ref 70–99)
Glucose-Capillary: 147 mg/dL — ABNORMAL HIGH (ref 70–99)

## 2013-07-05 NOTE — Progress Notes (Signed)
Physical Therapy Session Note  Patient Details  Name: Kim Nichols MRN: [id number] Date of Birth: 09-Jun-1929  Today's Date: 07/05/2013 Time: 1003-1059 Time Calculation (min): 56 min  Short Term Goals: Week 2:  PT Short Term Goal 1 (Week 2): STGs=LTGs  Skilled Therapeutic Interventions/Progress Updates:    This session focused on transfers out of bed with supervision [facility] elevates without railing.  Pt was able to progress her right leg over the side of bed without external assistance.  Stand pivot transfers with RW supervision, slow to move and transition with cues for hand placement.  Gait with RW 100' with supervision.  Verbal cues for upright posture.  Stairs with one handrail and one hand held assist 3, 4" steps and 2, 5" steps up and down with min hand held assist.  Pt was able to verbalize and demonstrate the correct LE sequence.  One curb step (4") (simulating getting in the house via the garage) Min assist with RW.  Verbal cues for technique (when to move RW up onto the step). Pt has much more difficulty maintaining PWB with step up with RW than with step back down with RW.  Seated leg TE: heel and toe raises, LAQs, hip adduction against pillow, seated hip flexion x 10 each bil.  Pt positioned back in recliner chair in room with bil feet elevated for edema management at end of session.    Therapy Documentation Precautions:  Precautions Precautions: Fall Precaution Comments: h/o fall Restrictions Weight Bearing Restrictions: Yes RLE Weight Bearing: Partial weight bearing RLE Partial Weight Bearing Percentage or Pounds: 50 Other Position/Activity Restrictions: 50% Vital Signs: Therapy Vitals Pulse Rate: 70 Oxygen Therapy SpO2: 97 % O2 Device: None (Room air) Pain: Pain Assessment Pain Assessment: 0-10 Pain Score: 4  Pain Type: Surgical pain Pain Location: Hip Pain Orientation: Right Pain Intervention(s): Repositioned;Ambulation/increased activity Locomotion  : Ambulation Ambulation/Gait Assistance: 5: Supervision Wheelchair Mobility Distance: 150   See FIM for current functional status  Therapy/Group: Individual Therapy  Lurena Joiner B. Simya Tercero, PT, DPT [phone]   07/05/2013, 12:16 PM

## 2013-07-05 NOTE — Plan of Care (Signed)
Problem: RH KNOWLEDGE DEFICIT Goal: RH STG INCREASE KNOWLEDGE OF DIABETES Patient will be able to verbalize understanding of normal blood sugar, complaint with medications and diet regimen at prior to discharge.  Outcome: Progressing Diet controlled

## 2013-07-05 NOTE — Progress Notes (Signed)
Occupational Therapy Session Note  Patient Details  Name: Kim Nichols MRN: [id number] Date of Birth: 10/10/1929  Today's Date: 07/05/2013 Time: 1308-6578 and 1330-1405 Time Calculation (min): 35 min and 35 min   Short Term Goals: Week 2:  OT Short Term Goal 1 (Week 2): STGs=LTGs  Skilled Therapeutic Interventions/Progress Updates:    Session 1: Therapy session on ADL retraining focused on standing balance, functional transfers, and carryover with AE. Pt's main barrier during therapy session was pain as she required increased time for ADLs and rest breaks. Pt completed UB bathing only on this date secondary to pain. Pt continues to demonstrate good carryover with reacher for LB dressing. Pt demonstrated good energy conservation tech as she requested to don brief and pants then stand only once to manage around waist. Pt required min assist for sit<>stand at RW and steadying assist to manage clothing around waist. Pt left supine in bed with all items in reach.   Session 2: Therapy session focused on activity tolerance, energy conservation tech, and functional transfers during home management task. Pt completed laundry task at supervision level. Pt ambulated from room to laundry room with close supervision and 2 rest breaks. Required verbal cue for RW safety on one occasion when sitting as pt attempted to turn to sit without bringing RW with her. Pt did recall reaching back for arm rest prior to sitting. After completing laundry task pt returned to room and requested to complete toilet task. Ambulated from room into bathroom with close supervision and increased time. Complete toilet hygiene and clothing management with close supervision for standing balance. Pt returned into room and left sitting in recliner chair with all needs in reach.   Therapy Documentation Precautions:  Precautions Precautions: Fall Precaution Comments: h/o fall Restrictions Weight Bearing Restrictions: Yes RLE Weight  Bearing: Partial weight bearing RLE Partial Weight Bearing Percentage or Pounds: 50 Other Position/Activity Restrictions: 50% General: General Amount of Missed OT Time (min): 25 Minutes Vital Signs: Therapy Vitals Temp: 97.8 F (36.6 C) Temp src: Oral Pulse Rate: 74 Resp: 18 BP: 142/96 mmHg Patient Position, if appropriate: Lying Oxygen Therapy SpO2: 96 % O2 Device: None (Room air) Pain: Pt reported 6/10 pain at R hip in session 1 and 4/10 pain at R hip in session 2.   Therapy/Group: Individual Therapy  [name] 07/05/2013, 9:04 AM

## 2013-07-05 NOTE — Progress Notes (Addendum)
Occupational Therapy Note  Patient Details  Name: Kim Nichols MRN: [id number] Date of Birth: [birth year] Today's Date: 07/05/2013  Pt missing 25 min of skilled OT services on this date as pt refused secondary to pain in R hip. Pt reported she had just taken pain medication and they "had not kicked in yet." Provided education on benefits of therapy however pt reported she would not be able to participate until pain decreased. OT checked on pt after 25 min and pt willing to participate for remainder of therapy session.   [name] 07/05/2013, 8:41 AM

## 2013-07-05 NOTE — Progress Notes (Signed)
Speech Language Pathology Weekly Progress Note & Daily Session Note  Patient Details  Name: Kim Nichols MRN: [id number] Date of Birth: [birth year]  Today's Date: 07/05/2013  Short Term Goals: Week 1: SLP Short Term Goal 1 (Week 1): Pt will demonstrate functional problem solving for mildly complex tasks with supervision verbal cues.  SLP Short Term Goal 1 - Progress (Week 1): Met SLP Short Term Goal 2 (Week 1): Pt will utilize external memory aids to recall new, daily information with supervision question cues.  SLP Short Term Goal 2 - Progress (Week 1): Met SLP Short Term Goal 3 (Week 1): Pt will identify 2 cognitive impairments with supervision question and semantic cues.  SLP Short Term Goal 3 - Progress (Week 1): Met SLP Short Term Goal 4 (Week 1): Pt will utilize call bell to express wants/needs with supervision question cues.  SLP Short Term Goal 4 - Progress (Week 1): Met Week 2: SLP Short Term Goal 1 (Week 2): Pt will demonstrate functional problem solving for mildly complex tasks with Mod I  SLP Short Term Goal 2 (Week 2): Pt will utilize external memory aids to recall new, daily information with Mod I.  SLP Short Term Goal 3 (Week 2): Pt. will request help as needed with Supervision level question cues.  Weekly Progress Updates: Patient met 4 out of 4 short golas this reporting period due to gains in intellectual awareness, recall with use of external aids and ability to solve complex problems.  Patient continues to require Supervision cues to organize and find solutions to complex problems without cues; as a resutl it is recommended that she contiue to recieve skilled SLP services to maximize functional independence and reduce burden for care after dischrge given that patient will not have long-term Supervision following dischrage.   SLP Intensity: Minumum of 1-2 x/day, 30 to 90 minutes SLP Frequency: 5 out of 7 days SLP Duration/Estimated Length of Stay: 07/09/13 SLP  Treatment/Interventions: Cognitive remediation/compensation;Cueing hierarchy;Functional tasks;Internal/external aids;Environmental controls;Therapeutic Activities;Patient/family education   Speech Language Pathology Daily Session Note  Patient Details  Name: Kim Nichols MRN: [id number] Date of Birth: 1929/05/23  Today's Date: 07/05/2013 Time: 1420-1500 Time Calculation (min): 40 min  Short Term Goals: Week 2: SLP Short Term Goal 1 (Week 2): Pt will demonstrate functional problem solving for mildly complex tasks with Mod I  SLP Short Term Goal 2 (Week 2): Pt will utilize external memory aids to recall new, daily information with Mod I.  SLP Short Term Goal 3 (Week 2): Pt. will request help as needed with Supervision level question cues.  Skilled Therapeutic Interventions: Skilled treatment session focused on addressing cognition goals.  SLP facilitated session with medication box loading activity with Min faded to Supervision level verbal cues to organize and find solutions for problems that occurred.  SLP also facilitated session with Supervision verbal cues to utilize written aid throughout activity.     FIM:  Comprehension Comprehension Mode: Auditory Comprehension: 6-Follows complex conversation/direction: With extra time/assistive device Expression Expression Mode: Verbal Expression: 6-Expresses complex ideas: With extra time/assistive device Social Interaction Social Interaction: 6-Interacts appropriately with others with medication or extra time (anti-anxiety, antidepressant). Problem Solving Problem Solving: 5-Solves complex 90% of the time/cues < 10% of the time Memory Memory: 5-Requires cues to use assistive device  Pain Pain Assessment Pain Assessment: No/denies pain Pain Score: 2   Therapy/Group: Individual Therapy  Charlane Ferretti., CCC-SLP [phone]  Leonila Speranza 07/05/2013, 2:53 PM

## 2013-07-05 NOTE — Progress Notes (Signed)
Patient ID: Kim Nichols, female   DOB: 04/06/1929, 79 y.o.   MRN: [id number] Subjective/Complaints: No issues overnite reported, pain with activity but participating well No cough or SOB Review of Systems  Constitutional: Negative for fever and chills.  Respiratory: Negative for cough.   Musculoskeletal: Positive for joint pain.  Neurological: Positive for focal weakness.  All other systems reviewed and are negative.    Objective: Vital Signs: Blood pressure 142/96, pulse 70, temperature 97.8 F (36.6 C), temperature source Oral, resp. rate 18, height 5\' 4"  (1.626 m), weight 64.4 kg (141 lb 15.6 oz), SpO2 97.00%. No results found. Results for orders placed during the hospital encounter of 06/25/13 (from the past 72 hour(s))  GLUCOSE, CAPILLARY     Status: Abnormal   Collection Time    07/02/13 11:27 AM      Result Value Range   Glucose-Capillary 214 (*) 70 - 99 mg/dL   Comment 1 Notify RN    GLUCOSE, CAPILLARY     Status: Abnormal   Collection Time    07/02/13  4:22 PM      Result Value Range   Glucose-Capillary 127 (*) 70 - 99 mg/dL  GLUCOSE, CAPILLARY     Status: Abnormal   Collection Time    07/02/13  8:49 PM      Result Value Range   Glucose-Capillary 170 (*) 70 - 99 mg/dL   Comment 1 Notify RN    GLUCOSE, CAPILLARY     Status: Abnormal   Collection Time    07/03/13  7:42 AM      Result Value Range   Glucose-Capillary 128 (*) 70 - 99 mg/dL  GLUCOSE, CAPILLARY     Status: Abnormal   Collection Time    07/03/13 11:41 AM      Result Value Range   Glucose-Capillary 171 (*) 70 - 99 mg/dL   Comment 1 Documented in Chart     Comment 2 Notify RN    GLUCOSE, CAPILLARY     Status: Abnormal   Collection Time    07/03/13  4:41 PM      Result Value Range   Glucose-Capillary 137 (*) 70 - 99 mg/dL   Comment 1 Documented in Chart     Comment 2 Notify RN    GLUCOSE, CAPILLARY     Status: Abnormal   Collection Time    07/03/13  8:32 PM      Result Value Range   Glucose-Capillary 154 (*) 70 - 99 mg/dL  GLUCOSE, CAPILLARY     Status: Abnormal   Collection Time    07/04/13  7:45 AM      Result Value Range   Glucose-Capillary 122 (*) 70 - 99 mg/dL  GLUCOSE, CAPILLARY     Status: Abnormal   Collection Time    07/04/13 11:39 AM      Result Value Range   Glucose-Capillary 148 (*) 70 - 99 mg/dL  GLUCOSE, CAPILLARY     Status: Abnormal   Collection Time    07/04/13  4:58 PM      Result Value Range   Glucose-Capillary 179 (*) 70 - 99 mg/dL  GLUCOSE, CAPILLARY     Status: Abnormal   Collection Time    07/04/13  8:49 PM      Result Value Range   Glucose-Capillary 136 (*) 70 - 99 mg/dL  GLUCOSE, CAPILLARY     Status: Abnormal   Collection Time    07/05/13  7:37 AM      Result  Value Range   Glucose-Capillary 150 (*) 70 - 99 mg/dL   Comment 1 Notify RN       HEENT: Poor dentition Cardio: RRR and No murmurs Resp: CTA B/L and Crackles at both bases GI: BS positive and Non-distended Extremity:  Pulses positive and No Edema Skin:   Wound C/D/I and Right hip with staples lateral aspect, no drainage, no ecchymosis Neuro: Alert/Oriented, Cranial Nerve II-XII normal, Normal Sensory, Abnormal Motor 5/5 bilateral deltoid, bicep, tricep, grip, 2 minus right hip flexor 3 minus right knee extensor for bilateral ankle dorsiflexor plantar flexor and left proximal and Tone:  Within Normal Limits Musc/Skel:  Swelling Right thigh and right calf General no acute distress Hip pain with reduced passive range of motion  Assessment/Plan: 1. Functional deficits secondary to right subcapital hip fracture with post operative pneumonia and DVT which require 3+ hours per day of interdisciplinary therapy in a comprehensive inpatient rehab setting. Physiatrist is providing close team supervision and 24 hour management of active medical problems listed below. Physiatrist and rehab team continue to assess barriers to discharge/monitor patient progress toward functional and  medical goals. FIM: FIM - Bathing Bathing Steps Patient Completed: Chest;Right Arm;Left Arm;Abdomen;Front perineal area;Buttocks (washed UB only) Bathing: 5: Set-up assist to: Obtain items  FIM - Upper Body Dressing/Undressing Upper body dressing/undressing steps patient completed: Thread/unthread left sleeve of pullover shirt/dress;Put head through opening of pull over shirt/dress;Thread/unthread right sleeve of pullover shirt/dresss;Pull shirt over trunk Upper body dressing/undressing: 5: Set-up assist to: Obtain clothing/put away FIM - Lower Body Dressing/Undressing Lower body dressing/undressing steps patient completed: Thread/unthread right underwear leg;Thread/unthread left underwear leg;Thread/unthread right pants leg;Thread/unthread left pants leg;Pull underwear up/down;Pull pants up/down Lower body dressing/undressing: 4: Min-Patient completed 75 plus % of tasks  FIM - Toileting Toileting steps completed by patient: Adjust clothing prior to toileting;Performs perineal hygiene;Adjust clothing after toileting Toileting Assistive Devices: Grab bar or rail for support Toileting: 4: Steadying assist  FIM - Diplomatic Services operational officer Devices: Walker;Elevated toilet seat Toilet Transfers: 5-From toilet/BSC: Supervision (verbal cues/safety issues);5-To toilet/BSC: Supervision (verbal cues/safety issues)  FIM - Banker Devices: Manufacturing systems engineer Transfer: 3: Supine > Sit: Mod A (lifting assist/Pt. 50-74%/lift 2 legs;4: Sit > Supine: Min A (steadying pt. > 75%/lift 1 leg)  FIM - Locomotion: Wheelchair Distance: 55 Locomotion: Wheelchair: 0: Activity did not occur FIM - Locomotion: Ambulation Locomotion: Ambulation Assistive Devices: Designer, industrial/product Ambulation/Gait Assistance: 5: Supervision Locomotion: Ambulation: 2: Travels 50 - 149 ft with supervision/safety issues  Comprehension Comprehension Mode: Auditory Comprehension:  6-Follows complex conversation/direction: With extra time/assistive device  Expression Expression Mode: Verbal Expression: 6-Expresses complex ideas: With extra time/assistive device  Social Interaction Social Interaction: 6-Interacts appropriately with others with medication or extra time (anti-anxiety, antidepressant).  Problem Solving Problem Solving: 5-Solves basic problems: With no assist  Memory Memory: 4-Recognizes or recalls 75 - 89% of the time/requires cueing 10 - 24% of the time  Medical Problem List and Plan:  1. Right subcapital femur fracture. Status post ORIF 06/18/2013 at [facility]. Partial weightbearing right lower extremity, Repeat Hip Xrays show good hardware position 2. DVT Prophylaxis/Anticoagulation: Continue Xarelto for deep vein thrombosis right gastrocnemius vein  3. Pain Management: Neurontin 200 mg 3 times daily, oxycodone immediate release 5 mg every 6 hours as needed. Monitor mental status  4. Mood/delirium/confusion. Limit narcotics as much as possible. Recent cranial CT scan negative. Mental status continues to improve and she appeared fairly functional today. Continue Klonopin 1 mg each  bedtime and Cymbalta 60 mg daily. Provide emotional support  5. Neuropsych: This patient is capable of making decisions on her own behalf.  6. Acute blood loss anemia. Followup CBC.Hgb slightly improved Patient is Jehovah witness---no blood products. Continue iron supplement  7. Hypertension. Clonidine patch 0.1 mg weekly, Vasotec 20 mg daily, Plendil 5 mg daily. Monitor with increased mobility  8. Parkinson's disease versus movement disorder. Sinemet discontinued secondary to possible contributor of confusion  9. Diet controlled diabetes mellitus. Check blood sugars a.c. and at bedtime  10. Pneumonia. All antibiotics since discontinued. Oxygen saturations greater than 90% on room air except at noc. Latest chest x-ray chronic changes no visible effusions or  acute abnormality , will repeat    LOS (Days) 10 A FACE TO FACE EVALUATION WAS PERFORMED  KIRSTEINS,ANDREW E 07/05/2013, 9:50 AM

## 2013-07-06 ENCOUNTER — Inpatient Hospital Stay (HOSPITAL_COMMUNITY): Payer: Medicare Other

## 2013-07-06 ENCOUNTER — Inpatient Hospital Stay (HOSPITAL_COMMUNITY): Payer: Medicare Other | Admitting: Speech Pathology

## 2013-07-06 ENCOUNTER — Encounter (HOSPITAL_COMMUNITY): Payer: Medicare Other

## 2013-07-06 ENCOUNTER — Inpatient Hospital Stay (HOSPITAL_COMMUNITY): Payer: Medicare Other | Admitting: Physical Therapy

## 2013-07-06 DIAGNOSIS — G92 Toxic encephalopathy: Secondary | ICD-10-CM

## 2013-07-06 DIAGNOSIS — E119 Type 2 diabetes mellitus without complications: Secondary | ICD-10-CM

## 2013-07-06 DIAGNOSIS — D62 Acute posthemorrhagic anemia: Secondary | ICD-10-CM

## 2013-07-06 DIAGNOSIS — I10 Essential (primary) hypertension: Secondary | ICD-10-CM

## 2013-07-06 DIAGNOSIS — S72009A Fracture of unspecified part of neck of unspecified femur, initial encounter for closed fracture: Secondary | ICD-10-CM

## 2013-07-06 LAB — GLUCOSE, CAPILLARY
Glucose-Capillary: 151 mg/dL — ABNORMAL HIGH (ref 70–99)
Glucose-Capillary: 126 mg/dL — ABNORMAL HIGH (ref 70–99)

## 2013-07-06 MED ORDER — RIVAROXABAN 15 MG PO TABS
15.0000 mg | ORAL_TABLET | Freq: Two times a day (BID) | ORAL | Status: DC
  Administered 2013-07-06 – 2013-07-09 (×6): 15 mg via ORAL
  Filled 2013-07-06 (×8): qty 1

## 2013-07-06 MED ORDER — RIVAROXABAN 20 MG PO TABS: 20.0000 mg | ORAL_TABLET | Freq: Every day | ORAL | Status: DC

## 2013-07-06 NOTE — Progress Notes (Signed)
Occupational Therapy Session Note  Patient Details  Name: Kim Nichols MRN: [id number] Date of Birth: Nov 13, 1929  Today's Date: 07/06/2013 Time: 7425-9563 and [id number]  Time Calculation (min): 60 min and 45 min   Short Term Goals: Week 2:  OT Short Term Goal 1 (Week 2): STGs=LTGs  Skilled Therapeutic Interventions/Progress Updates:    Session 1: Therapy session focused functional transfers, activity tolerance, standing balance, and safety awareness. Pt demonstrates good carryover with AE and required close supervision for standing balance to manage clothing around waist. Pt completed shower with supervision however had lob laterally to L when standing and drying buttocks area. Pt required mod assist to correct. Pt completed oral care in standing with close supervision for standing balance. Pt managed RLE on and off bed during therapy session with slight increased time. Pt required cues on one occasion with safety during sit<>stand as she did not reach back for arm rest. Pt required 3 rest breaks during therapy session however determined appropriate times for rest breaks without cues.   Session 2: Therapy session focused on safety awareness, standing balance, functional transfers, and activity tolerance. Pt engaged in light meal prep microwave activity. Pt completed task with supervision requiring verbal cues for safety tech when reaching into overhead cabinets for items. Pt attempted to reach too far forward and educated on importance ambulating closer to countertop so she is not reaching too far from bos. Educated on why this is a fall risk and unsafe. At end of session teach back used and pt verbalized being closer to counter tops for reaching. Pt reported she keeps everything in easy to reach places.   Therapy Documentation Precautions:  Precautions Precautions: Fall Precaution Comments: h/o fall Restrictions Weight Bearing Restrictions: Yes RLE Weight Bearing: Partial weight bearing RLE  Partial Weight Bearing Percentage or Pounds: 50 Other Position/Activity Restrictions: 50% General:   Vital Signs:   Pain: Pt reported 4/10 pain in R hip.   See FIM for current functional status  Therapy/Group: Individual Therapy  [name] 07/06/2013, 10:44 AM

## 2013-07-06 NOTE — Progress Notes (Signed)
Patient ID: Kim Nichols, female   DOB: 23-Feb-1929, 79 y.o.   MRN: [id number] Subjective/Complaints:  Slept well, pain control improved  Review of Systems  Constitutional: Negative for fever and chills.  Respiratory: Negative for cough.   Musculoskeletal: Positive for joint pain.  Neurological: Positive for focal weakness.  All other systems reviewed and are negative.    Objective: Vital Signs: Blood pressure 138/61, pulse 67, temperature 97.6 F (36.4 C), temperature source Oral, resp. rate 17, height 5\' 4"  (1.626 m), weight 64.4 kg (141 lb 15.6 oz), SpO2 97.00%. No results found. Results for orders placed during the hospital encounter of 06/25/13 (from the past 72 hour(s))  GLUCOSE, CAPILLARY     Status: Abnormal   Collection Time    07/03/13 11:41 AM      Result Value Range   Glucose-Capillary 171 (*) 70 - 99 mg/dL   Comment 1 Documented in Chart     Comment 2 Notify RN    GLUCOSE, CAPILLARY     Status: Abnormal   Collection Time    07/03/13  4:41 PM      Result Value Range   Glucose-Capillary 137 (*) 70 - 99 mg/dL   Comment 1 Documented in Chart     Comment 2 Notify RN    GLUCOSE, CAPILLARY     Status: Abnormal   Collection Time    07/03/13  8:32 PM      Result Value Range   Glucose-Capillary 154 (*) 70 - 99 mg/dL  GLUCOSE, CAPILLARY     Status: Abnormal   Collection Time    07/04/13  7:45 AM      Result Value Range   Glucose-Capillary 122 (*) 70 - 99 mg/dL  GLUCOSE, CAPILLARY     Status: Abnormal   Collection Time    07/04/13 11:39 AM      Result Value Range   Glucose-Capillary 148 (*) 70 - 99 mg/dL  GLUCOSE, CAPILLARY     Status: Abnormal   Collection Time    07/04/13  4:58 PM      Result Value Range   Glucose-Capillary 179 (*) 70 - 99 mg/dL  GLUCOSE, CAPILLARY     Status: Abnormal   Collection Time    07/04/13  8:49 PM      Result Value Range   Glucose-Capillary 136 (*) 70 - 99 mg/dL  GLUCOSE, CAPILLARY     Status: Abnormal   Collection Time     07/05/13  7:37 AM      Result Value Range   Glucose-Capillary 150 (*) 70 - 99 mg/dL   Comment 1 Notify RN    GLUCOSE, CAPILLARY     Status: Abnormal   Collection Time    07/05/13 12:08 PM      Result Value Range   Glucose-Capillary 168 (*) 70 - 99 mg/dL   Comment 1 Notify RN    GLUCOSE, CAPILLARY     Status: Abnormal   Collection Time    07/05/13  4:25 PM      Result Value Range   Glucose-Capillary 147 (*) 70 - 99 mg/dL  GLUCOSE, CAPILLARY     Status: Abnormal   Collection Time    07/05/13  9:16 PM      Result Value Range   Glucose-Capillary 147 (*) 70 - 99 mg/dL  GLUCOSE, CAPILLARY     Status: Abnormal   Collection Time    07/06/13  7:05 AM      Result Value Range   Glucose-Capillary 130 (*)  70 - 99 mg/dL   Comment 1 Notify RN       HEENT: Poor dentition Cardio: RRR and No murmurs Resp: CTA B/L and Crackles at both bases GI: BS positive and Non-distended Extremity:  Pulses positive and No Edema Skin:   Wound C/D/I and Right hip with staples lateral aspect, no drainage, no ecchymosis Neuro: Alert/Oriented, Cranial Nerve II-XII normal, Normal Sensory, Abnormal Motor 5/5 bilateral deltoid, bicep, tricep, grip, 2 minus right hip flexor 3 minus right knee extensor for bilateral ankle dorsiflexor plantar flexor and left proximal and Tone:  Within Normal Limits Musc/Skel:  Swelling Right thigh and right calf General no acute distress Hip pain with reduced passive range of motion  Assessment/Plan: 1. Functional deficits secondary to right subcapital hip fracture with post operative pneumonia and DVT which require 3+ hours per day of interdisciplinary therapy in a comprehensive inpatient rehab setting. Physiatrist is providing close team supervision and 24 hour management of active medical problems listed below. Physiatrist and rehab team continue to assess barriers to discharge/monitor patient progress toward functional and medical goals. FIM: FIM - Bathing Bathing Steps Patient  Completed: Chest;Right Arm;Left Arm;Abdomen;Front perineal area;Buttocks (washed UB only) Bathing: 5: Set-up assist to: Obtain items  FIM - Upper Body Dressing/Undressing Upper body dressing/undressing steps patient completed: Thread/unthread left sleeve of pullover shirt/dress;Put head through opening of pull over shirt/dress;Thread/unthread right sleeve of pullover shirt/dresss;Pull shirt over trunk Upper body dressing/undressing: 5: Set-up assist to: Obtain clothing/put away FIM - Lower Body Dressing/Undressing Lower body dressing/undressing steps patient completed: Thread/unthread right underwear leg;Thread/unthread left underwear leg;Thread/unthread right pants leg;Thread/unthread left pants leg;Pull underwear up/down;Pull pants up/down Lower body dressing/undressing: 4: Min-Patient completed 75 plus % of tasks  FIM - Toileting Toileting steps completed by patient: Adjust clothing prior to toileting;Performs perineal hygiene;Adjust clothing after toileting Toileting Assistive Devices: Grab bar or rail for support Toileting: 5: Supervision: Safety issues/verbal cues  FIM - Diplomatic Services operational officer Devices: Walker;Elevated toilet seat Toilet Transfers: 5-From toilet/BSC: Supervision (verbal cues/safety issues);5-To toilet/BSC: Supervision (verbal cues/safety issues)  FIM - Banker Devices: Therapist, occupational: 5: Supine > Sit: Supervision (verbal cues/safety issues);5: Bed > Chair or W/C: Supervision (verbal cues/safety issues);5: Chair or W/C > Bed: Supervision (verbal cues/safety issues)  FIM - Locomotion: Wheelchair Distance: 150 Locomotion: Wheelchair: 1: Total Assistance/staff pushes wheelchair (Pt<25%) FIM - Locomotion: Ambulation Locomotion: Ambulation Assistive Devices: Designer, industrial/product Ambulation/Gait Assistance: 5: Supervision Locomotion: Ambulation: 2: Travels 50 - 149 ft with supervision/safety  issues  Comprehension Comprehension Mode: Auditory Comprehension: 6-Follows complex conversation/direction: With extra time/assistive device  Expression Expression Mode: Verbal Expression: 6-Expresses complex ideas: With extra time/assistive device  Social Interaction Social Interaction: 6-Interacts appropriately with others with medication or extra time (anti-anxiety, antidepressant).  Problem Solving Problem Solving: 5-Solves complex 90% of the time/cues < 10% of the time  Memory Memory: 5-Requires cues to use assistive device  Medical Problem List and Plan:  1. Right subcapital femur fracture. Status post [facility]IF [facility]/18/2013 at [facility]. Partial weightbearing right lower extremity, Repeat Hip Xrays show good hardware position 2. DVT Prophylaxis/Anticoagulation: Continue Xarelto for deep vein thrombosis right gastrocnemius vein  3. Pain Management: Neurontin 200 mg 3 times daily, oxycodone immediate release 5 mg every 6 hours as needed. Monitor mental status  4. Mood/delirium/confusion. Limit narcotics as much as possible. Recent cranial CT scan negative. Mental status continues to improve and she appeared fairly functional today. Continue Klonopin 1 mg each bedtime and Cymbalta 60 mg  daily. Provide emotional support  5. Neuropsych: This patient is capable of making decisions on her own behalf.  6. Acute blood loss anemia. Followup CBC.Hgb slightly improved Patient is Jehovah witness---no blood products. Continue iron supplement  7. Hypertension. Clonidine patch 0.1 mg weekly, Vasotec 20 mg daily, Plendil 5 mg daily. Monitor with increased mobility  8. Parkinson's disease versus movement disorder. Sinemet discontinued secondary to possible contributor of confusion  9. Diet controlled diabetes mellitus. Check blood sugars a.c. and at bedtime  10. Pneumonia.resolved    LOS (Days) 11 A FACE TO FACE EVALUATION WAS PERFORMED  KIRSTEINS,ANDREW E 07/06/2013, 9:38 AM

## 2013-07-06 NOTE — Progress Notes (Signed)
Physical Therapy Session Note  Patient Details  Name: Kim Nichols MRN: [id number] Date of Birth: 08/25/1929  Today's Date: 07/06/2013 Time: 3086-5784 Time Calculation (min): 49 min  Short Term Goals: Week 2:  PT Short Term Goal 1 (Week 2): STGs=LTGs  Skilled Therapeutic Interventions/Progress Updates:    This session focused on bed mobility supervision with HOB elevated and bed rail.  Pt keeps legs adducted when moving them both to EOB.  Sit to stand from bed with upper extremity assist supervision with RW.  Gait 110' with RW supervision, cues for upright posture.  Pt's right knee is very valgus compared to her left knee.  I am not sure if this was present PTA.  Car transfer supervision with verbal cues for safety, strategies on making it easier to get her legs into the car.  Seated leg exercises: heel slides with towel under foot to try to increase knee flexion ROM right leg x 10 each R only, LAQs bil x 10 each in sitting.  Sit to supine supervision with verbal cues for technique.  Supine exercises: ankle pumps, quad sets, AA SLR, hip abduction, heel slides, hip adduction against pillow with knee extension, glute sets, bridges (AA) x 10 each bil.  Supine to sit with supervision extra time needed to successfully complete task with cues for sequencing.  Curb step forwards with RW min guard assist.  Pt showed correct sequencing of legs and management of RW.  Stairs 3, 6" with one railing and therapist's hand held assist (simulating her back steps with railings that she thinks are too far apart to reach) min assist to help unweight right leg.  Toilet transfer with RW and grab bars supervision.  Pt managing pants and peri care.  I flushed the toilet.  Dynamic standing balance at sink washing hands with trunk supported at waist leaning against sink, but no upper extremity support supervision.  Pt positioned back in bed sit to supine supervision.  Bridge to move up to [facility] min assist to stabilize bil legs for  push.  Pt with some STM deficits during treatment multiple times mentioned, "i'll have to ask my daughter that" in reference to things she should know.  She talked about returning to [facility] with her daughter after the two weeks are up that her daughter can stay here.    Therapy Documentation Precautions:  Precautions Precautions: Fall Precaution Comments: h/o fall Restrictions Weight Bearing Restrictions: Yes RLE Weight Bearing: Partial weight bearing RLE Partial Weight Bearing Percentage or Pounds: 50 Other Position/Activity Restrictions: 50%   Vital Signs: Therapy Vitals Temp: 98.3 F (36.8 C) Temp src: Oral Pulse Rate: 70 Resp: 18 BP: 107/58 mmHg Patient Position, if appropriate: Sitting Oxygen Therapy SpO2: 95 % O2 Device: None (Room air) Pain: Pain Assessment Pain Assessment: 0-10 Pain Score: 5  Pain Type: Surgical pain Pain Location: Hip Pain Orientation: Right Pain Descriptors / Indicators: Aching Pain Frequency: Intermittent Pain Onset: Gradual Patients Stated Pain Goal: 3 Pain Intervention(s): Medication (See eMAR)  Locomotion : Ambulation Ambulation/Gait Assistance: 5: Supervision   See FIM for current functional status  Therapy/Group: Individual Therapy  Lurena Joiner B. Casen Pryor, PT, DPT [phone]   07/06/2013, 4:40 PM

## 2013-07-06 NOTE — Progress Notes (Signed)
Speech Language Pathology Daily Session Note  Patient Details  Name: Majestic Molony MRN: [id number] Date of Birth: 10-30-1929  Today's Date: 07/06/2013 Time: 4098-1191 Time Calculation (min): 30 min  Short Term Goals: Week 2: SLP Short Term Goal 1 (Week 2): Pt will demonstrate functional problem solving for mildly complex tasks with Mod I  SLP Short Term Goal 2 (Week 2): Pt will utilize external memory aids to recall new, daily information with Mod I.  SLP Short Term Goal 3 (Week 2): Pt. will request help as needed with Supervision level question cues.  Skilled Therapeutic Interventions: Skilled treatment session focused on addressing cognitive recovery.  SLP facilitated session with task that focused on education and use of association strategies to assist with working memory recall.  Patient required Supervision level assist semantic cues to utilize association strategies.  Continue with current plan of care.   FIM:  Comprehension Comprehension Mode: Auditory Comprehension: 6-Follows complex conversation/direction: With extra time/assistive device Expression Expression Mode: Verbal Expression: 6-Expresses complex ideas: With extra time/assistive device Social Interaction Social Interaction: 6-Interacts appropriately with others with medication or extra time (anti-anxiety, antidepressant). Problem Solving Problem Solving: 5-Solves complex 90% of the time/cues < 10% of the time Memory Memory: 5-Recognizes or recalls 90% of the time/requires cueing < 10% of the time FIM - Eating Eating Activity: 6: More than reasonable amount of time  Pain Pain Assessment Pain Assessment: 0-10 Pain Score: 5  Pain Type: Surgical pain Pain Location: Hip Pain Orientation: Right Pain Descriptors / Indicators: Aching Pain Frequency: Intermittent Pain Onset: Gradual Patients Stated Pain Goal: 3 Pain Intervention(s): RN notified and Medication administered   Therapy/Group: Individual  Therapy  Charlane Ferretti., CCC-SLP [phone]  Brexley Cutshaw 07/06/2013, 3:57 PM

## 2013-07-07 ENCOUNTER — Inpatient Hospital Stay (HOSPITAL_COMMUNITY): Payer: Medicare Other | Admitting: Speech Pathology

## 2013-07-07 ENCOUNTER — Inpatient Hospital Stay (HOSPITAL_COMMUNITY): Payer: Medicare Other

## 2013-07-07 ENCOUNTER — Inpatient Hospital Stay (HOSPITAL_COMMUNITY): Payer: Medicare Other | Admitting: Physical Therapy

## 2013-07-07 LAB — GLUCOSE, CAPILLARY

## 2013-07-07 NOTE — Progress Notes (Signed)
Speech Language Pathology Daily Session Note & Discharge Summary   Patient Details  Name: Kim Nichols MRN: [id number] Date of Birth: 04/13/1929  Today's Date: 07/07/2013 Time: 4098-1191 Time Calculation (min): 40 min  Short Term Goals: Week 2: SLP Short Term Goal 1 (Week 2): Pt will demonstrate functional problem solving for mildly complex tasks with Mod I  SLP Short Term Goal 2 (Week 2): Pt will utilize external memory aids to recall new, daily information with Mod I.  SLP Short Term Goal 3 (Week 2): Pt. will request help as needed with Supervision level question cues.  Skilled Therapeutic Interventions: Skilled treatment session focused on addressing cognitive-linguistic goals.  SLP facilitated session with financial management task and increased wait time to self-correct errors with paper and pencil.  SLP also finalized education regarding use of compensatory strategies for recall with medications.  Patient verbalized understanding of information.   FIM:  Comprehension Comprehension Mode: Auditory Comprehension: 6-Follows complex conversation/direction: With extra time/assistive device Expression Expression Mode: Verbal Expression: 6-Expresses complex ideas: With extra time/assistive device Social Interaction Social Interaction: 6-Interacts appropriately with others with medication or extra time (anti-anxiety, antidepressant). Problem Solving Problem Solving: 6-Solves complex problems: With extra time Memory Memory: 6-Assistive device: No helper  Pain Pain Assessment Pain Assessment: No/denies pain  Therapy/Group: Individual Therapy   Speech Language Pathology Discharge Summary  Patient Details  Name: Kim Nichols MRN: [id number] Date of Birth: 1929/01/03  Today's Date: 07/07/2013  Patient has met 3 of 3 long term goals.  Patient to discharge at overall Modified Independent;Supervision level.  Reasons goals not met: n/a   Clinical Impression/Discharge Summary: Patient  met 3 out of 3 long term goals this reporting period due to gains in awareness of deficits and recall of hip precautions, as well as recall of new medical information with use of external aids and ability to solve complex problems with overall Mod I.  Patient occasionally questions new tasks but after cues x1 she is able to carryover.  Patient has daughter staying with her for assist for a few weeks after discharge to assist with transition and need for occasional cues as needed; anticipate that patient will be Mod I at that point and no further skilled SLP service are warranted.  If recall deficits impact function at home recommend initiation of home health therapy.   Care Partner:  Caregiver Able to Provide Assistance: Yes  Type of Caregiver Assistance: Cognitive (short term )  Recommendation:  24 hour supervision/assistance (short term)  Rationale for SLP Follow Up: Maximize cognitive function and independence   Equipment: none   Reasons for discharge: Treatment goals met   Patient/Family Agrees with Progress Made and Goals Achieved: Yes   See FIM for current functional status  Charlane Ferretti., CCC-SLP [phone]  Kim Nichols 07/07/2013, 4:38 PM

## 2013-07-07 NOTE — Progress Notes (Signed)
Occupational Therapy Session Note  Patient Details  Name: Kim Nichols MRN: [id number] Date of Birth: 1929/01/09  Today's Date: 07/07/2013 Time: 1300-1330 Time Calculation (min): 30 min  Short Term Goals: Focus on LTGs  Skilled Therapeutic Interventions/Progress Updates:    Therapy session focused on functional transfers, activity tolerance, and dynamic standing balance during ADL and IADL tasks. Pt completed toileting task with supervision and ambulated from room to bathroom and back with supervision using RW. Completed oral care in standing then required rest break. Engaged in home management task in room with supervision. Ambulated throughout room with supervision using RW as she reached into low cabinets to place clothing. Good safety with positioning of self when reaching and recall to place items over front of RW when transporting them. Good energy conservation carryover as pt determined she could sit eob to safely complete some of activity as she was fatigued at end of session.   Therapy Documentation Precautions:  Precautions Precautions: Fall Precaution Comments: h/o fall Restrictions Weight Bearing Restrictions: Yes RLE Weight Bearing: Partial weight bearing RLE Partial Weight Bearing Percentage or Pounds: 50 Other Position/Activity Restrictions: 50% General:   Vital Signs:   Pain: Pain Assessment Pain Assessment: 0-10 Pain Score: 4  Pain Type: Surgical pain Pain Location: Hip Pain Orientation: Right Pain Descriptors / Indicators: Aching Pain Frequency: Intermittent Pain Onset: Gradual Patients Stated Pain Goal: 3 Pain Intervention(s): Medication (See eMAR);Repositioned Multiple Pain Sites: No ADL: ADL Eating: Independent Grooming: Minimal assistance Where Assessed-Grooming: Sitting at sink Upper Body Bathing: Minimal assistance Where Assessed-Upper Body Bathing: Wheelchair;Sitting at sink Lower Body Bathing: Moderate assistance Where Assessed-Lower Body  Bathing: Standing at sink;Wheelchair Upper Body Dressing: Setup Where Assessed-Upper Body Dressing: Wheelchair Lower Body Dressing: Moderate assistance Where Assessed-Lower Body Dressing: Sitting at sink;Standing at sink;Wheelchair Toileting: Not assessed Toilet Transfer: Not assessed Toilet Transfer Method: Not assessed Tub/Shower Transfer: Not assessed Tub/Shower Transfer Method: Unable to assess Exercises:   Other Treatments:    See FIM for current functional status  Therapy/Group: Individual Therapy  [name] 07/07/2013, 3:23 PM

## 2013-07-07 NOTE — Progress Notes (Signed)
Patient ID: Kim Nichols, female   DOB: [birth year], 79 y.o.   MRN: [id number] Subjective/Complaints: Breathing better, no problems with sleep or with pain Review of Systems  Constitutional: Negative for fever and chills.  Respiratory: Negative for cough.   Musculoskeletal: Positive for joint pain.  Neurological: Positive for focal weakness.  All other systems reviewed and are negative.    Objective: Vital Signs: Blood pressure 130/67, pulse 68, temperature 98.1 F (36.7 C), temperature source Oral, resp. rate 17, height 5\' 4"  (1.626 m), weight 64.4 kg (141 lb 15.6 oz), SpO2 92.00%. No results found. Results for orders placed during the hospital encounter of 06/25/13 (from the past 72 hour(s))  GLUCOSE, CAPILLARY     Status: Abnormal   Collection Time    07/04/13  4:58 PM      Result Value Range   Glucose-Capillary 179 (*) 70 - 99 mg/dL  GLUCOSE, CAPILLARY     Status: Abnormal   Collection Time    07/04/13  8:49 PM      Result Value Range   Glucose-Capillary 136 (*) 70 - 99 mg/dL  GLUCOSE, CAPILLARY     Status: Abnormal   Collection Time    07/05/13  7:37 AM      Result Value Range   Glucose-Capillary 150 (*) 70 - 99 mg/dL   Comment 1 Notify RN    GLUCOSE, CAPILLARY     Status: Abnormal   Collection Time    07/05/13 12:08 PM      Result Value Range   Glucose-Capillary 168 (*) 70 - 99 mg/dL   Comment 1 Notify RN    GLUCOSE, CAPILLARY     Status: Abnormal   Collection Time    07/05/13  4:25 PM      Result Value Range   Glucose-Capillary 147 (*) 70 - 99 mg/dL  GLUCOSE, CAPILLARY     Status: Abnormal   Collection Time    07/05/13  9:16 PM      Result Value Range   Glucose-Capillary 147 (*) 70 - 99 mg/dL  GLUCOSE, CAPILLARY     Status: Abnormal   Collection Time    07/06/13  7:05 AM      Result Value Range   Glucose-Capillary 130 (*) 70 - 99 mg/dL   Comment 1 Notify RN    GLUCOSE, CAPILLARY     Status: Abnormal   Collection Time    07/06/13 11:23 AM      Result  Value Range   Glucose-Capillary 151 (*) 70 - 99 mg/dL   Comment 1 Notify RN    GLUCOSE, CAPILLARY     Status: Abnormal   Collection Time    07/06/13  4:36 PM      Result Value Range   Glucose-Capillary 124 (*) 70 - 99 mg/dL   Comment 1 Notify RN    GLUCOSE, CAPILLARY     Status: Abnormal   Collection Time    07/06/13  8:51 PM      Result Value Range   Glucose-Capillary 126 (*) 70 - 99 mg/dL   Comment 1 Notify RN    GLUCOSE, CAPILLARY     Status: Abnormal   Collection Time    07/07/13  7:25 AM      Result Value Range   Glucose-Capillary 118 (*) 70 - 99 mg/dL   Comment 1 Notify RN    GLUCOSE, CAPILLARY     Status: Abnormal   Collection Time    07/07/13 12:05 PM  Result Value Range   Glucose-Capillary 174 (*) 70 - 99 mg/dL   Comment 1 Notify RN       HEENT: Poor dentition Cardio: RRR and No murmurs Resp: CTA B/L and Crackles at both bases GI: BS positive and Non-distended Extremity:  Pulses positive and No Edema Skin:   Wound C/D/I and Right hip with staples lateral aspect, no drainage, no ecchymosis Neuro: Alert/Oriented, Cranial Nerve II-XII normal, Normal Sensory, Abnormal Motor 5/5 bilateral deltoid, bicep, tricep, grip, 2 minus right hip flexor 3 minus right knee extensor for bilateral ankle dorsiflexor plantar flexor and left proximal and Tone:  Within Normal Limits Musc/Skel:  Swelling Right thigh and right calf General no acute distress Hip pain with reduced passive range of motion  Assessment/Plan: 1. Functional deficits secondary to right subcapital hip fracture with post operative pneumonia and DVT which require 3+ hours per day of interdisciplinary therapy in a comprehensive inpatient rehab setting. Physiatrist is providing close team supervision and 24 hour management of active medical problems listed below. Physiatrist and rehab team continue to assess barriers to discharge/monitor patient progress toward functional and medical goals. FIM: FIM -  Bathing Bathing Steps Patient Completed: Chest;Right Arm;Left Arm;Abdomen;Front perineal area;Buttocks;Right upper leg;Left upper leg;Right lower leg (including foot);Left lower leg (including foot) Bathing: 4: Steadying assist  FIM - Upper Body Dressing/Undressing Upper body dressing/undressing steps patient completed: Thread/unthread right sleeve of pullover shirt/dresss;Thread/unthread left sleeve of pullover shirt/dress;Put head through opening of pull over shirt/dress;Pull shirt over trunk Upper body dressing/undressing: 5: Set-up assist to: Obtain clothing/put away FIM - Lower Body Dressing/Undressing Lower body dressing/undressing steps patient completed: Thread/unthread right pants leg;Thread/unthread left pants leg;Pull pants up/down;Don/Doff right sock;Don/Doff left sock Lower body dressing/undressing: 5: Set-up assist to: Don/Doff TED stocking  FIM - Toileting Toileting steps completed by patient: Adjust clothing prior to toileting;Performs perineal hygiene;Adjust clothing after toileting Toileting Assistive Devices: Grab bar or rail for support Toileting: 5: Supervision: Safety issues/verbal cues  FIM - Diplomatic Services operational officer Devices: Best boy Transfers: 5-To toilet/BSC: Supervision (verbal cues/safety issues);5-From toilet/BSC: Supervision (verbal cues/safety issues)  FIM - Press photographer Assistive Devices: Walker;HOB elevated Bed/Chair Transfer: 5: Supine > Sit: Supervision (verbal cues/safety issues);5: Bed > Chair or W/C: Supervision (verbal cues/safety issues) (EOB <> 2WW)  FIM - Locomotion: Wheelchair Distance: 150 Locomotion: Wheelchair: 1: Total Assistance/staff pushes wheelchair (Pt<25%) FIM - Locomotion: Ambulation Locomotion: Ambulation Assistive Devices: Designer, industrial/product Ambulation/Gait Assistance: 5: Supervision Locomotion: Ambulation: 2: Travels 50 - 149 ft with supervision/safety  issues  Comprehension Comprehension Mode: Auditory Comprehension: 6-Follows complex conversation/direction: With extra time/assistive device  Expression Expression Mode: Verbal Expression: 6-Expresses complex ideas: With extra time/assistive device  Social Interaction Social Interaction: 6-Interacts appropriately with others with medication or extra time (anti-anxiety, antidepressant).  Problem Solving Problem Solving: 5-Solves complex 90% of the time/cues < 10% of the time  Memory Memory: 6-More than reasonable amt of time  Medical Problem List and Plan:  1. Right subcapital femur fracture. Status post ORIF 06/18/2013 at [facility]. Partial weightbearing right lower extremity, Repeat Hip Xrays show good hardware position 2. DVT Prophylaxis/Anticoagulation: Continue Xarelto for deep vein thrombosis right gastrocnemius vein  3. Pain Management: Neurontin 200 mg 3 times daily, oxycodone immediate release 5 mg every 6 hours as needed. Monitor mental status  4. Mood/delirium/confusion. Limit narcotics as much as possible. Recent cranial CT scan negative. Mental status continues to improve and she appeared fairly functional today. Continue Klonopin 1 mg each bedtime and  Cymbalta 60 mg daily. Provide emotional support  5. Neuropsych: This patient is capable of making decisions on her own behalf.  6. Acute blood loss anemia. Followup CBC.Hgb slightly improved Patient is Jehovah witness---no blood products. Continue iron supplement  7. Hypertension. Clonidine patch 0.1 mg weekly, Vasotec 20 mg daily, Plendil 5 mg daily. Monitor with increased mobility  8. Parkinson's disease versus movement disorder. Sinemet discontinued secondary to possible contributor of confusion  9. Diet controlled diabetes mellitus. Check blood sugars a.c. and at bedtime  10. Pneumonia.resolved    LOS (Days) 12 A FACE TO FACE EVALUATION WAS PERFORMED  KIRSTEINS,ANDREW E 07/07/2013, 2:34 PM

## 2013-07-07 NOTE — Progress Notes (Signed)
Physical Therapy Session Note  Patient Details  Name: Kim Nichols MRN: [id number] Date of Birth: 1929/07/13  Today's Date: 07/07/2013 Time:Session #1: 1027-2536, Session #2:  1337-1400 Time Calculation (min): Session #1: 27 min, Session #2: 23 min  Short Term Goals: Week 2:  PT Short Term Goal 1 (Week 2): STGs=LTGs  Skilled Therapeutic Interventions/Progress Updates:    Session #1: This session focused on gait with RW 95' over indoor and household surfaces and areas supervision.  Verbal cues for upright posture and RW adjusted up one notch to try to encourage more upright posture.  In ADL apartment practiced getting into and out of both sides of the elevated bed supervision with cues for safety and technique.  Furniture transfers on and off of the couch with education on sitting surface choices at discharge.   Session #2: This session focused on leg strengthening and ROM exercises.  Seated exercises: heel/toe raises, LAQs, heel slides, seated marches, hip adduct against pillow for resistance x 10 each bil.  Supine exercises: ankle pumps, quad sets, SAQs, heel slides, hip abduction, towel squeezes, SLR, glute sets, bridges all x 10 each bil.  Standing exercises: hip abduction (R only), hip/knee flexion (R only), heel raises and toe raises (bil) x 10 reps each exercise.     Therapy Documentation Precautions:  Precautions Precautions: Fall Precaution Comments: h/o fall Restrictions Weight Bearing Restrictions: Yes RLE Weight Bearing: Partial weight bearing RLE Partial Weight Bearing Percentage or Pounds: 50 Other Position/Activity Restrictions: 50% General:   Vital Signs: Therapy Vitals Temp: 98.1 F (36.7 C) Temp src: Oral Pulse Rate: 62 Resp: 18 BP: 101/57 mmHg Patient Position, if appropriate: Sitting Oxygen Therapy SpO2: 94 % O2 Device: None (Room air) Pain: Pain Assessment Pain Assessment: No/denies pain Pain Score: 4  Pain Type: Surgical pain Pain Location: Hip Pain  Orientation: Right Pain Descriptors / Indicators: Aching Pain Frequency: Intermittent Pain Onset: Gradual Patients Stated Pain Goal: 3 Pain Intervention(s): Medication (See eMAR);Repositioned Multiple Pain Sites: No  See FIM for current functional status  Therapy/Group: Individual Therapy  Lurena Joiner B. Hyden Soley, PT, DPT [phone]   07/07/2013, 5:10 PM

## 2013-07-07 NOTE — Progress Notes (Signed)
Occupational Therapy Note  Patient Details  Name: Kim Nichols MRN: [id number] Date of Birth: May 04, 1929 Today's Date: 07/07/2013  Time: 9:30-10:30am ( ) Pt seen for 1:1 OT session with focus on bed mobility, functional mobility, transfers, standing endurance and adhering to precautions during ADL's. Pt in bed upon arrival, ready to bathe and dress, no pain reported Pt states she had a shower yesterday and would prefer a sink bath today. Pt bathed at sink in standing mostly, sitting down for LE's. Pt used 2WW around room, following WB precautions. Pt gathered clothing and dressed EOB, with max A for TED hose only. Pt used sock aide to donn socks. Pt completed oral hygiene and other grooming at sink and toileted at end of session. Pt's dtr, [name] is due to drive up from [facility] on Thursday and should be here Friday for family ed before d/c. Ended session with pt sitting in recliner with call bell in lap.   Rayssa Atha M Kion Huntsberry 07/07/2013, 12:12 PM

## 2013-07-08 ENCOUNTER — Inpatient Hospital Stay (HOSPITAL_COMMUNITY): Payer: Medicare Other

## 2013-07-08 LAB — GLUCOSE, CAPILLARY
Glucose-Capillary: 126 mg/dL — ABNORMAL HIGH (ref 70–99)
Glucose-Capillary: 129 mg/dL — ABNORMAL HIGH (ref 70–99)

## 2013-07-08 NOTE — Patient Care Conference (Signed)
Inpatient RehabilitationTeam Conference and Plan of Care Update Date: 07/07/2013   Time: 10:50 Am    Patient Name: Kim Nichols      Medical Record Number: [id number]  Date of Birth: 12-30-1929 Sex: Female         Room/Bed: 4145/4145-01 Payor Info: Payor: Advertising copywriter MEDICARE / Plan: AARP MEDICARE COMPLETE / Product Type: *No Product type* /    Admitting Diagnosis: Hip Enceph ORIF  Admit Date/Time:  06/25/2013  2:13 PM Admission Comments: No comment available   Primary Diagnosis:  Femur fracture, right Principal Problem: Femur fracture, right  Patient Active Problem List   Diagnosis Date Noted  . Type II or unspecified type diabetes mellitus without mention of complication, not stated as uncontrolled 07/06/2013  . Femur fracture, right 06/28/2013  . Acute encephalopathy 06/22/2013  . Delirium 06/22/2013  . DVT of leg (deep venous thrombosis) 06/22/2013  . HTN (hypertension) 06/21/2013  . Hyperlipidemia 06/21/2013  . Anemia 06/21/2013  . Pneumonia 06/21/2013  . Acute respiratory failure 06/21/2013    Expected Discharge Date: Expected Discharge Date: 07/09/13  Team Members Present: Physician leading conference: Dr. Claudette Laws Social Worker Present: Dossie Der, LCSW Nurse Present: Gregor Hams, RN PT Present: Edman Circle, PT;Becky Francee Gentile [place], PT OT Present: Leonette Monarch, Felipa Eth, OT;Other (comment) Dorathy Daft Perkinson_OT) SLP Present: Fae Pippin, SLP     Current Status/Progress Goal Weekly Team Focus  Medical   Infusion improving, shortness of breath but no drop in oxygen saturation, hip pain improved  Training family  Discharge preparations   Bowel/Bladder   Continent of B&B  Pt to remain continent of B&B  continue to monitor   Swallow/Nutrition/ Hydration   [facility]         ADL's   steadying-close supervision for standing during self-care tasks (assist depends on pain level), improving with AE, decreased activity tolerance   supervision   standing balance/tolerance, activity tolerance, functional transfers, energy conservation, and education    Mobility   supervision bed mobility, supervision transfers with RW, supervision gait with RW 110', 1 STE min guard assist, balance during functional dynamic task supervision.  On target to d/c Friday 7/11- will need family education scheduled that day with daughter who will be here around 2pm..  HHPT at discharge.  No DME needs at this time per chart- may need to talk with daughter from [facility] to see if transsport or WC needed for community access.    supervision overall  right leg strength, right leg ROM (especially knee and hip flexion), bed mobility, transfers, giat with RW, balance, stairs, safety with PWB status, STM.   Communication   WFL         Safety/Cognition/ Behavioral Observations  Mod I-Supervision   Mod I   cont and complete plan of care   Pain   Tylenol 325-650mg  PO Q4h PO for mild pain; Oxycodone IR 5mg  PO Q6h for severe pain  pain <4  assess and monitor pain qshift and prn   Skin   R hip incision with staples well approximated OTA; minimal edema  free from skin infection/breakdown  remain free from infection/skin breakdown      *See Care Plan and progress notes for long and short-term goals.  Barriers to Discharge: Daughter still in [place] driving back to day    Possible Resolutions to Barriers:  See above    Discharge Planning/Teaching Needs:  Home-daughter coming from [facility] to stay with her short time.        Team Discussion:  Daughter coming Friday to do family education and stay with at home.  Balance and cognition better.  D/c speech.  Off O2 sats much better-90's.  Ready for d/c friday  Revisions to Treatment Plan:  None   Continued Need for Acute Rehabilitation Level of Care: The patient requires daily medical management by a physician with specialized training in physical medicine and rehabilitation for the following conditions: Daily  direction of a multidisciplinary physical rehabilitation program to ensure safe treatment while eliciting the highest outcome that is of practical value to the patient.: Yes Daily medical management of patient stability for increased activity during participation in an intensive rehabilitation regime.: Yes Daily analysis of laboratory values and/or radiology reports with any subsequent need for medication adjustment of medical intervention for : Post surgical problems  Nadia Viar, Lemar Livings 07/08/2013, 8:45 AM

## 2013-07-08 NOTE — Discharge Summary (Signed)
Kim Nichols, Kim Nichols                ACCOUNT NO.:  [id number]  MEDICAL RECORD NO.:  [id number]  LOCATION:  4145                         FACILITY:  [facility]  PHYSICIAN:  Erick Colace, M.D.DATE OF BIRTH:  03-18-1929  DATE OF ADMISSION:  06/23/2013 DATE OF DISCHARGE:  07/09/2013                              DISCHARGE SUMMARY   DISCHARGE DIAGNOSES: 1. Right subcapital femur fracture with ORIF on 06/18/2013. 2. Deep vein thrombosis, right gastrocnemius vein, pain management. 3. Delirium, improved. 4. Acute blood loss anemia. 5. Hypertension. 6. History of Parkinson disease versus movement disorder. 7. Diet-controlled diabetes mellitus. 8. Pneumonia, resolved.  HISTORY OF PRESENT ILLNESS:  An 79 year old right-handed female with history of questionable Parkinson disease versus movement disorder, recently placed on Sinemet.  Kim Nichols was independent and active prior to admission as well as driving.  The patient with recent right subcapital femur fracture at [facility] and underwent ORIF on 06/18/2013.  Postoperatively, she developed acute encephalopathy with fever and received antibiotics for suspect pneumonia and urinary tract infection.  She was transferred to [facility] on 06/21/2013, for ongoing medical care.  Recent cranial CT scan at an outside hospital was reported to be negative.  Lower extremity Dopplers consistent with deep vein thrombosis involving the right gastrocnemius vein.  The patient was maintained on broad-spectrum antibiotics for pneumonia and urinary tract infection.  Echocardiogram with ejection fraction of 65% and normal systolic function.  Chest x-ray completed showing diffuse peribronchial thickening with patchy bilateral airspace opacities, questionable chronic lung disease versus acute infiltrates.  Placed on Xarelto secondary to deep vein thrombosis.  Noted delirium and confusion, felt to be multifactorial in nature and narcotics  were minimized.  Her Sinemet was discontinued secondary to possible causes of confusion.  Acute blood loss anemia, 8.0 and monitored.  Physical and occupational therapy ongoing.  The patient was partial weightbearing right lower extremity.  She was admitted for a comprehensive rehab program.  PAST MEDICAL HISTORY:  See discharge diagnoses.  SOCIAL HISTORY:  Lives alone.  FUNCTIONAL STATUS:  Upon admission to rehab services, moderate assist for mobility.  PHYSICAL EXAMINATION:  VITAL SIGNS:  Blood pressure 161/61, pulse 73, temperature 97, and respirations are 19. GENERAL:  This was an alert female, she was able to provide her name, place, date of birth, and age.  She follows simple commands.  She displayed fair insight to her deficits.  Hip incision was clean and dry. HEENT:  Pupils were round and reactive to light. LUNGS:  Clear to auscultation. CARDIAC:  Regular rate and rhythm. ABDOMEN:  Soft, nontender.  Good bowel sounds.  REHABILITATION HOSPITAL COURSE:  The patient was admitted to inpatient rehab services with therapies initiated on a 3-hour daily basis consisting of physical and occupational therapy.  The following issues were addressed during the patient's rehabilitation stay.  Pertaining to Ms. Kim Nichols's right subcapital femur fracture, she had undergone ORIF on 06/18/2013, at [facility].  She was partial weightbearing.  Postoperatively noted deep vein thrombosis, right gastrocnemius vein.  She was maintained on Xarelto.  Pain management monitored and minimized due to bouts of confusion and delirium, with good results continued to improve.  She was using  oxycodone as needed for breakthrough pain.  Acute blood loss anemia stable.  The patient was Kim Nichols.  She remained on iron supplement.  Her blood pressures were well-controlled on clonidine and Vasotec as well as Plendil with no orthostatic changes.  Her Sinemet had been discontinued.  She  had recently by report been placed on this for Parkinson disease versus movement disorder, discontinued due to contributing of confusion and monitored.  Her blood sugars remained well-controlled with diet- controlled diabetes mellitus.  Oxygen saturations were greater than 90% on room air with no shortness of breath.  The patient received weekly collaborative interdisciplinary team conferences to discuss estimated length of stay, family teaching, and any barriers to discharge.  She was continent of bowel and bladder, steady close supervision for self-care and functional tasks needing some rest breaks, minimum-to-moderate assist transfers, ambulating 30 feet minimal assist supervision bed mobility.  Her strength and endurance continued to improve.  Plan was to be discharged to home with daughter when family teaching completed and ongoing therapies dictated per Rehab services.  DISCHARGE MEDICATIONS:  Included: 1. Klonopin 1 mg p.o. at bedtime. 2. Clonidine 0.1 mg p.o. at bedtime. 3. Cymbalta 60 mg p.o. daily. 4. Vasotec 20 mg p.o. daily. 5. Plendil 5 mg p.o. daily. 6. Neurontin 300 mg p.o. t.i.d. 7. Niferex 150 mg p.o. daily. 8. Oxycodone immediate release 5 mg one tablet every 6 hours as needed     for pain. 9. Protonix 40 mg daily. 10.Xarelto 20 mg daily. 11.Zocor 20 mg p.o. daily.  DIET:  Her diet was a diabetic diet.  ACTIVITIES:  She was partial weightbearing to right lower extremity.  FOLLOWUP:  She would follow up with Dr. Donette Larry, medical management as well as Dr. Birdie Riddle, [facility].  Ongoing therapies were dictated as per Rehab services.     Kim Nichols, P.A.   ______________________________ Erick Colace, M.D.    DA/MEDQ  D:  07/08/2013  T:  07/08/2013  Job:  [id number]  cc:   Georgann Housekeeper, MD Birdie Riddle

## 2013-07-08 NOTE — Progress Notes (Signed)
Social Work Patient ID: Kim Nichols, female   DOB: Nov 27, 1929, 79 y.o.   MRN: [id number] Checked with Michelle-RN who reports pt on room air last night and kept in the 90's for O2 sats.  Pt does not qualify for home O2. Pt glad does not need it at home.  Feels since she has gotten stronger she doesn't need it.  Ready for discharge tomorrow.

## 2013-07-08 NOTE — Progress Notes (Signed)
Social Work Patient ID: Kim Nichols, female   DOB: [birth year], 79 y.o.   MRN: [id number] Met with pt to discuss team conference progression toward goals and discharge.  She wants to wait for daughter to discuss follow up therapy she may be going Back to [facility] with her in a couple weeks.  Daughter to be here tomorrow will do family education prior to discharge.  Pt aware home O2 not needed, sats too high. RN to have checked last night and charted, will check with RN today.

## 2013-07-08 NOTE — Progress Notes (Signed)
Physical Therapy Session Note  Patient Details  Name: Kim Nichols MRN: [id number] Date of Birth: January 28, 1929  Today's Date: 07/08/2013 Time: 0800-0900 Time Calculation (min): 60 min  Short Term Goals: Week 2:  PT Short Term Goal 1 (Week 2): STGs=LTGs  Skilled Therapeutic Interventions/Progress Updates:  Patient participated in grad day activities. Patient found in bed. Patient supine to sit with supervision. Patient ambulated 115 feet with RW and supervision maintaining PWB. Patient performed car transfer with supervision. Patient ambulated 75 feet with RW in home like setting (apartment) with supervision. Patient performed sit <> supine on regular double bed with supervision. Patient ambulated up and down curb with RW and close supervision to simulate home entrance. Patient ambulated around and over obstacles with RW and supervision. Patient stood at [employer] table and folded towels with supervision x 2 minutes. Patient ambulated 150 feet with RW and supervision. Patient performed 3/3 toileting tasks. Patient stood to wash hands at sink. Patient sit to supine with supervision.  Patient left in bed with all items in reach.  Therapy Documentation Precautions:  Precautions Precautions: Fall Precaution Comments: h/o fall Restrictions Weight Bearing Restrictions: Yes RLE Weight Bearing: Partial weight bearing RLE Partial Weight Bearing Percentage or Pounds: 50 Other Position/Activity Restrictions: 50%  Pain: Pain Assessment Pain Assessment: 0-10 Pain Score: 3  Pain Type: Acute pain Pain Location: Buttocks Pain Orientation: Mid Pain Descriptors / Indicators: Aching Pain Frequency: Intermittent Pain Onset: Gradual Patients Stated Pain Goal: 3 Pain Intervention(s): Medication (See eMAR);Repositioned Multiple Pain Sites: No Locomotion : Ambulation Ambulation/Gait Assistance: 5: Supervision    See FIM for current functional status  Therapy/Group: Individual Therapy  Arelia Longest M 07/08/2013, 9:18 AM

## 2013-07-08 NOTE — Progress Notes (Signed)
Patient ID: Kim Nichols, female   DOB: 16-May-1929, 79 y.o.   MRN: [id number] Subjective/Complaints: Breathing better, no O2 today, amb with walker SBA  Review of Systems  Constitutional: Negative for fever and chills.  Respiratory: Negative for cough.   Musculoskeletal: Positive for joint pain.  Neurological: Positive for focal weakness.  All other systems reviewed and are negative.    Objective: Vital Signs: Blood pressure 118/58, pulse 64, temperature 97.4 F (36.3 C), temperature source Oral, resp. rate 17, height 5\' 4"  (1.626 m), weight 64 kg (141 lb 1.5 oz), SpO2 93.00%. No results found. Results for orders placed during the hospital encounter of 06/25/13 (from the past 72 hour(s))  GLUCOSE, CAPILLARY     Status: Abnormal   Collection Time    07/05/13 12:08 PM      Result Value Range   Glucose-Capillary 168 (*) 70 - 99 mg/dL   Comment 1 Notify RN    GLUCOSE, CAPILLARY     Status: Abnormal   Collection Time    07/05/13  4:25 PM      Result Value Range   Glucose-Capillary 147 (*) 70 - 99 mg/dL  GLUCOSE, CAPILLARY     Status: Abnormal   Collection Time    07/05/13  9:16 PM      Result Value Range   Glucose-Capillary 147 (*) 70 - 99 mg/dL  GLUCOSE, CAPILLARY     Status: Abnormal   Collection Time    07/06/13  7:05 AM      Result Value Range   Glucose-Capillary 130 (*) 70 - 99 mg/dL   Comment 1 Notify RN    GLUCOSE, CAPILLARY     Status: Abnormal   Collection Time    07/06/13 11:23 AM      Result Value Range   Glucose-Capillary 151 (*) 70 - 99 mg/dL   Comment 1 Notify RN    GLUCOSE, CAPILLARY     Status: Abnormal   Collection Time    07/06/13  4:36 PM      Result Value Range   Glucose-Capillary 124 (*) 70 - 99 mg/dL   Comment 1 Notify RN    GLUCOSE, CAPILLARY     Status: Abnormal   Collection Time    07/06/13  8:51 PM      Result Value Range   Glucose-Capillary 126 (*) 70 - 99 mg/dL   Comment 1 Notify RN    GLUCOSE, CAPILLARY     Status: Abnormal   Collection  Time    07/07/13  7:25 AM      Result Value Range   Glucose-Capillary 118 (*) 70 - 99 mg/dL   Comment 1 Notify RN    GLUCOSE, CAPILLARY     Status: Abnormal   Collection Time    07/07/13 12:05 PM      Result Value Range   Glucose-Capillary 174 (*) 70 - 99 mg/dL   Comment 1 Notify RN    GLUCOSE, CAPILLARY     Status: Abnormal   Collection Time    07/07/13  4:58 PM      Result Value Range   Glucose-Capillary 114 (*) 70 - 99 mg/dL   Comment 1 Notify RN    GLUCOSE, CAPILLARY     Status: Abnormal   Collection Time    07/07/13  8:48 PM      Result Value Range   Glucose-Capillary 144 (*) 70 - 99 mg/dL   Comment 1 Notify RN    GLUCOSE, CAPILLARY  Status: Abnormal   Collection Time    07/08/13  7:24 AM      Result Value Range   Glucose-Capillary 126 (*) 70 - 99 mg/dL   Comment 1 Notify RN       HEENT: Poor dentition Cardio: RRR and No murmurs Resp: CTA B/L and Crackles at both bases GI: BS positive and Non-distended Extremity:  Pulses positive and No Edema Skin:   Wound C/D/I and Right hip with staples lateral aspect, no drainage, no ecchymosis Neuro: Alert/Oriented, Cranial Nerve II-XII normal, Normal Sensory, Abnormal Motor 5/5 bilateral deltoid, bicep, tricep, grip, 3 minus right hip flexor 4 minus right knee extensor for bilateral ankle dorsiflexor plantar flexor and left proximal and Tone:  Within Normal Limits Musc/Skel:  Swelling Right thigh and right calf General no acute distress Hip pain with reduced passive range of motion  Assessment/Plan: 1. Functional deficits secondary to right subcapital hip fracture with post operative pneumonia and DVT which require 3+ hours per day of interdisciplinary therapy in a comprehensive inpatient rehab setting. Physiatrist is providing close team supervision and 24 hour management of active medical problems listed below. Physiatrist and rehab team continue to assess barriers to discharge/monitor patient progress toward functional and  medical goals. Plan D/C 7/11 FIM: FIM - Bathing Bathing Steps Patient Completed: Chest;Right Arm;Left Arm;Abdomen;Front perineal area;Buttocks;Right upper leg;Left upper leg;Right lower leg (including foot);Left lower leg (including foot) Bathing: 4: Steadying assist  FIM - Upper Body Dressing/Undressing Upper body dressing/undressing steps patient completed: Thread/unthread right sleeve of pullover shirt/dresss;Thread/unthread left sleeve of pullover shirt/dress;Put head through opening of pull over shirt/dress;Pull shirt over trunk Upper body dressing/undressing: 5: Set-up assist to: Obtain clothing/put away FIM - Lower Body Dressing/Undressing Lower body dressing/undressing steps patient completed: Thread/unthread right pants leg;Thread/unthread left pants leg;Pull pants up/down;Don/Doff right sock;Don/Doff left sock Lower body dressing/undressing: 5: Set-up assist to: Don/Doff TED stocking  FIM - Toileting Toileting steps completed by patient: Performs perineal hygiene;Adjust clothing prior to toileting;Adjust clothing after toileting Toileting Assistive Devices: Grab bar or rail for support Toileting: 6: Assistive device: No helper  FIM - Diplomatic Services operational officer Devices: Elevated toilet seat;Grab bars;Walker Toilet Transfers: 5-To toilet/BSC: Supervision (verbal cues/safety issues);5-From toilet/BSC: Supervision (verbal cues/safety issues)  FIM - Banker Devices: Therapist, occupational: 5: Supine > Sit: Supervision (verbal cues/safety issues);5: Sit > Supine: Supervision (verbal cues/safety issues);5: Chair or W/C > Bed: Supervision (verbal cues/safety issues);5: Bed > Chair or W/C: Supervision (verbal cues/safety issues)  FIM - Locomotion: Wheelchair Distance: 150 Locomotion: Wheelchair: 0: Activity did not occur FIM - Locomotion: Ambulation Locomotion: Ambulation Assistive Devices: Designer, industrial/product Ambulation/Gait  Assistance: 5: Supervision Locomotion: Ambulation: 5: Travels 150 ft or more with supervision/safety issues  Comprehension Comprehension Mode: Auditory Comprehension: 6-Follows complex conversation/direction: With extra time/assistive device  Expression Expression Mode: Verbal Expression: 6-Expresses complex ideas: With extra time/assistive device  Social Interaction Social Interaction: 6-Interacts appropriately with others with medication or extra time (anti-anxiety, antidepressant).  Problem Solving Problem Solving: 5-Solves complex 90% of the time/cues < 10% of the time  Memory Memory: 6-More than reasonable amt of time  Medical Problem List and Plan:  1. Right subcapital femur fracture. Status post ORIF 06/18/2013 at [facility]. Partial weightbearing right lower extremity, Repeat Hip Xrays show good hardware position 2. DVT Prophylaxis/Anticoagulation: Continue Xarelto for deep vein thrombosis right gastrocnemius vein  3. Pain Management: Neurontin 200 mg 3 times daily, oxycodone immediate release 5 mg every 6 hours as needed. Monitor  mental status  4. Mood/delirium/confusion. Limit narcotics as much as possible. Recent cranial CT scan negative. Mental status continues to improve and she appeared fairly functional today. Continue Klonopin 1 mg each bedtime and Cymbalta 60 mg daily. Provide emotional support  5. Neuropsych: This patient is capable of making decisions on her own behalf.  6. Acute blood loss anemia. Followup CBC.Hgb slightly improved Patient is Jehovah witness---no blood products. Continue iron supplement  7. Hypertension. Clonidine patch 0.1 mg weekly, Vasotec 20 mg daily, Plendil 5 mg daily. Monitor with increased mobility  8. Parkinson's disease versus movement disorder. Sinemet discontinued secondary to possible contributor of confusion  9. Diet controlled diabetes mellitus. Check blood sugars a.c. and at bedtime  10. Pneumonia.resolved    LOS  (Days) 13 A FACE TO FACE EVALUATION WAS PERFORMED  Aileen Amore E 07/08/2013, 10:08 AM

## 2013-07-08 NOTE — Progress Notes (Signed)
Occupational Therapy Session Note  Patient Details  Name: Kim Nichols MRN: [id number] Date of Birth: 13-Mar-1929  Today's Date: 07/08/2013 Time: 0930-1030 and [phone] and 1330-1355 Time Calculation (min): 60 min and 43 min and 25 min   Short Term Goals: Week 2:  OT Short Term Goal 1 (Week 2): STGs=LTGs  Skilled Therapeutic Interventions/Progress Updates:    Session 1: Therapy session on ADL retraining focused on dynamic standing balance, functional transfers, activity tolerance, and safety awareness. Pt completed bathing, grooming, and dressing at supervision. Ambulated in room with RW at supervision level to retrieve clothing. Pt completed all self-care tasks with good safety, e.g. placed towel over wet spot on floor. Pt took rest breaks at appropriate times and demonstrated good carryover with AE. Pt followed WB precautions throughout therapy session.   Session 2: Therapy session focused on activity tolerance and safety awareness during community outing activity to this facilities gift shop. Pt ambulated throughout gift shop at supervision level and able to safely manage RW through small spaces. Pt demonstrated good safety with getting close to items and positioning body correctly prior to reaching out of BOS for item. Pt ambulated throughout therapy session for approx 25 min before requiring rest break and no sob noted. Pt reported really enjoying this activity and said it was similar to things her and her daughter would do prior to admission. Pt left sitting in recliner chair with all items in reach.   Session 3: Therapy session focused on functional transfers, dynamic standing balance, activity tolerance, and safety awareness. Pt received supine in bed upon arrival and completed supine>sit with supervision. Pt ambulated into bathroom with RW at supervision level and completed toileting task at Mod I. Pt ambulated out of bathroom and completed oral care and washing hands while standing. Required  rest break then asked to water plants in room. Pt safely ambulated with RW across room to water plants demonstrating good safety with getting close to dresser before reaching to pool. Discussed discharge with pt tomorrow and family education with daughter [name]. Pt fatigued at end of session and requested to return to bed. Completed sit>supine with supervision.   Therapy Documentation Precautions:  Precautions Precautions: Fall Precaution Comments: h/o fall Restrictions Weight Bearing Restrictions: Yes RLE Weight Bearing: Partial weight bearing RLE Partial Weight Bearing Percentage or Pounds: 50 Other Position/Activity Restrictions: 50% General:   Vital Signs:   Pain: Pt reported "little pain" in R hip during therapy sessions.    Other Treatments:    See FIM for current functional status  Therapy/Group: Individual Therapy  [name] 07/08/2013, 10:25 AM

## 2013-07-08 NOTE — Discharge Summary (Signed)
  Discharge summary job [id number]

## 2013-07-09 ENCOUNTER — Inpatient Hospital Stay (HOSPITAL_COMMUNITY): Payer: Medicare Other

## 2013-07-09 DIAGNOSIS — G92 Toxic encephalopathy: Secondary | ICD-10-CM

## 2013-07-09 DIAGNOSIS — G929 Unspecified toxic encephalopathy: Secondary | ICD-10-CM

## 2013-07-09 DIAGNOSIS — D62 Acute posthemorrhagic anemia: Secondary | ICD-10-CM

## 2013-07-09 DIAGNOSIS — S72009A Fracture of unspecified part of neck of unspecified femur, initial encounter for closed fracture: Secondary | ICD-10-CM

## 2013-07-09 DIAGNOSIS — I10 Essential (primary) hypertension: Secondary | ICD-10-CM

## 2013-07-09 LAB — GLUCOSE, CAPILLARY

## 2013-07-09 MED ORDER — POLYSACCHARIDE IRON COMPLEX 150 MG PO CAPS: 150.0000 mg | ORAL_CAPSULE | Freq: Every day | ORAL | Status: DC

## 2013-07-09 MED ORDER — GABAPENTIN 300 MG PO CAPS: 300.0000 mg | ORAL_CAPSULE | Freq: Three times a day (TID) | ORAL | Status: DC

## 2013-07-09 MED ORDER — CLONIDINE HCL 0.1 MG PO TABS: 0.1000 mg | ORAL_TABLET | Freq: Every day | ORAL | Status: AC

## 2013-07-09 MED ORDER — SIMVASTATIN 40 MG PO TABS: 40.0000 mg | ORAL_TABLET | Freq: Every day | ORAL | Status: AC

## 2013-07-09 MED ORDER — CLONAZEPAM 1 MG PO TABS: 1.0000 mg | ORAL_TABLET | Freq: Every day | ORAL | Status: AC

## 2013-07-09 MED ORDER — ADULT MULTIVITAMIN W/MINERALS CH: 1.0000 | ORAL_TABLET | Freq: Every day | ORAL | Status: DC

## 2013-07-09 MED ORDER — ENALAPRIL MALEATE 20 MG PO TABS: 20.0000 mg | ORAL_TABLET | Freq: Every day | ORAL | Status: AC

## 2013-07-09 MED ORDER — OXYCODONE HCL 5 MG PO TABS: 5.0000 mg | ORAL_TABLET | Freq: Four times a day (QID) | ORAL | Status: DC | PRN

## 2013-07-09 MED ORDER — FELODIPINE ER 5 MG PO TB24: 5.0000 mg | ORAL_TABLET | Freq: Every day | ORAL | Status: AC

## 2013-07-09 MED ORDER — ESOMEPRAZOLE MAGNESIUM 40 MG PO CPDR: 40.0000 mg | DELAYED_RELEASE_CAPSULE | Freq: Every day | ORAL | Status: DC

## 2013-07-09 MED ORDER — RIVAROXABAN 20 MG PO TABS: 20.0000 mg | ORAL_TABLET | Freq: Every day | ORAL | Status: DC

## 2013-07-09 MED ORDER — DULOXETINE HCL 60 MG PO CPEP: 60.0000 mg | ORAL_CAPSULE | Freq: Every day | ORAL | Status: DC

## 2013-07-09 NOTE — Progress Notes (Signed)
Pt discharged to home with family at 85. Discharge instructions given by Harvel Ricks, PA with verbal understanding. Belongings with family.

## 2013-07-09 NOTE — Progress Notes (Signed)
Occupational Therapy Discharge Summary  Patient Details  Name: Kim Nichols MRN: [id number] Date of Birth: 26-Nov-1929  Today's Date: 07/09/2013 Time:0830  - 0930 Time calculation (min): 60 min     Patient has met 11 of 11 long term goals due to improved activity tolerance, improved balance, postural control and improved awareness.  Patient to discharge at overall Supervision level.  Patient's care partner is independent to provide the necessary physical and cognitive assistance at discharge. Patient's daughter [name] will be staying with patient for 2 weeks following discharge. [name] participated in family training and educated on patient's progress and supervision level. [name] reported feeling very comfortable about discharge and was excited to see her mother doing so well.   Reasons goals not met: N/A  Recommendation:  Patient will benefit from ongoing skilled OT services in home health setting to continue to advance functional skills in the area of BADL.  Equipment: none as patient already has required equipment  Reasons for discharge: treatment goals met  Patient/family agrees with progress made and goals achieved: Yes  Skilled Therapeutic Intervention Therapy session focused on standing balance, activity tolerance, ADL retraining, and family training. Pt's daughter [name]) present for last 30 min of therapy session. Educated on supervision level and reported understanding. Daughter assisted with transfer to and from toilet. Educated on energy conservation techniques with pt. Pt completed self-care tasks at supervision level on this date. [name] and pt reported no questions or concerns about discharge at this time.   OT Discharge Precautions/Restrictions    General   Vital Signs Therapy Vitals Temp: 97.4 F (36.3 C) Temp src: Oral Pulse Rate: 58 Resp: 18 BP: 112/67 mmHg Patient Position, if appropriate: Lying Oxygen Therapy SpO2: 97 % O2 Device: None (Room  air) Pain Pain Assessment Pain Score: 3  Pain Type: Surgical pain Pain Location: Hip Pain Orientation: Right Pain Intervention(s): Medication (See eMAR) ADL ADL Eating: Independent Grooming: Minimal assistance Where Assessed-Grooming: Sitting at sink Upper Body Bathing: Minimal assistance Where Assessed-Upper Body Bathing: Wheelchair;Sitting at sink Lower Body Bathing: Moderate assistance Where Assessed-Lower Body Bathing: Standing at sink;Wheelchair Upper Body Dressing: Setup Where Assessed-Upper Body Dressing: Wheelchair Lower Body Dressing: Moderate assistance Where Assessed-Lower Body Dressing: Sitting at sink;Standing at sink;Wheelchair Toileting: Not assessed Toilet Transfer: Not assessed Toilet Transfer Method: Not assessed Tub/Shower Transfer: Not assessed Tub/Shower Transfer Method: Unable to assess Vision/Perception     Cognition   Sensation   Motor    Mobility     Trunk/Postural Assessment     Balance   Extremity/Trunk Assessment      See FIM for current functional status  [name] 07/09/2013, 8:51 AM

## 2013-07-09 NOTE — Progress Notes (Signed)
Social Work Discharge Note Discharge Note  The overall goal for the admission was met for:   Discharge location: Yes-HOME DAUGHTER HERE FROM [place] TO STAY WITH 2 WEEKS  Length of Stay: Yes-14 DAYS  Discharge activity level: Yes-SUPERVISION LEVEL  Home/community participation: Yes  Services provided included: MD, RD, PT, OT, SLP, RN, Pharmacy and SW  Financial Services: Private Insurance: [facility]  Follow-up services arranged: Home Health: [facility]-PT,RN,OT and Patient/Family has no preference for HH/DME agencies  Comments (or additional information):FAMILY EDUCATION COMPLETED TODAY-PT DID VERY WELL HERE  Patient/Family verbalized understanding of follow-up arrangements: Yes  Individual responsible for coordination of the follow-up plan: SELF & DEBRA-DAUGHTER  Confirmed correct DME delivered: Lucy Chris 07/09/2013    Lucy Chris

## 2013-07-09 NOTE — Progress Notes (Signed)
Patient ID: Kim Nichols, female   DOB: [birth year], 79 y.o.   MRN: [id number] Subjective/Complaints: Breathing normally, no O2 last noc Review of Systems  Constitutional: Negative for fever and chills.  Respiratory: Negative for cough.   Musculoskeletal: Positive for joint pain.  Neurological: Positive for focal weakness.  All other systems reviewed and are negative.    Objective: Vital Signs: Blood pressure 112/67, pulse 58, temperature 97.4 F (36.3 C), temperature source Oral, resp. rate 18, height 5\' 4"  (1.626 m), weight 64 kg (141 lb 1.5 oz), SpO2 97.00%. No results found. Results for orders placed during the hospital encounter of 06/25/13 (from the past 72 hour(s))  GLUCOSE, CAPILLARY     Status: Abnormal   Collection Time    07/06/13 11:23 AM      Result Value Range   Glucose-Capillary 151 (*) 70 - 99 mg/dL   Comment 1 Notify RN    GLUCOSE, CAPILLARY     Status: Abnormal   Collection Time    07/06/13  4:36 PM      Result Value Range   Glucose-Capillary 124 (*) 70 - 99 mg/dL   Comment 1 Notify RN    GLUCOSE, CAPILLARY     Status: Abnormal   Collection Time    07/06/13  8:51 PM      Result Value Range   Glucose-Capillary 126 (*) 70 - 99 mg/dL   Comment 1 Notify RN    GLUCOSE, CAPILLARY     Status: Abnormal   Collection Time    07/07/13  7:25 AM      Result Value Range   Glucose-Capillary 118 (*) 70 - 99 mg/dL   Comment 1 Notify RN    GLUCOSE, CAPILLARY     Status: Abnormal   Collection Time    07/07/13 12:05 PM      Result Value Range   Glucose-Capillary 174 (*) 70 - 99 mg/dL   Comment 1 Notify RN    GLUCOSE, CAPILLARY     Status: Abnormal   Collection Time    07/07/13  4:58 PM      Result Value Range   Glucose-Capillary 114 (*) 70 - 99 mg/dL   Comment 1 Notify RN    GLUCOSE, CAPILLARY     Status: Abnormal   Collection Time    07/07/13  8:48 PM      Result Value Range   Glucose-Capillary 144 (*) 70 - 99 mg/dL   Comment 1 Notify RN    GLUCOSE, CAPILLARY      Status: Abnormal   Collection Time    07/08/13  7:24 AM      Result Value Range   Glucose-Capillary 126 (*) 70 - 99 mg/dL   Comment 1 Notify RN    GLUCOSE, CAPILLARY     Status: Abnormal   Collection Time    07/08/13 12:06 PM      Result Value Range   Glucose-Capillary 111 (*) 70 - 99 mg/dL   Comment 1 Notify RN    GLUCOSE, CAPILLARY     Status: Abnormal   Collection Time    07/08/13  4:50 PM      Result Value Range   Glucose-Capillary 129 (*) 70 - 99 mg/dL   Comment 1 Notify RN    GLUCOSE, CAPILLARY     Status: Abnormal   Collection Time    07/08/13  8:55 PM      Result Value Range   Glucose-Capillary 298 (*) 70 - 99 mg/dL  GLUCOSE, CAPILLARY  Status: Abnormal   Collection Time    07/09/13  7:23 AM      Result Value Range   Glucose-Capillary 110 (*) 70 - 99 mg/dL   Comment 1 Notify RN       HEENT: Poor dentition Cardio: RRR and No murmurs Resp: CTA B/L and Crackles at both bases GI: BS positive and Non-distended Extremity:  Pulses positive and No Edema Skin:   Wound C/D/I and Right hip with staples lateral aspect, no drainage, no ecchymosis Neuro: Alert/Oriented, Cranial Nerve II-XII normal, Normal Sensory, Abnormal Motor 5/5 bilateral deltoid, bicep, tricep, grip, 3 minus right hip flexor 4 minus right knee extensor for bilateral ankle dorsiflexor plantar flexor and left proximal and Tone:  Within Normal Limits Musc/Skel:  Swelling Right thigh and right calf General no acute distress Hip pain with reduced passive range of motion  Assessment/Plan: 1. Functional deficits secondary to right subcapital hip fracture with post operative pneumonia and DVT which require 3+ hours per day of interdisciplinary therapy in a comprehensive inpatient rehab setting. Stable for D/C today F/u ortho in 1-2 weeks F/u PCP 3 weeks See D/C summary See D/C instructions FIM: FIM - Bathing Bathing Steps Patient Completed: Chest;Right Arm;Left Arm;Abdomen;Front perineal  area;Buttocks;Right upper leg;Left upper leg;Right lower leg (including foot);Left lower leg (including foot) Bathing: 5: Supervision: Safety issues/verbal cues  FIM - Upper Body Dressing/Undressing Upper body dressing/undressing steps patient completed: Thread/unthread right sleeve of pullover shirt/dresss;Thread/unthread left sleeve of pullover shirt/dress;Put head through opening of pull over shirt/dress;Pull shirt over trunk Upper body dressing/undressing: 5: Supervision: Safety issues/verbal cues FIM - Lower Body Dressing/Undressing Lower body dressing/undressing steps patient completed: Thread/unthread right pants leg;Thread/unthread left pants leg;Pull pants up/down;Don/Doff right sock;Don/Doff left sock;Fasten/unfasten pants;Pull underwear up/down;Thread/unthread left underwear leg;Thread/unthread right underwear leg Lower body dressing/undressing: 5: Set-up assist to: Don/Doff TED stocking  FIM - Toileting Toileting steps completed by patient: Performs perineal hygiene;Adjust clothing prior to toileting;Adjust clothing after toileting Toileting Assistive Devices: Grab bar or rail for support Toileting: 6: Assistive device: No helper  FIM - Diplomatic Services operational officer Devices: Elevated toilet seat;Grab bars;Walker Toilet Transfers: 5-To toilet/BSC: Supervision (verbal cues/safety issues);5-From toilet/BSC: Supervision (verbal cues/safety issues)  FIM - Banker Devices: Therapist, occupational: 5: Supine > Sit: Supervision (verbal cues/safety issues);5: Sit > Supine: Supervision (verbal cues/safety issues);5: Chair or W/C > Bed: Supervision (verbal cues/safety issues);5: Bed > Chair or W/C: Supervision (verbal cues/safety issues)  FIM - Locomotion: Wheelchair Distance: 150 Locomotion: Wheelchair: 0: Activity did not occur FIM - Locomotion: Ambulation Locomotion: Ambulation Assistive Devices: Designer, industrial/product Ambulation/Gait  Assistance: 5: Supervision Locomotion: Ambulation: 5: Travels 150 ft or more with supervision/safety issues  Comprehension Comprehension Mode: Auditory Comprehension: 6-Follows complex conversation/direction: With extra time/assistive device  Expression Expression Mode: Verbal Expression: 6-Expresses complex ideas: With extra time/assistive device  Social Interaction Social Interaction: 6-Interacts appropriately with others with medication or extra time (anti-anxiety, antidepressant).  Problem Solving Problem Solving: 5-Solves complex 90% of the time/cues < 10% of the time  Memory Memory: 6-More than reasonable amt of time  Medical Problem List and Plan:  1. Right subcapital femur fracture. Status post ORIF 06/18/2013 at [facility]. Partial weightbearing right lower extremity, Repeat Hip Xrays show good hardware position 2. DVT Prophylaxis/Anticoagulation: Continue Xarelto for deep vein thrombosis right gastrocnemius vein  3. Pain Management: Neurontin 200 mg 3 times daily, oxycodone immediate release 5 mg every 6 hours as needed. Monitor mental status  4. Mood/delirium/confusion. Limit narcotics as  much as possible. Recent cranial CT scan negative. Mental status continues to improve and she appeared fairly functional today. Continue Klonopin 1 mg each bedtime and Cymbalta 60 mg daily. Provide emotional support  5. Neuropsych: This patient is capable of making decisions on her own behalf.  6. Acute blood loss anemia. Followup CBC.Hgb slightly improved Patient is Jehovah witness---no blood products. Continue iron supplement  7. Hypertension. Clonidine patch 0.1 mg weekly, Vasotec 20 mg daily, Plendil 5 mg daily. Monitor with increased mobility  8. Parkinson's disease versus movement disorder. Sinemet discontinued secondary to possible contributor of confusion, f/u with Neuro as outpt if symptoms erturn  9. Diet controlled diabetes mellitus. Check blood sugars a.c. and at  bedtime  10. Pneumonia.resolved    LOS (Days) 14 A FACE TO FACE EVALUATION WAS PERFORMED  KIRSTEINS,ANDREW E 07/09/2013, 9:16 AM

## 2013-07-10 ENCOUNTER — Telehealth: Payer: Self-pay | Admitting: Internal Medicine

## 2013-07-10 NOTE — Telephone Encounter (Signed)
Pt called re: Xarelto being $300/month - Pharmacy said she needs it to be pre-approved with her insurance  The pt will buy 5 days worth of medicine today and will contact Dr Rene Paci (her PCP) on Monday re: further plans. She is in agreement.

## 2013-07-12 ENCOUNTER — Telehealth: Payer: Self-pay

## 2013-07-12 NOTE — Telephone Encounter (Signed)
Patient says [facility] is not covered by insurance.  She will have pharmacy fax prior auth request.  Informed patient to follow up with primary care giver.

## 2013-07-14 ENCOUNTER — Telehealth: Payer: Self-pay

## 2013-07-14 NOTE — Telephone Encounter (Signed)
Patient needs prior authorization for xarelto.  Have you received this request?  She paid out of pocket for the medication and only has a pill for today.  Is there an alternative?

## 2013-07-15 NOTE — Telephone Encounter (Signed)
Xarelto was approved.  Left message informing patient.

## 2013-07-16 NOTE — Progress Notes (Signed)
Physical Therapy Discharge Summary  Patient Details  Name: Kim Nichols MRN: [id number] Date of Birth: [birth year]  Today's Date: 07/16/2013 Time:  -     Patient has met 8 of 8 long term goals due to improved activity tolerance, improved balance, increased strength, increased range of motion and decreased pain.  Patient to discharge at an ambulatory level Supervision.   Patient's care partner is independent to provide the necessary physical assistance at discharge.  Reasons goals not met: n/a patient met all goals  Recommendation:  Patient will benefit from ongoing skilled PT services in home health setting to continue to advance safe functional mobility, address ongoing impairments in dependence with mobility, and minimize fall risk.  Equipment: No equipment provided; patient has all needed equipment  Reasons for discharge: treatment goals met and discharge from hospital  Patient/family agrees with progress made and goals achieved: Yes  PT Discharge  See FIM for current functional status  Alma Friendly 07/16/2013, 11:08 AM

## 2013-07-19 NOTE — Telephone Encounter (Signed)
ok 

## 2013-09-20 ENCOUNTER — Other Ambulatory Visit: Payer: Self-pay | Admitting: Physical Medicine & Rehabilitation

## 2013-10-04 ENCOUNTER — Other Ambulatory Visit: Payer: Self-pay | Admitting: Physical Medicine & Rehabilitation

## 2013-12-01 ENCOUNTER — Other Ambulatory Visit: Payer: Self-pay | Admitting: Internal Medicine

## 2013-12-01 DIAGNOSIS — R1310 Dysphagia, unspecified: Secondary | ICD-10-CM

## 2013-12-06 ENCOUNTER — Ambulatory Visit
Admission: RE | Admit: 2013-12-06 | Discharge: 2013-12-06 | Disposition: A | Discharge status: Home / Self Care | Payer: Medicare Other | Source: Ambulatory Visit | Attending: Internal Medicine | Admitting: Internal Medicine

## 2013-12-06 DIAGNOSIS — R1310 Dysphagia, unspecified: Secondary | ICD-10-CM

## 2013-12-08 ENCOUNTER — Other Ambulatory Visit (HOSPITAL_COMMUNITY): Payer: Self-pay | Admitting: Internal Medicine

## 2013-12-08 DIAGNOSIS — R1310 Dysphagia, unspecified: Secondary | ICD-10-CM

## 2013-12-14 ENCOUNTER — Ambulatory Visit (HOSPITAL_COMMUNITY)
Admission: RE | Admit: 2013-12-14 | Discharge: 2013-12-14 | Disposition: A | Discharge status: Home / Self Care | Payer: Medicare Other | Source: Ambulatory Visit | Attending: Internal Medicine | Admitting: Internal Medicine

## 2013-12-14 DIAGNOSIS — R1313 Dysphagia, pharyngeal phase: Secondary | ICD-10-CM | POA: Insufficient documentation

## 2013-12-14 DIAGNOSIS — E119 Type 2 diabetes mellitus without complications: Secondary | ICD-10-CM | POA: Insufficient documentation

## 2013-12-14 DIAGNOSIS — R1310 Dysphagia, unspecified: Secondary | ICD-10-CM

## 2013-12-14 DIAGNOSIS — I10 Essential (primary) hypertension: Secondary | ICD-10-CM | POA: Insufficient documentation

## 2013-12-14 NOTE — Procedures (Signed)
Objective Swallowing Evaluation: Modified Barium Swallowing Study  Patient Details  Name: Kim Nichols MRN: [id number] Date of Birth: [birth year]  Today's Date: 12/14/2013 Time: 1100-1145 SLP Time Calculation (min): 45 min  Past Medical History:  Past Medical History  Diagnosis Date  . Hypertension   . Diabetes mellitus without complication   . Dyslipidemia   . History of gastroesophageal reflux (GERD)   . Osteoarthritis   . Anxiety disorder   . Chronic insomnia   . Incontinence of urine    Past Surgical History:  Past Surgical History  Procedure Laterality Date  . Knee surgery  4-11   HPI:  79 yr old pt. accompanied by friend, referred for outpatient MBS after aspirating during upper GI study.  PMH:  reflux, HTN, DM, DVT, cervical spondylosis, knee surgery 2014, pna.  Pt. states she does get strangled with water sometimes.  Upper UGI revealed Frank tracheal aspiration due to poor/limited epiglottic inversion, Cervical and lumbar spondylosis, Bony demineralization, No definite additional abnormality.      Assessment / Plan / Recommendation Clinical Impression  Dysphagia Diagnosis: Mild pharyngeal phase dysphagia Clinical impression: Pt. exhibited mild pharyngeal dysphagia with sensorimotor (motor>sensory) impairments with intermittently delayed initiation of swallow to valleculae.  Decreased epiglottic inversion resulted in laryngeal penetration (both transient and remained in vestibule) into vestibule and reduced tongue base retraction.  Decreased laryngeal elevation led to mild residue in valleculae and pyriform sinuses.  Chin tuck not effective, however a breath hold technique consistently prevented penetration during thin sturdy (hold breath, take sip liquid and swallow).  Recommend regular texture diet and thin liquid, hold breath prior to swallowing thin liquid, pills whole in applesauce, swallow twice after bites sips to clear pharyngeal residue.    Treatment Recommendation  No treatment recommended at this time    Diet Recommendation Regular;Thin liquid   Liquid Administration via: Cup;Straw Medication Administration: Whole meds with puree Supervision: Patient able to self feed Compensations: Slow rate;Small sips/bites;Multiple dry swallows after each bite/sip;Follow solids with liquid (breath hold prior to swallow liquids) Postural Changes and/or Swallow Maneuvers: Seated upright 90 degrees;Upright 30-60 min after meal    Other  Recommendations Oral Care Recommendations: Oral care BID   Follow Up Recommendations  None    Frequency and Duration        Pertinent Vitals/Pain WDL         Reason for Referral Objectively evaluate swallowing function   Oral Phase Oral Preparation/Oral Phase Oral Phase: WFL   Pharyngeal Phase Pharyngeal Phase Pharyngeal Phase: Impaired Pharyngeal - Honey Pharyngeal - Honey Cup: Delayed swallow initiation;Premature spillage to valleculae;Pharyngeal residue - valleculae;Pharyngeal residue - pyriform sinuses;Reduced laryngeal elevation;Reduced tongue base retraction Pharyngeal - Nectar Pharyngeal - Nectar Teaspoon: Pharyngeal residue - valleculae;Pharyngeal residue - pyriform sinuses;Reduced laryngeal elevation;Reduced tongue base retraction;Delayed swallow initiation;Premature spillage to valleculae Pharyngeal - Nectar Cup: Premature spillage to valleculae;Delayed swallow initiation;Pharyngeal residue - valleculae;Pharyngeal residue - pyriform sinuses;Reduced tongue base retraction;Reduced laryngeal elevation Pharyngeal - Thin Pharyngeal - Thin Teaspoon: Delayed swallow initiation;Premature spillage to valleculae;Pharyngeal residue - pyriform sinuses;Pharyngeal residue - valleculae;Reduced tongue base retraction;Reduced laryngeal elevation;Penetration/Aspiration during swallow Penetration/Aspiration details (thin teaspoon): Material enters airway, remains ABOVE vocal cords then ejected out;Material enters airway, remains ABOVE  vocal cords and not ejected out Pharyngeal - Thin Cup: Pharyngeal residue - valleculae;Pharyngeal residue - pyriform sinuses;Reduced tongue base retraction;Reduced laryngeal elevation;Delayed swallow initiation;Premature spillage to valleculae;Penetration/Aspiration during swallow (breath hold effective) Penetration/Aspiration details (thin cup): Material enters airway, remains ABOVE vocal cords then ejected out;Material enters airway, remains ABOVE  vocal cords and not ejected out (penetrated close to cords) Pharyngeal - Thin Straw: Pharyngeal residue - valleculae;Pharyngeal residue - pyriform sinuses;Reduced tongue base retraction;Reduced laryngeal elevation;Delayed swallow initiation;Premature spillage to valleculae Pharyngeal - Solids Pharyngeal - Puree: Delayed swallow initiation;Premature spillage to valleculae  Cervical Esophageal Phase    GO    Cervical Esophageal Phase Cervical Esophageal Phase: [facility]    Functional Assessment Tool Used:  (clinical judgement) Functional Limitations: Swallowing Swallow Current Status ([id number]): At least 40 percent but less than 60 percent impaired, limited or restricted Swallow Goal Status [phone]): At least 40 percent but less than 60 percent impaired, limited or restricted Swallow Discharge Status [phone]): At least 40 percent but less than 60 percent impaired, limited or restricted    Royce Macadamia M.Ed [employer] [phone]  12/14/2013

## 2013-12-20 ENCOUNTER — Other Ambulatory Visit: Payer: Self-pay | Admitting: Physical Medicine & Rehabilitation

## 2013-12-22 ENCOUNTER — Other Ambulatory Visit: Payer: Self-pay | Admitting: Physical Medicine & Rehabilitation

## 2013-12-27 ENCOUNTER — Other Ambulatory Visit: Payer: Self-pay | Admitting: Physical Medicine & Rehabilitation

## 2014-01-26 ENCOUNTER — Other Ambulatory Visit: Payer: Self-pay | Admitting: Internal Medicine

## 2014-01-26 ENCOUNTER — Ambulatory Visit
Admission: RE | Admit: 2014-01-26 | Discharge: 2014-01-26 | Disposition: A | Discharge status: Home / Self Care | Payer: Medicare Other | Source: Ambulatory Visit | Attending: Internal Medicine | Admitting: Internal Medicine

## 2014-01-26 DIAGNOSIS — M79604 Pain in right leg: Secondary | ICD-10-CM

## 2014-09-16 ENCOUNTER — Other Ambulatory Visit: Payer: Self-pay | Admitting: Physical Medicine & Rehabilitation

## 2014-09-18 ENCOUNTER — Other Ambulatory Visit: Payer: Self-pay | Admitting: Physical Medicine & Rehabilitation

## 2014-09-21 ENCOUNTER — Other Ambulatory Visit: Payer: Self-pay | Admitting: Physical Medicine & Rehabilitation

## 2015-03-08 ENCOUNTER — Ambulatory Visit (INDEPENDENT_AMBULATORY_CARE_PROVIDER_SITE_OTHER): Payer: Medicare Other | Admitting: Podiatry

## 2015-03-08 ENCOUNTER — Ambulatory Visit (INDEPENDENT_AMBULATORY_CARE_PROVIDER_SITE_OTHER): Payer: Medicare Other

## 2015-03-08 ENCOUNTER — Encounter: Payer: Self-pay | Admitting: Podiatry

## 2015-03-08 VITALS — BP 122/70 | HR 61 | Resp 18

## 2015-03-08 DIAGNOSIS — M2010 Hallux valgus (acquired), unspecified foot: Secondary | ICD-10-CM | POA: Diagnosis not present

## 2015-03-08 DIAGNOSIS — Q828 Other specified congenital malformations of skin: Secondary | ICD-10-CM | POA: Diagnosis not present

## 2015-03-08 DIAGNOSIS — R52 Pain, unspecified: Secondary | ICD-10-CM

## 2015-03-08 NOTE — Progress Notes (Signed)
   Subjective:    Patient ID: Kim MarseillesMable Schlicker, female    DOB: 02-02-1929, 81 y.o.   MRN: [id number]  HPI  81 year old female presents the office they with complaints of a painful corn on the side of her second toe. She states that her big toe leans over abutting the second digit. She states that she. He has a corn there was no falloff however it reoccurs. She says on the corn gets thick as it is today becomes painful. She denies any drainage or redness around the site. Denies any history of injury or trauma. No other complaints at this time.  She is diabetic since 2014 and she denies any history of ulceration, tingling/numbness or any intermittent claudication symptoms.    Review of Systems  Hematological:       Slow to heal  Psychiatric/Behavioral: The patient is nervous/anxious.   All other systems reviewed and are negative.      Objective:   Physical Exam AAO 3, NAD DP/PT pulses palpable, CRT less than 3 seconds Protective sensation intact with Simms Weinstein monofilament, vibratory sensation intact, Achilles tendon reflex intact. 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 On the left side the hallux is about the second digit. There is no crossover toe deformity. There is a hyperkeratotic lesion on the medial aspect of the second digit along the IPJ. This corresponds to the area of pressure. Upon debridement of the hyperkeratotic lesion there is no underlying ulceration however the site is pre-ulcerative. There is no surrounding erythema, drainage or other clinical signs of infection. Prior to debridement of the corn there was tenderness to palpation directly overlying the area. After debridement the symptoms are resolved. No other areas of tenderness to bilateral lower extremities. No overlying edema, erythema, increase in warmth bilaterally. MMT 5/5, ROM WNL No open lesions or other pre-ulcerative lesions No  pain with calf compression, swelling, warmth, erythema.     Assessment & Plan:  81 year old female with symptomatic corn left second digit -X-rays were obtained and reviewed the patient -Treatment options discussed including alternatives, risks, complications -Hyperkeratotic lesion sharply debrided without complications/bleeding. -Dispensed various offloading pads to help protect the area. Discussion gear modifications to include a wider shoe to avoid pressure -Discussed daily foot inspection -Follow-up in 3 months or sooner if any problems are to arise. In the meantime encouraged to call the office with any questions, concerns, change in symptoms.

## 2015-03-08 NOTE — Patient Instructions (Signed)
Diabetes and Foot Care Diabetes may cause you to have problems because of poor blood supply (circulation) to your feet and legs. This may cause the skin on your feet to become thinner, break easier, and heal more slowly. Your skin may become dry, and the skin may peel and crack. You may also have nerve damage in your legs and feet causing decreased feeling in them. You may not notice minor injuries to your feet that could lead to infections or more serious problems. Taking care of your feet is one of the most important things you can do for yourself.  HOME CARE INSTRUCTIONS  Wear shoes at all times, even in the house. Do not go barefoot. Bare feet are easily injured.  Check your feet daily for blisters, cuts, and redness. If you cannot see the bottom of your feet, use a mirror or ask someone for help.  Wash your feet with warm water (do not use hot water) and mild soap. Then pat your feet and the areas between your toes until they are completely dry. Do not soak your feet as this can dry your skin.  Apply a moisturizing lotion or petroleum jelly (that does not contain alcohol and is unscented) to the skin on your feet and to dry, brittle toenails. Do not apply lotion between your toes.  Trim your toenails straight across. Do not dig under them or around the cuticle. File the edges of your nails with an emery board or nail file.  Do not cut corns or calluses or try to remove them with medicine.  Wear clean socks or stockings every day. Make sure they are not too tight. Do not wear knee-high stockings since they may decrease blood flow to your legs.  Wear shoes that fit properly and have enough cushioning. To break in new shoes, wear them for just a few hours a day. This prevents you from injuring your feet. Always look in your shoes before you put them on to be sure there are no objects inside.  Do not cross your legs. This may decrease the blood flow to your feet.  If you find a minor scrape,  cut, or break in the skin on your feet, keep it and the skin around it clean and dry. These areas may be cleansed with mild soap and water. Do not cleanse the area with peroxide, alcohol, or iodine.  When you remove an adhesive bandage, be sure not to damage the skin around it.  If you have a wound, look at it several times a day to make sure it is healing.  Do not use heating pads or hot water bottles. They may burn your skin. If you have lost feeling in your feet or legs, you may not know it is happening until it is too late.  Make sure your health care provider performs a complete foot exam at least annually or more often if you have foot problems. Report any cuts, sores, or bruises to your health care provider immediately. SEEK MEDICAL CARE IF:   You have an injury that is not healing.  You have cuts or breaks in the skin.  You have an ingrown nail.  You notice redness on your legs or feet.  You feel burning or tingling in your legs or feet.  You have pain or cramps in your legs and feet.  Your legs or feet are numb.  Your feet always feel cold. SEEK IMMEDIATE MEDICAL CARE IF:   There is increasing redness,   swelling, or pain in or around a wound.  There is a red line that goes up your leg.  Pus is coming from a wound.  You develop a fever or as directed by your health care provider.  You notice a bad smell coming from an ulcer or wound. Document Released: 12/13/2000 Document Revised: 08/18/2013 Document Reviewed: 05/25/2013 Exit[employer] Patient Information 2015 [employer]. This information is not intended to replace advice given to you by your health care provider. Make sure you discuss any questions you have with your health care provider.  

## 2015-06-30 ENCOUNTER — Ambulatory Visit: Payer: Medicare Other

## 2015-08-15 ENCOUNTER — Other Ambulatory Visit (HOSPITAL_COMMUNITY): Payer: Self-pay | Admitting: Interventional Radiology

## 2015-08-15 DIAGNOSIS — IMO0002 Reserved for concepts with insufficient information to code with codable children: Secondary | ICD-10-CM

## 2015-08-15 DIAGNOSIS — M549 Dorsalgia, unspecified: Secondary | ICD-10-CM

## 2015-08-25 ENCOUNTER — Ambulatory Visit (HOSPITAL_COMMUNITY)
Admission: RE | Admit: 2015-08-25 | Discharge: 2015-08-25 | Disposition: A | Discharge status: Home / Self Care | Payer: Medicare Other | Source: Ambulatory Visit | Attending: Interventional Radiology | Admitting: Interventional Radiology

## 2015-08-25 DIAGNOSIS — M549 Dorsalgia, unspecified: Secondary | ICD-10-CM

## 2015-08-25 DIAGNOSIS — IMO0002 Reserved for concepts with insufficient information to code with codable children: Secondary | ICD-10-CM

## 2015-10-31 ENCOUNTER — Telehealth (HOSPITAL_COMMUNITY): Payer: Self-pay | Admitting: Interventional Radiology

## 2015-10-31 NOTE — Telephone Encounter (Signed)
Called pt to see how she was doing. Pt states that she is "a little better" but still in pain.  She is seeing Dr. Eula ListenHussain soon and has decided to just call me back after that visit to discuss further. JM

## 2016-02-16 ENCOUNTER — Encounter: Payer: Self-pay | Admitting: Cardiology

## 2016-02-16 ENCOUNTER — Ambulatory Visit (INDEPENDENT_AMBULATORY_CARE_PROVIDER_SITE_OTHER): Payer: Medicare Other | Admitting: Internal Medicine

## 2016-02-16 ENCOUNTER — Encounter: Payer: Self-pay | Admitting: *Deleted

## 2016-02-16 VITALS — BP 109/60 | HR 72 | Ht 64.0 in | Wt 130.0 lb

## 2016-02-16 DIAGNOSIS — Z01818 Encounter for other preprocedural examination: Secondary | ICD-10-CM

## 2016-02-16 NOTE — Patient Instructions (Signed)
Medication Instructions:   Your physician recommends that you continue on your current medications as directed. Please refer to the Current Medication list given to you today.   If you need a refill on your cardiac medications before your next appointment, please call your pharmacy.  Labwork: NONE ORDER TODAY   Testing/Procedures:  NONE ORDER TODAY    Follow-Up:  AS NEEDED FOR  ANY CARDIAC RELATED SYMPTOMS   Any Other Special Instructions Will Be Listed Below (If Applicable).                                                                                                                                                   

## 2016-02-16 NOTE — Progress Notes (Signed)
02/16/2016 Tejah Cuffe   Feb 03, 1929  [id number]  Primary Physician Georgann Housekeeper, MD Primary Cardiologist: New (Dr. Tenny Craw, DOD)   Reason for Visit/CC: Cardiac Clearance for Surgery.  HPI:  82 y/o female, new to our practice, who presents to clinic for surgical clearance. She has been referred by Dr. Charlann Boxer of [facility]. She is tentatively scheduled to undergo right total knee replacement in the near future. She has no h/o CAD. Her PMH is significant for treated HTN, HLD and DM. She also has a remote h/o DVT, for which she takes Xarelto. Per records, she had a 2D echo in 2014 that showed normal LVEF of 60-65%. There was aortic sclerosis w/o stenosis. Trivial AI, MR and TR was noted. She also notes that she fractured her right hip in 2015 and underwent surgery w/o any complications.   She denies any significant CP. She notes occasional sharp shooting pain that occurs every now and then but only last for a few seconds. She is fairly independent for her age. She ambulates w/o use of any assistive devices. She continues to perform basic house hold chores on her own. Just this week, she vacuumed and mopped her entire house without any symptoms of chest tightness/ pressure or dyspnea. She denies any h/o syncope. Her EKG today demonstrates NSR with LVH. No prior EKGs for comparison. BP is well controlled at 109/60. Physical exam is unremarkable.   Current Outpatient Prescriptions  Medication Sig Dispense Refill  . acetaminophen (TYLENOL) 325 MG tablet Take 2 tablets (650 mg total) by mouth every 6 (six) hours as needed. 30 tablet 0  . alendronate (FOSAMAX) 70 MG tablet Take 70 mg by mouth once a week.     . Calcium Carbonate-Vitamin D (CALCIUM 600 + D PO) Take 600 mg by mouth once. Take 2 tabs once a day    . clonazePAM (KLONOPIN) 1 MG tablet Take 1 tablet (1 mg total) by mouth at bedtime. 30 tablet 1  . cloNIDine (CATAPRES) 0.1 MG tablet Take 1 tablet (0.1 mg total) by mouth at bedtime. 60  tablet 11  . DULoxetine (CYMBALTA) 60 MG capsule Take 1 capsule (60 mg total) by mouth daily. 30 capsule 3  . enalapril (VASOTEC) 20 MG tablet Take 1 tablet (20 mg total) by mouth daily. 30 tablet 1  . esomeprazole (NEXIUM) 40 MG capsule Take 1 capsule (40 mg total) by mouth daily before breakfast. 30 capsule 1  . felodipine (PLENDIL) 5 MG 24 hr tablet Take 1 tablet (5 mg total) by mouth daily. 30 tablet 1  . gabapentin (NEURONTIN) 300 MG capsule TAKE ONE CAPSULE BY MOUTH THREE TIMES A DAY 90 capsule 0  . iron polysaccharides (NIFEREX) 150 MG capsule Take 1 capsule (150 mg total) by mouth daily. 30 capsule 1  . metFORMIN (GLUCOPHAGE) 500 MG tablet Take 500 mg by mouth 2 (two) times daily with a meal.     . Multiple Vitamin (MULTIVITAMIN WITH MINERALS) TABS Take 1 tablet by mouth daily. Vision Formula with Lutein    . ONETOUCH DELICA LANCETS 33G MISC     . ONETOUCH VERIO test strip     . oxyCODONE (OXY IR/ROXICODONE) 5 MG immediate release tablet Take 1 tablet (5 mg total) by mouth every 6 (six) hours as needed. 60 tablet 0  . Rivaroxaban (XARELTO) 20 MG TABS Take 1 tablet (20 mg total) by mouth daily with supper. 30 tablet 1  . senna-docusate (SENOKOT-S) 8.6-50 MG per tablet Take 1 tablet by mouth at bedtime.  30 tablet 0  . simvastatin (ZOCOR) 40 MG tablet Take 1 tablet (40 mg total) by mouth at bedtime. 30 tablet 1  . MULTIPLE VITAMINS-MINERALS ER PO Take 2 tablets by mouth daily.      No current facility-administered medications for this visit.    Allergies  Allergen Reactions  . Naproxen Itching and Other (See Comments)    Decreased blood pressure  . Aleve [Naproxen Sodium] Itching    Social History   Social History  . Marital Status: Widowed    Spouse Name: N/A  . Number of Children: N/A  . Years of Education: N/A   Occupational History  . Not on file.   Social History Main Topics  . Smoking status: Never Smoker   . Smokeless tobacco: Never Used  . Alcohol Use: No  .  Drug Use: No  . Sexual Activity: Not on file   Other Topics Concern  . Not on file   Social History Narrative     Review of Systems: General: negative for chills, fever, night sweats or weight changes.  Cardiovascular: negative for chest pain, dyspnea on exertion, edema, orthopnea, palpitations, paroxysmal nocturnal dyspnea or shortness of breath Dermatological: negative for rash Respiratory: negative for cough or wheezing Urologic: negative for hematuria Abdominal: negative for nausea, vomiting, diarrhea, bright red blood per rectum, melena, or hematemesis Neurologic: negative for visual changes, syncope, or dizziness All other systems reviewed and are otherwise negative except as noted above.    Blood pressure 109/60, pulse 72, height  (1.626 m), weight 130 lb (58.968 kg).  General appearance: alert, cooperative, no distress and frail appearing Neck: no carotid bruit and no JVD Lungs: clear to auscultation bilaterally Heart: regular rate and rhythm, S1, S2 normal, no murmur, click, rub or gallop Extremities: no LEE Pulses: 2+ and symmetric Skin: warm and dry Neurologic: Grossly normal  EKG NSR with LVH. No prior EKGs for comparison.   ASSESSMENT AND PLAN:   1. Surgical Clearance: Patient has no h/o CAD. EKG is normal. 2D echo in 2014 showed normal LVEF and no significant valve abnormalities. Her physical exam is unremarkable. Vital signs are stable. She is able to engage in physical activity w/o any exertional chest discomfort or dyspnea. She is low risk from a cardiac standpoint for knee surgery. She has also been seen and examined by Dr. Tenny Craw who agrees that she is low risk and can proceed with surgery. We will notify the office of Dr. Charlann Boxer that she has been cleared. She can f/u as needed.    SIMMONS, BRITTAINY PA-C 02/16/2016 3:24 PM Pt seen ande examined  Presents for cardiac risk stratification pre surgery Denies CP  Pt is active vacuuming and cleaning house  On  exam, Lungs CTA  Card:  RRR  No S3  Ext without edema EKG with SR  OVerall I think pt is at relatively low risk for major cardiac event with planned surgery  OK to proceed without further testing Will be available as needed.    Dietrich Pates

## 2016-03-21 ENCOUNTER — Encounter (HOSPITAL_COMMUNITY): Payer: Self-pay

## 2016-03-21 ENCOUNTER — Encounter (HOSPITAL_COMMUNITY)
Admission: RE | Admit: 2016-03-21 | Discharge: 2016-03-21 | Disposition: A | Discharge status: Home / Self Care | Payer: Medicare Other | Source: Ambulatory Visit | Attending: Orthopedic Surgery | Admitting: Orthopedic Surgery

## 2016-03-21 DIAGNOSIS — Z01818 Encounter for other preprocedural examination: Secondary | ICD-10-CM | POA: Insufficient documentation

## 2016-03-21 HISTORY — DX: Periapical abscess without sinus: K04.7

## 2016-03-21 NOTE — Progress Notes (Signed)
03-21-16 [id number] Spoke with Rosalva FerronSherry Wills, scheduler -Dr. Nilsa Nuttinglin's office- Pt here, states has tooth infection and place on Penicillin- Dentist not able to schedule an appointment to see yet" "was not sure what to do next.". Cordelia PenSherry spoke with Courtney,RN in Dr.Olin's nurse-will cancel surgery until pt can get tooth issue resolved. Pt. Made aware- Cordelia PenSherry spoke with pt per phone to inform of this in my office. Pt left going home- no further preop work up done.

## 2016-03-26 ENCOUNTER — Encounter (HOSPITAL_COMMUNITY): Admission: RE | Payer: Self-pay | Source: Ambulatory Visit

## 2016-03-26 ENCOUNTER — Inpatient Hospital Stay (HOSPITAL_COMMUNITY): Admission: RE | Admit: 2016-03-26 | Payer: Medicare Other | Source: Ambulatory Visit | Admitting: Orthopedic Surgery

## 2016-03-26 SURGERY — ARTHROPLASTY, KNEE, TOTAL
Anesthesia: Spinal | Site: Knee | Laterality: Right

## 2016-04-21 NOTE — H&P (Signed)
TOTAL KNEE ADMISSION H&P  Patient is being admitted for right total knee arthroplasty.  Subjective:  Chief Complaint:    Right knee primary OA / pain  HPI: Kim Nichols, 82 y.o. female, has a history of pain and functional disability in the right knee due to arthritis and has failed non-surgical conservative treatments for greater than 12 weeks to includecorticosteriod injections, use of assistive devices and activity modification.  Onset of symptoms was gradual, starting 5+ years ago with gradually worsening course since that time. The patient noted prior procedures on the knee to include  arthroscopy and menisectomy on the right knee(s).  Patient currently rates pain in the right knee(s) at 9 out of 10 with activity. Patient has night pain, worsening of pain with activity and weight bearing, pain that interferes with activities of daily living, pain with passive range of motion, crepitus and joint swelling.  Patient has evidence of periarticular osteophytes and joint space narrowing by imaging studies.  There is no active infection.   Risks, benefits and expectations were discussed with the patient.  Risks including but not limited to the risk of anesthesia, blood clots, nerve damage, blood vessel damage, failure of the prosthesis, infection and up to and including death.  Patient understand the risks, benefits and expectations and wishes to proceed with surgery.   PCP: [name],[name], MD  D/C Plans:      Home  Post-op Meds:       No Rx given  Tranexamic Acid:      To be given - topically  (previous DVT)  Decadron:      Is to be given   FYI:     Xarelto then ASA (Previous DVT)  Norco  Morphine ( pt states that morphine is ok, but not dilaudid)    Patient Active Problem List   Diagnosis Date Noted  . Type II or unspecified type diabetes mellitus without mention of complication, not stated as uncontrolled 07/06/2013  . Femur fracture, right (HCC) 06/28/2013  . Acute encephalopathy  06/22/2013  . Delirium 06/22/2013  . DVT of leg (deep venous thrombosis) (HCC) 06/22/2013  . HTN (hypertension) 06/21/2013  . Hyperlipidemia 06/21/2013  . Anemia 06/21/2013  . Pneumonia 06/21/2013  . Acute respiratory failure (HCC) 06/21/2013   Past Medical History  Diagnosis Date  . Hypertension   . Diabetes mellitus without complication (HCC)   . Dyslipidemia   . History of gastroesophageal reflux (GERD)   . Osteoarthritis   . Anxiety disorder   . Chronic insomnia   . Incontinence of urine   . Tooth infection     03-21-16 on antibiotic at present- tooth issue not resolved.    Past Surgical History  Procedure Laterality Date  . Knee surgery  4-11    No prescriptions prior to admission   Allergies  Allergen Reactions  . Naproxen Itching and Other (See Comments)    Decreased blood pressure  . Aleve [Naproxen Sodium] Itching  . Dilaudid [Hydromorphone]     Patient can not tolerate this medication in doses    Social History  Substance Use Topics  . Smoking status: Never Smoker   . Smokeless tobacco: Never Used  . Alcohol Use: No    Family History  Problem Relation Age of Onset  . Heart attack Mother   . Heart attack Sister   . Hypertension Mother   . Hypertension Brother     x2  . Stroke Neg Hx      Review of Systems  Constitutional:  Negative.   HENT: Positive for hearing loss.   Eyes: Negative.   Respiratory: Negative.   Cardiovascular: Negative.   Gastrointestinal: Positive for heartburn.  Genitourinary: Positive for frequency.  Musculoskeletal: Positive for joint pain.  Skin: Negative.   Neurological: Negative.   Endo/Heme/Allergies: Negative.   Psychiatric/Behavioral: Positive for memory loss. The patient is nervous/anxious and has insomnia.     Objective:  Physical Exam  Constitutional: She is oriented to person, place, and time. She appears well-developed.  HENT:  Head: Normocephalic.  Mouth/Throat: She has dentures.  Eyes: Pupils are equal,  round, and reactive to light.  Neck: Neck supple. No JVD present. No tracheal deviation present. No thyromegaly present.  Cardiovascular: Normal rate, regular rhythm, normal heart sounds and intact distal pulses.   Respiratory: Effort normal and breath sounds normal. No stridor. No respiratory distress. She has no wheezes.  GI: Soft. There is no tenderness. There is no guarding.  Musculoskeletal:       Right knee: She exhibits decreased range of motion, swelling, abnormal alignment and bony tenderness. She exhibits no ecchymosis, no deformity, no laceration and no erythema. Tenderness found.  Lymphadenopathy:    She has no cervical adenopathy.  Neurological: She is alert and oriented to person, place, and time. A sensory deficit (burning and tingling in the right foot) is present.  Skin: Skin is warm and dry.  Psychiatric: She has a normal mood and affect.      Labs:  Estimated body mass index is 22.05 kg/(m^2) as calculated from the following:   Height as of 02/16/16:  (1.626 m).   Weight as of 03/21/16: 58.287 kg (128 lb 8 oz).   Imaging Review Plain radiographs demonstrate severe degenerative joint disease of the right knee(s). The overall alignment is significant valgus. The bone quality appears to be good for age and reported activity level.  Assessment/Plan:  End stage arthritis, right knee   The patient history, physical examination, clinical judgment of the provider and imaging studies are consistent with end stage degenerative joint disease of the right knee(s) and total knee arthroplasty is deemed medically necessary. The treatment options including medical management, injection therapy arthroscopy and arthroplasty were discussed at length. The risks and benefits of total knee arthroplasty were presented and reviewed. The risks due to aseptic loosening, infection, stiffness, patella tracking problems, thromboembolic complications and other imponderables were discussed. The  patient acknowledged the explanation, agreed to proceed with the plan and consent was signed. Patient is being admitted for inpatient treatment for surgery, pain control, PT, OT, prophylactic antibiotics, VTE prophylaxis, progressive ambulation and ADL's and discharge planning. The patient is planning to be discharged home with home health services.       Anastasio Auerbach Doneta Bayman   PA-C  04/21/2016, 2:04 PM

## 2016-05-07 ENCOUNTER — Encounter (HOSPITAL_COMMUNITY): Payer: Self-pay

## 2016-05-07 ENCOUNTER — Encounter (HOSPITAL_COMMUNITY)
Admission: RE | Admit: 2016-05-07 | Discharge: 2016-05-07 | Disposition: A | Discharge status: Home / Self Care | Payer: Medicare Other | Source: Ambulatory Visit | Attending: Orthopedic Surgery | Admitting: Orthopedic Surgery

## 2016-05-07 DIAGNOSIS — M1711 Unilateral primary osteoarthritis, right knee: Secondary | ICD-10-CM | POA: Insufficient documentation

## 2016-05-07 DIAGNOSIS — Z01812 Encounter for preprocedural laboratory examination: Secondary | ICD-10-CM | POA: Insufficient documentation

## 2016-05-07 HISTORY — DX: Personal history of urinary calculi: Z87.442

## 2016-05-07 HISTORY — DX: Age-related osteoporosis without current pathological fracture: M81.0

## 2016-05-07 LAB — BASIC METABOLIC PANEL
ANION GAP: 7 (ref 5–15)
BUN: 14 mg/dL (ref 6–20)
CALCIUM: 9.5 mg/dL (ref 8.9–10.3)
CO2: 30 mmol/L (ref 22–32)
Chloride: 105 mmol/L (ref 101–111)
Creatinine, Ser: 0.67 mg/dL (ref 0.44–1.00)
Glucose, Bld: 101 mg/dL — ABNORMAL HIGH (ref 65–99)
POTASSIUM: 4.4 mmol/L (ref 3.5–5.1)
SODIUM: 142 mmol/L (ref 135–145)

## 2016-05-07 LAB — SURGICAL PCR SCREEN
MRSA, PCR: NEGATIVE
STAPHYLOCOCCUS AUREUS: POSITIVE — AB

## 2016-05-07 LAB — NO BLOOD PRODUCTS

## 2016-05-07 NOTE — Patient Instructions (Addendum)
Kim Nichols  05/07/2016   Your procedure is scheduled on: 05-14-16  [facility]  [facility] 3rd floor to  Short Stay Center at 700 AM.  Call this number if you have problems the morning of surgery [phone]   Remember: ONLY 1 PERSON MAY GO WITH YOU TO SHORT STAY TO GET  READY MORNING OF YOUR SURGERY.  Do not eat food or drink liquids :After Midnight.     Take these medicines the morning of surgery with A SIP OF WATER: CLONAZEPAM (KLONOPIN), NEXIUM IF DUE, GABAPENTIN (NEURONTIN) DO NOT TAKE ANY DIABETIC MEDICATIONS DAY OF YOUR SURGERY                               You may not have any metal on your body including hair pins and              piercings  Do not wear jewelry, make-up, lotions, powders or perfumes, deodorant             Do not wear nail polish.  Do not shave  48 hours prior to surgery.              Men may shave face and neck.   Do not bring valuables to the hospital. [place] IS NOT             RESPONSIBLE   FOR VALUABLES.  Contacts, dentures or bridgework may not be worn into surgery.  Leave suitcase in the car. After surgery it may be brought to your room.     Patients discharged the day of surgery will not be allowed to drive home.  Name and phone number of your driver:  Special Instructions: N/A              Please read over the following fact sheets you were given: _____________________________________________________________________             [facility] Pre[facility] Before surgery, you can play an important role.  Because skin is not sterile, your skin needs to be as free of germs as possible.  You can reduce the number of germs on your skin by washing with CHG (chlorahexidine gluconate) soap before surgery.  CHG is an antiseptic cleaner which kills germs and bonds with the skin to continue killing germs even after washing. Please DO NOT use if you have an allergy to CHG or  antibacterial soaps.  If your skin becomes reddened/irritated stop using the CHG and inform your nurse when you arrive at Short Stay. Do not shave (including legs and underarms) for at least 48 hours prior to the first CHG shower.  You may shave your face/neck. Please follow these instructions carefully:  1.  Shower with CHG Soap the night before surgery and the  morning of Surgery.  2.  If you choose to wash your hair, wash your hair first as usual with your  normal  shampoo.  3.  After you shampoo, rinse your hair and body thoroughly to remove the  shampoo.                           4.  Use CHG as you would any other liquid soap.  You can apply chg directly  to the  skin and wash                       Gently with a scrungie or clean washcloth.  5.  Apply the CHG Soap to your body ONLY FROM THE NECK DOWN.   Do not use on face/ open                           Wound or open sores. Avoid contact with eyes, ears mouth and genitals (private parts).                       Wash face,  Genitals (private parts) with your normal soap.             6.  Wash thoroughly, paying special attention to the area where your surgery  will be performed.  7.  Thoroughly rinse your body with warm water from the neck down.  8.  DO NOT shower/wash with your normal soap after using and rinsing off  the CHG Soap.                9.  Pat yourself dry with a clean towel.            10.  Wear clean pajamas.            11.  Place clean sheets on your bed the night of your first shower and do not  sleep with pets. Day of Surgery : Do not apply any lotions/deodorants the morning of surgery.  Please wear clean clothes to the hospital/surgery center.  FAILURE TO FOLLOW THESE INSTRUCTIONS MAY RESULT IN THE CANCELLATION OF YOUR SURGERY PATIENT SIGNATURE_________________________________  NURSE SIGNATURE__________________________________  ________________________________________________________________________   Kim Nichols  An incentive spirometer is a tool that can help keep your lungs clear and active. This tool measures how well you are filling your lungs with each breath. Taking long deep breaths may help reverse or decrease the chance of developing breathing (pulmonary) problems (especially infection) following:  A long period of time when you are unable to move or be active. BEFORE THE PROCEDURE   If the spirometer includes an indicator to show your best effort, your nurse or respiratory therapist will set it to a desired goal.  If possible, sit up straight or lean slightly forward. Try not to slouch.  Hold the incentive spirometer in an upright position. INSTRUCTIONS FOR USE   Sit on the edge of your bed if possible, or sit up as far as you can in bed or on a chair.  Hold the incentive spirometer in an upright position.  Breathe out normally.  Place the mouthpiece in your mouth and seal your lips tightly around it.  Breathe in slowly and as deeply as possible, raising the piston or the ball toward the top of the column.  Hold your breath for 3-5 seconds or for as long as possible. Allow the piston or ball to fall to the bottom of the column.  Remove the mouthpiece from your mouth and breathe out normally.  Rest for a few seconds and repeat Steps 1 through 7 at least 10 times every 1-2 hours when you are awake. Take your time and take a few normal breaths between deep breaths.  The spirometer may include an indicator to show your best effort. Use the indicator as a goal to work toward during each repetition.  After each set of  10 deep breaths, practice coughing to be sure your lungs are clear. If you have an incision (the cut made at the time of surgery), support your incision when coughing by placing a pillow or rolled up towels firmly against it. Once you are able to get out of bed, walk around indoors and cough well. You may stop using the incentive spirometer when instructed by  your caregiver.  RISKS AND COMPLICATIONS  Take your time so you do not get dizzy or light-headed.  If you are in pain, you may need to take or ask for pain medication before doing incentive spirometry. It is harder to take a deep breath if you are having pain. AFTER USE  Rest and breathe slowly and easily.  It can be helpful to keep track of a log of your progress. Your caregiver can provide you with a simple table to help with this. If you are using the spirometer at home, follow these instructions: [place] IF:   You are having difficultly using the spirometer.  You have trouble using the spirometer as often as instructed.  Your pain medication is not giving enough relief while using the spirometer.  You develop fever of 100.5 F (38.1 C) or higher. SEEK IMMEDIATE MEDICAL CARE IF:   You cough up bloody sputum that had not been present before.  You develop fever of 102 F (38.9 C) or greater.  You develop worsening pain at or near the incision site. MAKE SURE YOU:   Understand these instructions.  Will watch your condition.  Will get help right away if you are not doing well or get worse. Document Released: 04/28/2007 Document Revised: 03/09/2012 Document Reviewed: 06/29/2007 Exit[employer] Patient Information 2014 Exit[employer], [place].   ________________________________________________________________________  WHAT IS A BLOOD TRANSFUSION? Blood Transfusion Information  A transfusion is the replacement of blood or some of its parts. Blood is made up of multiple cells which provide different functions.  Red blood cells carry oxygen and are used for blood loss replacement.  White blood cells fight against infection.  Platelets control bleeding.  Plasma helps clot blood.  Other blood products are available for specialized needs, such as hemophilia or other clotting disorders. BEFORE THE TRANSFUSION  Who gives blood for transfusions?   Healthy volunteers who are  fully evaluated to make sure their blood is safe. This is blood bank blood. Transfusion therapy is the safest it has ever been in the practice of medicine. Before blood is taken from a donor, a complete history is taken to make sure that person has no history of diseases nor engages in risky social behavior (examples are intravenous drug use or sexual activity with multiple partners). The donor's travel history is screened to minimize risk of transmitting infections, such as malaria. The donated blood is tested for signs of infectious diseases, such as HIV and hepatitis. The blood is then tested to be sure it is compatible with you in order to minimize the chance of a transfusion reaction. If you or a relative donates blood, this is often done in anticipation of surgery and is not appropriate for emergency situations. It takes many days to process the donated blood. RISKS AND COMPLICATIONS Although transfusion therapy is very safe and saves many lives, the main dangers of transfusion include:   Getting an infectious disease.  Developing a transfusion reaction. This is an allergic reaction to something in the blood you were given. Every precaution is taken to prevent this. The decision to have a blood  transfusion has been considered carefully by your caregiver before blood is given. Blood is not given unless the benefits outweigh the risks. AFTER THE TRANSFUSION  Right after receiving a blood transfusion, you will usually feel much better and more energetic. This is especially true if your red blood cells have gotten low (anemic). The transfusion raises the level of the red blood cells which carry oxygen, and this usually causes an energy increase.  The nurse administering the transfusion will monitor you carefully for complications. HOME CARE INSTRUCTIONS  No special instructions are needed after a transfusion. You may find your energy is better. Speak with your caregiver about any limitations on  activity for underlying diseases you may have. SEEK MEDICAL CARE IF:   Your condition is not improving after your transfusion.  You develop redness or irritation at the intravenous (IV) site. SEEK IMMEDIATE MEDICAL CARE IF:  Any of the following symptoms occur over the next 12 hours:  Shaking chills.  You have a temperature by mouth above 102 F (38.9 C), not controlled by medicine.  Chest, back, or muscle pain.  People around you feel you are not acting correctly or are confused.  Shortness of breath or difficulty breathing.  Dizziness and fainting.  You get a rash or develop hives.  You have a decrease in urine output.  Your urine turns a dark color or changes to pink, red, or brown. Any of the following symptoms occur over the next 10 days:  You have a temperature by mouth above 102 F (38.9 C), not controlled by medicine.  Shortness of breath.  Weakness after normal activity.  The white part of the eye turns yellow (jaundice).  You have a decrease in the amount of urine or are urinating less often.  Your urine turns a dark color or changes to pink, red, or brown. Document Released: 12/13/2000 Document Revised: 03/09/2012 Document Reviewed: 08/01/2008 [facility] Patient Information 2014 [place], [place].  _______________________________________________________________________

## 2016-05-07 NOTE — Progress Notes (Addendum)
Blood refusal faxed to dr Charlann Boxerolin and [facility], fax confirmation received and placed on chart.  cbc and hemaglobin A1c dr Donette Larryhusain 04-18-16 on chart ekg 02-16-16 epic  cardiac clearance  Note dr Tenny Crawross 02-16-16 epic

## 2016-05-14 ENCOUNTER — Encounter (HOSPITAL_COMMUNITY): Payer: Self-pay | Admitting: *Deleted

## 2016-05-14 ENCOUNTER — Inpatient Hospital Stay (HOSPITAL_COMMUNITY): Payer: Medicare Other | Admitting: Anesthesiology

## 2016-05-14 ENCOUNTER — Inpatient Hospital Stay (HOSPITAL_COMMUNITY)
Admission: AD | Admit: 2016-05-14 | Discharge: 2016-05-16 | DRG: 470 | Disposition: A | Discharge status: Home Health | Payer: Medicare Other | Source: Ambulatory Visit | Attending: Orthopedic Surgery | Admitting: Orthopedic Surgery

## 2016-05-14 ENCOUNTER — Encounter (HOSPITAL_COMMUNITY)
Admission: AD | Disposition: A | Discharge status: Home Health | Payer: Self-pay | Source: Ambulatory Visit | Attending: Orthopedic Surgery

## 2016-05-14 DIAGNOSIS — M659 Synovitis and tenosynovitis, unspecified: Secondary | ICD-10-CM | POA: Diagnosis present

## 2016-05-14 DIAGNOSIS — M1711 Unilateral primary osteoarthritis, right knee: Secondary | ICD-10-CM | POA: Diagnosis present

## 2016-05-14 DIAGNOSIS — Z8249 Family history of ischemic heart disease and other diseases of the circulatory system: Secondary | ICD-10-CM | POA: Diagnosis not present

## 2016-05-14 DIAGNOSIS — Z96659 Presence of unspecified artificial knee joint: Secondary | ICD-10-CM

## 2016-05-14 DIAGNOSIS — E119 Type 2 diabetes mellitus without complications: Secondary | ICD-10-CM | POA: Diagnosis present

## 2016-05-14 DIAGNOSIS — H9190 Unspecified hearing loss, unspecified ear: Secondary | ICD-10-CM | POA: Diagnosis present

## 2016-05-14 DIAGNOSIS — I739 Peripheral vascular disease, unspecified: Secondary | ICD-10-CM | POA: Diagnosis present

## 2016-05-14 DIAGNOSIS — Z86718 Personal history of other venous thrombosis and embolism: Secondary | ICD-10-CM | POA: Diagnosis not present

## 2016-05-14 DIAGNOSIS — I10 Essential (primary) hypertension: Secondary | ICD-10-CM | POA: Diagnosis present

## 2016-05-14 DIAGNOSIS — Z96651 Presence of right artificial knee joint: Secondary | ICD-10-CM

## 2016-05-14 DIAGNOSIS — E785 Hyperlipidemia, unspecified: Secondary | ICD-10-CM | POA: Diagnosis present

## 2016-05-14 DIAGNOSIS — M25561 Pain in right knee: Secondary | ICD-10-CM | POA: Diagnosis present

## 2016-05-14 HISTORY — PX: TOTAL KNEE ARTHROPLASTY: SHX125

## 2016-05-14 LAB — GLUCOSE, CAPILLARY
Glucose-Capillary: 101 mg/dL — ABNORMAL HIGH (ref 65–99)
Glucose-Capillary: 114 mg/dL — ABNORMAL HIGH (ref 65–99)
Glucose-Capillary: 259 mg/dL — ABNORMAL HIGH (ref 65–99)
Glucose-Capillary: 283 mg/dL — ABNORMAL HIGH (ref 65–99)

## 2016-05-14 SURGERY — ARTHROPLASTY, KNEE, TOTAL
Anesthesia: Spinal | Site: Knee | Laterality: Right

## 2016-05-14 MED ORDER — PHENOL 1.4 % MT LIQD
1.0000 | OROMUCOSAL | Status: DC | PRN
Start: 2016-05-14 — End: 2016-05-16

## 2016-05-14 MED ORDER — MEPERIDINE HCL 50 MG/ML IJ SOLN
INTRAMUSCULAR | Status: AC
  Filled 2016-05-14: qty 1

## 2016-05-14 MED ORDER — TRANEXAMIC ACID 1000 MG/10ML IV SOLN
2000.0000 mg | INTRAVENOUS | Status: DC | PRN
  Administered 2016-05-14: 2000 mg via TOPICAL

## 2016-05-14 MED ORDER — FENTANYL CITRATE (PF) 100 MCG/2ML IJ SOLN: 25.0000 ug | INTRAMUSCULAR | Status: DC | PRN

## 2016-05-14 MED ORDER — SODIUM CHLORIDE 0.9 % IJ SOLN
INTRAMUSCULAR | Status: DC | PRN
  Administered 2016-05-14: 30 mL

## 2016-05-14 MED ORDER — DEXAMETHASONE SODIUM PHOSPHATE 10 MG/ML IJ SOLN
10.0000 mg | Freq: Once | INTRAMUSCULAR | Status: AC
  Administered 2016-05-14: 10 mg via INTRAVENOUS

## 2016-05-14 MED ORDER — ONDANSETRON HCL 4 MG PO TABS: 4.0000 mg | ORAL_TABLET | Freq: Four times a day (QID) | ORAL | Status: DC | PRN

## 2016-05-14 MED ORDER — PROPOFOL 500 MG/50ML IV EMUL
INTRAVENOUS | Status: DC | PRN
  Administered 2016-05-14: 75 ug/kg/min via INTRAVENOUS

## 2016-05-14 MED ORDER — PROPOFOL 10 MG/ML IV BOLUS
INTRAVENOUS | Status: AC
  Filled 2016-05-14: qty 60

## 2016-05-14 MED ORDER — HYDROCODONE-ACETAMINOPHEN 7.5-325 MG PO TABS
1.0000 | ORAL_TABLET | ORAL | Status: DC
  Administered 2016-05-14 (×2): 1 via ORAL
  Administered 2016-05-14 – 2016-05-15 (×4): 2 via ORAL
  Filled 2016-05-14: qty 2
  Filled 2016-05-14: qty 1
  Filled 2016-05-14 (×2): qty 2
  Filled 2016-05-14: qty 1
  Filled 2016-05-14: qty 2
  Filled 2016-05-14: qty 1

## 2016-05-14 MED ORDER — PANTOPRAZOLE SODIUM 40 MG PO TBEC
80.0000 mg | DELAYED_RELEASE_TABLET | Freq: Every day | ORAL | Status: DC
  Administered 2016-05-14 – 2016-05-15 (×2): 80 mg via ORAL
  Filled 2016-05-14 (×2): qty 2

## 2016-05-14 MED ORDER — DEXAMETHASONE SODIUM PHOSPHATE 10 MG/ML IJ SOLN
INTRAMUSCULAR | Status: AC
  Filled 2016-05-14: qty 1

## 2016-05-14 MED ORDER — CLONIDINE HCL 0.1 MG PO TABS
0.1000 mg | ORAL_TABLET | Freq: Every day | ORAL | Status: DC
  Administered 2016-05-14 – 2016-05-15 (×2): 0.1 mg via ORAL
  Filled 2016-05-14 (×2): qty 1

## 2016-05-14 MED ORDER — MENTHOL 3 MG MT LOZG: 1.0000 | LOZENGE | OROMUCOSAL | Status: DC | PRN

## 2016-05-14 MED ORDER — INSULIN ASPART 100 UNIT/ML ~~LOC~~ SOLN
0.0000 [IU] | Freq: Three times a day (TID) | SUBCUTANEOUS | Status: DC
  Administered 2016-05-16: 2 [IU] via SUBCUTANEOUS

## 2016-05-14 MED ORDER — ONDANSETRON HCL 4 MG/2ML IJ SOLN: 4.0000 mg | Freq: Four times a day (QID) | INTRAMUSCULAR | Status: DC | PRN

## 2016-05-14 MED ORDER — LACTATED RINGERS IV SOLN
INTRAVENOUS | Status: DC
  Administered 2016-05-14 (×2): via INTRAVENOUS

## 2016-05-14 MED ORDER — ALUM & MAG HYDROXIDE-SIMETH 200-200-20 MG/5ML PO SUSP: 30.0000 mL | ORAL | Status: DC | PRN

## 2016-05-14 MED ORDER — LIP MEDEX EX OINT
TOPICAL_OINTMENT | CUTANEOUS | Status: AC
  Administered 2016-05-14: 1
  Filled 2016-05-14: qty 7

## 2016-05-14 MED ORDER — FENTANYL CITRATE (PF) 100 MCG/2ML IJ SOLN
INTRAMUSCULAR | Status: AC
  Filled 2016-05-14: qty 2

## 2016-05-14 MED ORDER — DIPHENHYDRAMINE HCL 25 MG PO CAPS: 25.0000 mg | ORAL_CAPSULE | Freq: Four times a day (QID) | ORAL | Status: DC | PRN

## 2016-05-14 MED ORDER — CEFAZOLIN SODIUM-DEXTROSE 2-4 GM/100ML-% IV SOLN
INTRAVENOUS | Status: AC
  Filled 2016-05-14: qty 100

## 2016-05-14 MED ORDER — DEXAMETHASONE SODIUM PHOSPHATE 10 MG/ML IJ SOLN
10.0000 mg | Freq: Once | INTRAMUSCULAR | Status: AC
  Administered 2016-05-16: 10 mg via INTRAVENOUS
  Filled 2016-05-14: qty 1

## 2016-05-14 MED ORDER — FERROUS SULFATE 325 (65 FE) MG PO TABS
325.0000 mg | ORAL_TABLET | Freq: Three times a day (TID) | ORAL | Status: DC
Start: 2016-05-14 — End: 2016-05-16
  Administered 2016-05-15 – 2016-05-16 (×3): 325 mg via ORAL
  Filled 2016-05-14 (×3): qty 1

## 2016-05-14 MED ORDER — KETOROLAC TROMETHAMINE 30 MG/ML IJ SOLN
INTRAMUSCULAR | Status: AC
  Filled 2016-05-14: qty 1

## 2016-05-14 MED ORDER — BUPIVACAINE-EPINEPHRINE (PF) 0.25% -1:200000 IJ SOLN
INTRAMUSCULAR | Status: DC | PRN
  Administered 2016-05-14: 30 mL

## 2016-05-14 MED ORDER — PROPOFOL 10 MG/ML IV BOLUS
INTRAVENOUS | Status: DC | PRN
  Administered 2016-05-14: 30 mg via INTRAVENOUS

## 2016-05-14 MED ORDER — SODIUM CHLORIDE 0.9 % IR SOLN
Status: DC | PRN
  Administered 2016-05-14: 1000 mL

## 2016-05-14 MED ORDER — DEXTROSE 5 % IV SOLN
10.0000 mg | INTRAVENOUS | Status: DC | PRN
  Administered 2016-05-14: 30 ug/min via INTRAVENOUS

## 2016-05-14 MED ORDER — CLONAZEPAM 0.5 MG PO TABS
0.5000 mg | ORAL_TABLET | Freq: Every day | ORAL | Status: DC
  Administered 2016-05-16: 0.5 mg via ORAL
  Filled 2016-05-14: qty 1

## 2016-05-14 MED ORDER — FELODIPINE ER 5 MG PO TB24
5.0000 mg | ORAL_TABLET | Freq: Every day | ORAL | Status: DC
  Administered 2016-05-14 – 2016-05-16 (×3): 5 mg via ORAL
  Filled 2016-05-14 (×3): qty 1

## 2016-05-14 MED ORDER — METHOCARBAMOL 1000 MG/10ML IJ SOLN
500.0000 mg | Freq: Four times a day (QID) | INTRAVENOUS | Status: DC | PRN
  Administered 2016-05-14: 500 mg via INTRAVENOUS
  Filled 2016-05-14: qty 5
  Filled 2016-05-14: qty 550

## 2016-05-14 MED ORDER — CELECOXIB 200 MG PO CAPS
200.0000 mg | ORAL_CAPSULE | Freq: Two times a day (BID) | ORAL | Status: DC
  Administered 2016-05-14 – 2016-05-16 (×4): 200 mg via ORAL
  Filled 2016-05-14 (×4): qty 1

## 2016-05-14 MED ORDER — BUPIVACAINE-EPINEPHRINE (PF) 0.25% -1:200000 IJ SOLN
INTRAMUSCULAR | Status: AC
  Filled 2016-05-14: qty 30

## 2016-05-14 MED ORDER — BISACODYL 10 MG RE SUPP: 10.0000 mg | Freq: Every day | RECTAL | Status: DC | PRN

## 2016-05-14 MED ORDER — MORPHINE SULFATE (PF) 2 MG/ML IV SOLN: 1.0000 mg | INTRAVENOUS | Status: DC | PRN

## 2016-05-14 MED ORDER — SIMVASTATIN 20 MG PO TABS
40.0000 mg | ORAL_TABLET | Freq: Every day | ORAL | Status: DC
  Administered 2016-05-14 – 2016-05-15 (×2): 40 mg via ORAL
  Filled 2016-05-14 (×2): qty 2

## 2016-05-14 MED ORDER — BUPIVACAINE IN DEXTROSE 0.75-8.25 % IT SOLN
INTRATHECAL | Status: DC | PRN
  Administered 2016-05-14: 1.8 mL via INTRATHECAL

## 2016-05-14 MED ORDER — CEFAZOLIN SODIUM-DEXTROSE 2-4 GM/100ML-% IV SOLN
2.0000 g | Freq: Four times a day (QID) | INTRAVENOUS | Status: AC
  Administered 2016-05-14 (×2): 2 g via INTRAVENOUS
  Filled 2016-05-14 (×2): qty 100

## 2016-05-14 MED ORDER — METOCLOPRAMIDE HCL 5 MG PO TABS
5.0000 mg | ORAL_TABLET | Freq: Three times a day (TID) | ORAL | Status: DC | PRN
Start: 2016-05-14 — End: 2016-05-16

## 2016-05-14 MED ORDER — MIDAZOLAM HCL 2 MG/2ML IJ SOLN
INTRAMUSCULAR | Status: AC
  Filled 2016-05-14: qty 2

## 2016-05-14 MED ORDER — METFORMIN HCL 500 MG PO TABS
500.0000 mg | ORAL_TABLET | Freq: Two times a day (BID) | ORAL | Status: DC
  Administered 2016-05-14 – 2016-05-16 (×4): 500 mg via ORAL
  Filled 2016-05-14 (×4): qty 1

## 2016-05-14 MED ORDER — METOCLOPRAMIDE HCL 5 MG/ML IJ SOLN: 5.0000 mg | Freq: Three times a day (TID) | INTRAMUSCULAR | Status: DC | PRN

## 2016-05-14 MED ORDER — MAGNESIUM CITRATE PO SOLN: 1.0000 | Freq: Once | ORAL | Status: DC | PRN

## 2016-05-14 MED ORDER — CLONAZEPAM 1 MG PO TABS
1.0000 mg | ORAL_TABLET | Freq: Every day | ORAL | Status: DC
  Administered 2016-05-14 – 2016-05-15 (×2): 1 mg via ORAL
  Filled 2016-05-14 (×2): qty 1

## 2016-05-14 MED ORDER — MEPERIDINE HCL 50 MG/ML IJ SOLN
6.2500 mg | INTRAMUSCULAR | Status: DC | PRN
  Administered 2016-05-14: 6.25 mg via INTRAVENOUS

## 2016-05-14 MED ORDER — RIVAROXABAN 10 MG PO TABS
10.0000 mg | ORAL_TABLET | ORAL | Status: DC
  Administered 2016-05-15 – 2016-05-16 (×2): 10 mg via ORAL
  Filled 2016-05-14 (×2): qty 1

## 2016-05-14 MED ORDER — SODIUM CHLORIDE 0.9 % IJ SOLN
INTRAMUSCULAR | Status: AC
  Filled 2016-05-14: qty 50

## 2016-05-14 MED ORDER — SODIUM CHLORIDE 0.9 % IV SOLN
INTRAVENOUS | Status: DC
Start: 2016-05-14 — End: 2016-05-16
  Administered 2016-05-14: 100 mL/h via INTRAVENOUS
  Administered 2016-05-15: 01:00:00 via INTRAVENOUS

## 2016-05-14 MED ORDER — DOCUSATE SODIUM 100 MG PO CAPS
100.0000 mg | ORAL_CAPSULE | Freq: Two times a day (BID) | ORAL | Status: DC
Start: 2016-05-14 — End: 2016-05-16
  Administered 2016-05-14 – 2016-05-16 (×4): 100 mg via ORAL
  Filled 2016-05-14 (×4): qty 1

## 2016-05-14 MED ORDER — KETOROLAC TROMETHAMINE 30 MG/ML IJ SOLN
INTRAMUSCULAR | Status: DC | PRN
  Administered 2016-05-14: 30 mg

## 2016-05-14 MED ORDER — FENTANYL CITRATE (PF) 100 MCG/2ML IJ SOLN
INTRAMUSCULAR | Status: DC | PRN
Start: 2016-05-14 — End: 2016-05-14
  Administered 2016-05-14 (×2): 50 ug via INTRAVENOUS

## 2016-05-14 MED ORDER — GABAPENTIN 300 MG PO CAPS
300.0000 mg | ORAL_CAPSULE | Freq: Three times a day (TID) | ORAL | Status: DC
  Administered 2016-05-14 – 2016-05-16 (×6): 300 mg via ORAL
  Filled 2016-05-14 (×6): qty 1

## 2016-05-14 MED ORDER — POLYETHYLENE GLYCOL 3350 17 G PO PACK
17.0000 g | PACK | Freq: Two times a day (BID) | ORAL | Status: DC
Start: 2016-05-14 — End: 2016-05-16
  Administered 2016-05-14 – 2016-05-16 (×3): 17 g via ORAL
  Filled 2016-05-14 (×4): qty 1

## 2016-05-14 MED ORDER — CEFAZOLIN SODIUM-DEXTROSE 2-4 GM/100ML-% IV SOLN
2.0000 g | INTRAVENOUS | Status: AC
  Administered 2016-05-14: 2 g via INTRAVENOUS

## 2016-05-14 MED ORDER — METHOCARBAMOL 500 MG PO TABS
500.0000 mg | ORAL_TABLET | Freq: Four times a day (QID) | ORAL | Status: DC | PRN
  Administered 2016-05-15 – 2016-05-16 (×3): 500 mg via ORAL
  Filled 2016-05-14 (×3): qty 1

## 2016-05-14 MED ORDER — CLONAZEPAM 0.5 MG PO TABS: 0.5000 mg | ORAL_TABLET | Freq: Two times a day (BID) | ORAL | Status: DC

## 2016-05-14 MED ORDER — PHENYLEPHRINE HCL 10 MG/ML IJ SOLN
INTRAMUSCULAR | Status: AC
  Filled 2016-05-14: qty 1

## 2016-05-14 MED ORDER — CHLORHEXIDINE GLUCONATE 0.12 % MT SOLN
15.0000 mL | Freq: Two times a day (BID) | OROMUCOSAL | Status: DC
Start: 2016-05-14 — End: 2016-05-16
  Administered 2016-05-14 – 2016-05-16 (×5): 15 mL via OROMUCOSAL
  Filled 2016-05-14 (×5): qty 15

## 2016-05-14 MED ORDER — LABETALOL HCL 5 MG/ML IV SOLN
INTRAVENOUS | Status: AC
  Filled 2016-05-14: qty 4

## 2016-05-14 MED ORDER — TRANEXAMIC ACID 1000 MG/10ML IV SOLN
2000.0000 mg | Freq: Once | INTRAVENOUS | Status: DC
  Filled 2016-05-14: qty 20

## 2016-05-14 SURGICAL SUPPLY — 49 items
BAG DECANTER FOR FLEXI CONT (MISCELLANEOUS) IMPLANT
BAG SPEC THK2 15X12 ZIP CLS (MISCELLANEOUS)
BAG ZIPLOCK 12X15 (MISCELLANEOUS) IMPLANT
BANDAGE ACE 6X5 VEL STRL LF (GAUZE/BANDAGES/DRESSINGS) ×1 IMPLANT
BANDAGE ELASTIC 6 VELCRO ST LF (GAUZE/BANDAGES/DRESSINGS) ×2 IMPLANT
BLADE SAW SGTL 13.0X1.19X90.0M (BLADE) ×3 IMPLANT
BONE CEMENT GENTAMICIN (Cement) ×6 IMPLANT
BOWL SMART MIX CTS (DISPOSABLE) ×3 IMPLANT
CAP KNEE TOTAL 3 SIGMA ×2 IMPLANT
CEMENT BONE GENTAMICIN 40 (Cement) IMPLANT
CLOTH BEACON ORANGE TIMEOUT ST (SAFETY) ×3 IMPLANT
CUFF TOURN SGL QUICK 34 (TOURNIQUET CUFF) ×3
CUFF TRNQT CYL 34X4X40X1 (TOURNIQUET CUFF) ×1 IMPLANT
DECANTER SPIKE VIAL GLASS SM (MISCELLANEOUS) ×3 IMPLANT
DRAPE U-SHAPE 47X51 STRL (DRAPES) ×3 IMPLANT
DRESSING AQUACEL AG SP 3.5X10 (GAUZE/BANDAGES/DRESSINGS) ×1 IMPLANT
DRSG AQUACEL AG SP 3.5X10 (GAUZE/BANDAGES/DRESSINGS) ×3
DURAPREP 26ML APPLICATOR (WOUND CARE) ×6 IMPLANT
ELECT REM PT RETURN 9FT ADLT (ELECTROSURGICAL) ×3
ELECTRODE REM PT RTRN 9FT ADLT (ELECTROSURGICAL) ×1 IMPLANT
GLOVE BIOGEL M 7.0 STRL (GLOVE) IMPLANT
GLOVE BIOGEL PI IND STRL 7.5 (GLOVE) ×1 IMPLANT
GLOVE BIOGEL PI IND STRL 8.5 (GLOVE) ×1 IMPLANT
GLOVE BIOGEL PI INDICATOR 7.5 (GLOVE) ×2
GLOVE BIOGEL PI INDICATOR 8.5 (GLOVE) ×2
GLOVE ECLIPSE 8.0 STRL XLNG CF (GLOVE) ×3 IMPLANT
GLOVE ORTHO TXT STRL SZ7.5 (GLOVE) ×6 IMPLANT
GOWN STRL REUS W/TWL LRG LVL3 (GOWN DISPOSABLE) ×3 IMPLANT
GOWN STRL REUS W/TWL XL LVL3 (GOWN DISPOSABLE) ×3 IMPLANT
HANDPIECE INTERPULSE COAX TIP (DISPOSABLE) ×3
LIQUID BAND (GAUZE/BANDAGES/DRESSINGS) ×3 IMPLANT
MANIFOLD NEPTUNE II (INSTRUMENTS) ×3 IMPLANT
PACK TOTAL KNEE CUSTOM (KITS) ×3 IMPLANT
POSITIONER SURGICAL ARM (MISCELLANEOUS) ×3 IMPLANT
SET HNDPC FAN SPRY TIP SCT (DISPOSABLE) ×1 IMPLANT
SET PAD KNEE POSITIONER (MISCELLANEOUS) ×3 IMPLANT
SUCTION FRAZIER HANDLE 12FR (TUBING) ×2
SUCTION TUBE FRAZIER 12FR DISP (TUBING) ×1 IMPLANT
SUT MNCRL AB 4-0 PS2 18 (SUTURE) ×3 IMPLANT
SUT VIC AB 1 CT1 36 (SUTURE) ×3 IMPLANT
SUT VIC AB 2-0 CT1 27 (SUTURE) ×9
SUT VIC AB 2-0 CT1 TAPERPNT 27 (SUTURE) ×3 IMPLANT
SUT VLOC 180 0 24IN GS25 (SUTURE) ×3 IMPLANT
SYR 50ML LL SCALE MARK (SYRINGE) ×3 IMPLANT
TRAY FOLEY W/METER SILVER 14FR (SET/KITS/TRAYS/PACK) ×3 IMPLANT
TRAY FOLEY W/METER SILVER 16FR (SET/KITS/TRAYS/PACK) ×1 IMPLANT
WATER STERILE IRR 1500ML POUR (IV SOLUTION) ×3 IMPLANT
WRAP KNEE MAXI GEL POST OP (GAUZE/BANDAGES/DRESSINGS) ×3 IMPLANT
YANKAUER SUCT BULB TIP 10FT TU (MISCELLANEOUS) ×3 IMPLANT

## 2016-05-14 NOTE — Progress Notes (Signed)
Medication given for shaking and shivering 

## 2016-05-14 NOTE — Progress Notes (Signed)
Shaking and shivering stopped. 

## 2016-05-14 NOTE — Anesthesia Procedure Notes (Signed)
Spinal Patient location during procedure: OR Staffing Anesthesiologist: Nolon Nations Performed by: anesthesiologist  Preanesthetic Checklist Completed: patient identified, site marked, surgical consent, pre-op evaluation, timeout performed, IV checked, risks and benefits discussed and monitors and equipment checked Spinal Block Patient position: sitting Prep: ChloraPrep Patient monitoring: heart rate, continuous pulse ox and blood pressure Location: L2-3 Injection technique: single-shot Needle Needle type: Spinocan  Needle gauge: 22 G Needle length: 9 cm Additional Notes Expiration date of kit checked and confirmed. Patient tolerated procedure well, without complications.

## 2016-05-14 NOTE — Progress Notes (Signed)
Utilization review completed.  

## 2016-05-14 NOTE — Anesthesia Postprocedure Evaluation (Signed)
Anesthesia Post Note  Patient: Kim Nichols  Procedure(s) Performed: Procedure(s) (LRB): RIGHT TOTAL KNEE ARTHROPLASTY (Right)  Patient location during evaluation: PACU Anesthesia Type: Spinal and MAC Level of consciousness: awake and alert Pain management: pain level controlled Vital Signs Assessment: post-procedure vital signs reviewed and stable Respiratory status: spontaneous breathing and respiratory function stable Cardiovascular status: blood pressure returned to baseline and stable Postop Assessment: spinal receding Anesthetic complications: no    Last Vitals:  Filed Vitals:   05/14/16 1430 05/14/16 1538  BP: 135/52 127/59  Pulse: 60 69  Temp: 36.6 C 36.6 C  Resp: 14 16    Last Pain:  Filed Vitals:   05/14/16 1539  PainSc: 0-No pain                 Lewie LoronJohn Parveen Freehling

## 2016-05-14 NOTE — Evaluation (Signed)
Physical Therapy Evaluation Patient Details Name: Kim Nichols MRN: [id number] DOB: 02-02-1929 Today's Date: 05/14/2016   History of Present Illness   RTKA  Clinical Impression  The patient ambulated x 14'. Plans DC to home, daughter will be available to assist. The patient will benefit from PT in acute care to DC to home and address the problems listed in the note below.   Follow Up Recommendations Home health PT;Supervision/Assistance - 24 hour    Equipment Recommendations  None recommended by PT    Recommendations for Other Services       Precautions / Restrictions Precautions Precautions: Fall;Knee      Mobility  Bed Mobility Overal bed mobility: Needs Assistance Bed Mobility: Supine to Sit;Sit to Supine     Supine to sit: Min assist Sit to supine: Min assist   General bed mobility comments: assist with the R leg, cues for hand placemnet  Transfers Overall transfer level: Needs assistance Equipment used: Rolling walker (2 wheeled) Transfers: Sit to/from Stand Sit to Stand: Min assist         General transfer comment: cues for hand and R leg position  Ambulation/Gait Ambulation/Gait assistance: Min assist Ambulation Distance (Feet): 14 Feet Assistive device: Rolling walker (2 wheeled) Gait Pattern/deviations: Step-to pattern;Antalgic     General Gait Details: cues for sequence and position inside the RW.  Stairs            Wheelchair Mobility    Modified Rankin (Stroke Patients Only)       Balance                                             Pertinent Vitals/Pain Pain Assessment: 0-10 Pain Score: 5  Pain Location: R knee Pain Descriptors / Indicators: Aching;Tightness Pain Intervention(s): Limited activity within patient's tolerance;Monitored during session;Premedicated before session;Repositioned;Ice applied    Home Living Family/patient expects to be discharged to:: Private residence Living Arrangements:  Alone;Children Available Help at Discharge: Family Type of Home: House Home Access: Stairs to enter Entrance Stairs-Rails: None Entrance Stairs-Number of Steps: 1 Home Layout: One level Home Equipment: Cane - single point;Walker - 2 wheels;Shower seat      Prior Function Level of Independence: Independent         Comments: planted tomatoes     Hand Dominance        Extremity/Trunk Assessment   Upper Extremity Assessment: Defer to OT evaluation           Lower Extremity Assessment: RLE deficits/detail RLE Deficits / Details: able to raise leg from the bed., knee flexion about 45 degrees.    Cervical / Trunk Assessment: Kyphotic  Communication   Communication: No difficulties  Cognition Arousal/Alertness: Awake/alert Behavior During Therapy: WFL for tasks assessed/performed Overall Cognitive Status: Within Functional Limits for tasks assessed                      General Comments      Exercises        Assessment/Plan    PT Assessment Patient needs continued PT services  PT Diagnosis Difficulty walking;Acute pain   PT Problem List Decreased strength;Decreased range of motion;Decreased activity tolerance;Decreased mobility;Decreased knowledge of use of DME;Decreased safety awareness;Decreased knowledge of precautions;Pain  PT Treatment Interventions DME instruction;Gait training;Stair training;Functional mobility training;Therapeutic activities;Therapeutic exercise;Patient/family education   PT Goals (Current goals can be found in the  Care Plan section) Acute Rehab PT Goals Patient Stated Goal: to walk and garden PT Goal Formulation: With patient/family Time For Goal Achievement: 05/18/16 Potential to Achieve Goals: Good    Frequency 7X/week   Barriers to discharge        Co-evaluation               End of Session   Activity Tolerance: Patient tolerated treatment well Patient left: in bed;with call bell/phone within reach;with bed  alarm set;with family/visitor present Nurse Communication: Mobility status         Time: [id number] PT Time Calculation (min) (ACUTE ONLY): 17 min   Charges:   PT Evaluation $PT Eval Low Complexity: 1 Procedure     PT G CodesRada Hay:        Tracker Mance Elizabeth 05/14/2016, 5:31 PM Blanchard KelchKaren Valin Massie PT [phone]

## 2016-05-14 NOTE — Transfer of Care (Signed)
Immediate Anesthesia Transfer of Care Note  Patient: Kim Nichols  Procedure(s) Performed: Procedure(s): RIGHT TOTAL KNEE ARTHROPLASTY (Right)  Patient Location: PACU  Anesthesia Type:Spinal  Level of Consciousness:  sedated, patient cooperative and responds to stimulation  Airway & Oxygen Therapy:Patient Spontanous Breathing and Patient connected to face mask oxgen  Post-op Assessment:  Report given to PACU RN and Post -op Vital signs reviewed and stable  Post vital signs:  Reviewed and stable  Last Vitals:  Filed Vitals:   05/14/16 0707  BP: 151/64  Pulse: 64  Temp: 36.4 C  Resp: 18    Complications: No apparent anesthesia complications

## 2016-05-14 NOTE — Interval H&P Note (Signed)
History and Physical Interval Note:  05/14/2016 8:58 AM  Kim Nichols  has presented today for surgery, with the diagnosis of RIGHT KNEE OA  The various methods of treatment have been discussed with the patient and family. After consideration of risks, benefits and other options for treatment, the patient has consented to  Procedure(s): RIGHT TOTAL KNEE ARTHROPLASTY (Right) as a surgical intervention .  The patient's history has been reviewed, patient examined, no change in status, stable for surgery.  I have reviewed the patient's chart and labs.  Questions were answered to the patient's satisfaction.     Shelda PalLIN,Aarik Blank D

## 2016-05-14 NOTE — Op Note (Signed)
NAMECathryn Nichols                      MEDICAL RECORD NO.:  [id number]                             FACILITY:  [facility]      PHYSICIAN:  Madlyn Frankel. Charlann Boxer, M.D.  DATE OF BIRTH:  July 27, 1929      DATE OF PROCEDURE:  05/14/2016                                     OPERATIVE REPORT         PREOPERATIVE DIAGNOSIS:  Right knee osteoarthritis.      POSTOPERATIVE DIAGNOSIS:  Right knee osteoarthritis.      FINDINGS:  The patient was noted to have complete loss of cartilage and   bone-on-bone arthritis with associated osteophytes in the lateral and patellofemoral compartments of   the knee with a significant synovitis and associated effusion.      PROCEDURE:  Right total knee replacement.      COMPONENTS USED:  [employer] Sigma rotating platform posterior stabilized knee   system, a size 4N femur, 3 tibia, size 10 mm PS insert, and 38 patellar   button.      SURGEON:  Madlyn Frankel. Charlann Boxer, M.D.      ASSISTANT:  Lanney Gins, PA-C.      ANESTHESIA:  Spinal.      SPECIMENS:  None.      COMPLICATION:  None.      DRAINS: None.  EBL: <50cc      TOURNIQUET TIME:   Total Tourniquet Time Documented: Thigh (Right) - 31 minutes Total: Thigh (Right) - 31 minutes  .      The patient was stable to the recovery room.      INDICATION FOR PROCEDURE:  Kim Nichols is a 82 y.o. female patient of   mine.  The patient had been seen, evaluated, and treated conservatively in the   office with medication, activity modification, and injections.  The patient had   radiographic changes of bone-on-bone arthritis with endplate sclerosis and osteophytes noted.      The patient failed conservative measures including medication, injections, and activity modification, and at this point was ready for more definitive measures.   Based on the radiographic changes and failed conservative measures, the patient   decided to proceed with total knee replacement.  Risks of infection,   DVT, component failure, need for  revision surgery, postop course, and   expectations were all   discussed and reviewed.  Consent was obtained for benefit of pain   relief.      PROCEDURE IN DETAIL:  The patient was brought to the operative theater.   Once adequate anesthesia, preoperative antibiotics, 2 gm of Ancef, 10 mg of Decadron (2 gm of Tranexamic Acid applied as topical form) administered, the patient was positioned supine with the right thigh tourniquet placed.  The  right lower extremity was prepped and draped in sterile fashion.  A time-   out was performed identifying the patient, planned procedure, and   extremity.      The right lower extremity was placed in the [facility] leg holder.  The leg was   exsanguinated, tourniquet elevated to 250 mmHg.  A midline incision was   made followed by  median parapatellar arthrotomy.  Following initial   exposure, attention was first directed to the patella.  Precut   measurement was noted to be 21 mm.  I resected down to 14 mm and used a   38 patellar button to restore patellar height as well as cover the cut   surface.      The lug holes were drilled and a metal shim was placed to protect the   patella from retractors and saw blades.      At this point, attention was now directed to the femur.  The femoral   canal was opened with a drill, irrigated to try to prevent fat emboli.  An   intramedullary rod was passed at 3 degrees valgus, 10 mm of bone was   resected off the distal femur.  Following this resection, the tibia was   subluxated anteriorly.  Using the extramedullary guide, 2 mm of bone was resected off   the proximal lateral tibia.  We confirmed the gap would be   stable medially and laterally with a 10 mm insert as well as confirmed   the cut was perpendicular in the coronal plane, checking with an alignment rod.      Once this was done, I sized the femur to be a size 4 in the anterior-   posterior dimension, chose a narrow component based on medial and    lateral dimension.  The size 4 rotation block was then pinned in   position anterior referenced using the C-clamp to set rotation.  The   anterior, posterior, and  chamfer cuts were made without difficulty nor   notching making certain that I was along the anterior cortex to help   with flexion gap stability.      The final box cut was made off the lateral aspect of distal femur.      At this point, the tibia was sized to be a size 3, the size 3 tray was   then pinned in position through the medial third of the tubercle,   drilled, and keel punched.  Trial reduction was now carried with a 4N femur,  3 tibia, a size 10 mm insert, and the 38 patella botton.  The knee was brought to   extension, full extension with good flexion stability with the patella   tracking through the trochlea without application of pressure.  Given   all these findings, the trial components removed.  Final components were   opened and cement was mixed.  The knee was irrigated with normal saline   solution and pulse lavage.  The synovial lining was   then injected with 30 cc of 0.25% Marcaine with epinephrine and 1 cc of Toradol plus 30 cc of NS for a   total of 61 cc.      The knee was irrigated.  Final implants were then cemented onto clean and   dried cut surfaces of bone with the knee brought to extension with a size 10 mm trial insert.      Once the cement had fully cured, the excess cement was removed   throughout the knee.  I confirmed I was satisfied with the range of   motion and stability, and the final size 10 mm PS insert was chosen.  It was   placed into the knee.      The tourniquet had been let down at 31 minutes.  No significant   hemostasis required.  The   extensor mechanism  was then reapproximated using #1 Vicryl and #0 V-lock sutures with the knee   in flexion.  The   remaining wound was closed with 2-0 Vicryl and running 4-0 Monocryl.   The knee was cleaned, dried, dressed sterilely using  Dermabond and   Aquacel dressing.  The patient was then   brought to recovery room in stable condition, tolerating the procedure   well.   Please note that Physician Assistant, Lanney Gins, PA-C, was present for the entirety of the case, and was utilized for pre-operative positioning, peri-operative retractor management, general facilitation of the procedure.  He was also utilized for primary wound closure at the end of the case.              Madlyn Frankel Charlann Boxer, M.D.    05/14/2016 11:23 AM

## 2016-05-14 NOTE — Anesthesia Preprocedure Evaluation (Addendum)
Anesthesia Evaluation  Patient identified by MRN, date of birth, ID band Patient awake    Reviewed: Allergy & Precautions, NPO status , Patient's Chart, lab work & pertinent test results  Airway Mallampati: II  TM Distance: >3 FB Neck ROM: Full    Dental no notable dental hx.    Pulmonary pneumonia,    Pulmonary exam normal breath sounds clear to auscultation       Cardiovascular hypertension, Pt. on medications + Peripheral Vascular Disease  Normal cardiovascular exam Rhythm:Regular Rate:Normal     Neuro/Psych negative neurological ROS  negative psych ROS   GI/Hepatic negative GI ROS, Neg liver ROS,   Endo/Other  diabetes  Renal/GU negative Renal ROS     Musculoskeletal  (+) Arthritis ,   Abdominal   Peds  Hematology negative hematology ROS (+) anemia ,   Anesthesia Other Findings   Reproductive/Obstetrics negative OB ROS                           Anesthesia Physical Anesthesia Plan  ASA: III  Anesthesia Plan: Spinal   Post-op Pain Management:    Induction:   Airway Management Planned:   Additional Equipment:   Intra-op Plan:   Post-operative Plan:   Informed Consent: I have reviewed the patients History and Physical, chart, labs and discussed the procedure including the risks, benefits and alternatives for the proposed anesthesia with the patient or authorized representative who has indicated his/her understanding and acceptance.   Dental advisory given  Plan Discussed with: CRNA  Anesthesia Plan Comments:        Anesthesia Quick Evaluation

## 2016-05-15 LAB — GLUCOSE, CAPILLARY
GLUCOSE-CAPILLARY: 115 mg/dL — AB (ref 65–99)
GLUCOSE-CAPILLARY: 130 mg/dL — AB (ref 65–99)
GLUCOSE-CAPILLARY: 128 mg/dL — AB (ref 65–99)
GLUCOSE-CAPILLARY: 117 mg/dL — AB (ref 65–99)

## 2016-05-15 LAB — CBC
HCT: 28.2 % — ABNORMAL LOW (ref 36.0–46.0)
Hemoglobin: 9.2 g/dL — ABNORMAL LOW (ref 12.0–15.0)
MCH: 30.5 pg (ref 26.0–34.0)
MCHC: 32.6 g/dL (ref 30.0–36.0)
MCV: 93.4 fL (ref 78.0–100.0)
PLATELETS: 177 10*3/uL (ref 150–400)
RBC: 3.02 MIL/uL — ABNORMAL LOW (ref 3.87–5.11)
RDW: 12.7 % (ref 11.5–15.5)
WBC: 10.7 10*3/uL — ABNORMAL HIGH (ref 4.0–10.5)

## 2016-05-15 LAB — BASIC METABOLIC PANEL
Anion gap: 3 — ABNORMAL LOW (ref 5–15)
BUN: 19 mg/dL (ref 6–20)
CO2: 29 mmol/L (ref 22–32)
CREATININE: 0.87 mg/dL (ref 0.44–1.00)
Calcium: 8.2 mg/dL — ABNORMAL LOW (ref 8.9–10.3)
Chloride: 106 mmol/L (ref 101–111)
GFR calc Af Amer: >60 mL/min (ref >60)
GFR, EST NON AFRICAN AMERICAN: 59 mL/min — AB (ref >60)
GLUCOSE: 194 mg/dL — AB (ref 65–99)
POTASSIUM: 4.8 mmol/L (ref 3.5–5.1)
Sodium: 138 mmol/L (ref 135–145)

## 2016-05-15 MED ORDER — FERROUS SULFATE 325 (65 FE) MG PO TABS: 325.0000 mg | ORAL_TABLET | Freq: Three times a day (TID) | ORAL | Status: DC

## 2016-05-15 MED ORDER — POLYETHYLENE GLYCOL 3350 17 G PO PACK: 17.0000 g | PACK | Freq: Two times a day (BID) | ORAL | Status: DC

## 2016-05-15 MED ORDER — TRAMADOL HCL 50 MG PO TABS
50.0000 mg | ORAL_TABLET | Freq: Four times a day (QID) | ORAL | Status: DC | PRN
  Administered 2016-05-15 (×2): 50 mg via ORAL
  Administered 2016-05-15: 100 mg via ORAL
  Administered 2016-05-16: 50 mg via ORAL
  Filled 2016-05-15: qty 1
  Filled 2016-05-15: qty 2
  Filled 2016-05-15: qty 1
  Filled 2016-05-15: qty 2

## 2016-05-15 MED ORDER — SODIUM CHLORIDE 0.9 % IV BOLUS (SEPSIS)
250.0000 mL | Freq: Once | INTRAVENOUS | Status: AC
  Administered 2016-05-15: 250 mL via INTRAVENOUS

## 2016-05-15 MED ORDER — TRAMADOL HCL 50 MG PO TABS: 50.0000 mg | ORAL_TABLET | Freq: Four times a day (QID) | ORAL | Status: DC | PRN

## 2016-05-15 MED ORDER — DOCUSATE SODIUM 100 MG PO CAPS: 100.0000 mg | ORAL_CAPSULE | Freq: Two times a day (BID) | ORAL | Status: DC

## 2016-05-15 MED ORDER — ASPIRIN EC 325 MG PO TBEC: 325.0000 mg | DELAYED_RELEASE_TABLET | Freq: Two times a day (BID) | ORAL | Status: AC

## 2016-05-15 MED ORDER — TIZANIDINE HCL 4 MG PO TABS: 4.0000 mg | ORAL_TABLET | Freq: Four times a day (QID) | ORAL | Status: DC | PRN

## 2016-05-15 MED ORDER — RIVAROXABAN 10 MG PO TABS: 10.0000 mg | ORAL_TABLET | ORAL | Status: DC

## 2016-05-15 NOTE — Progress Notes (Signed)
Physical Therapy Treatment Patient Details Name: Kim Kim Nichols Kim Nichols MRN: [id number] DOB: 03-Jun-1929 Today's Date: 05/15/2016    History of Present Illness  RTKA    PT Comments    POD # 1 pm session Assisted from recliner back to bed and performed TKR TE's followed by ICE.  Follow Up Recommendations  Home health PT;Supervision/Assistance - 24 hour     Equipment Recommendations  None recommended by PT    Recommendations for Other Services       Precautions / Restrictions Precautions Precautions: Fall;Knee Precaution Comments: monitor BP's Restrictions Weight Bearing Restrictions: No Other Position/Activity Restrictions: WBAT    Mobility  Bed Mobility Overal bed mobility: Needs Assistance Bed Mobility: Supine to Sit     Supine to sit: Min assist;Mod assist     General bed mobility comments: assisted back to bed  Transfers Overall transfer level: Needs assistance Equipment used: Rolling walker (2 wheeled) Transfers: Sit to/from [employer] CorporationStand;Stand Pivot Transfers Sit to Stand: Min assist;Mod assist Stand pivot transfers: Min assist;Mod assist       General transfer comment: assisted from recliner to bed  Ambulation/Gait Ambulation/Gait assistance: +2 safety/equipment;Min assist;Mod assist Ambulation Distance (Feet): 2 Feet Assistive device: Rolling walker (2 wheeled) Gait Pattern/deviations: Step-to pattern;Decreased stance time - right Gait velocity: decreased   General Gait Details: only able tyo take a few steps back to bed due to increased c/o fatigue.   Stairs            Wheelchair Mobility    Modified Rankin (Stroke Patients Only)       Balance                                    Cognition Arousal/Alertness: Awake/alert Behavior During Therapy: WFL for tasks assessed/performed Overall Cognitive Status: Impaired/Different from baseline Area of Impairment: Memory     Memory: Decreased short-term memory         General  Comments: daughter reports a little confusion with pt    Exercises   Total Knee Replacement TE's 10 reps B LE ankle pumps 10 reps towel squeezes 10 reps knee presses 10 reps heel slides  10 reps SAQ's 10 reps SLR's 10 reps ABD Followed by ICE     General Comments        Pertinent Vitals/Pain Pain Assessment: Faces Faces Pain Scale: Hurts a little bit Pain Location: R knee Pain Descriptors / Indicators: Guarding;Grimacing Pain Intervention(s): Monitored during session;Premedicated before session;Repositioned;Ice applied    Home Living Family/patient expects to be discharged to:: Private residence Living Arrangements: Alone;Children Available Help at Discharge: Family         Home Equipment: Gilmer Morane - single point;Walker - 2 wheels;Shower seat;Grab bars - toilet Additional Comments: family is available for a little over a week    Prior Function Level of Independence: Independent          PT Goals (current goals can now be found in the care plan section) Acute Rehab PT Goals Patient Stated Goal: to walk and garden Progress towards PT goals: Progressing toward goals    Frequency  7X/week    PT Plan Current plan remains appropriate    Co-evaluation             End of Session Equipment Utilized During Treatment: Gait belt Activity Tolerance: Other (comment) Patient left: in chair;with call bell/phone within reach;with chair alarm set;with family/visitor present     Time: 1346-1410 PT Time  Calculation (min) (ACUTE ONLY): 24 min  Charges:   $Therapeutic Exercise: 8-22 mins $Therapeutic Activity: 8-22 mins                    G Codes:      Felecia Shelling  PTA [facility]  Acute  Rehab Pager      [phone]

## 2016-05-15 NOTE — Discharge Instructions (Addendum)
INSTRUCTIONS AFTER JOINT REPLACEMENT  ° °o Remove items at home which could result in a fall. This includes throw rugs or furniture in walking pathways °o ICE to the affected joint every three hours while awake for 30 minutes at a time, for at least the first 3-5 days, and then as needed for pain and swelling.  Continue to use ice for pain and swelling. You may notice swelling that will progress down to the foot and ankle.  This is normal after surgery.  Elevate your leg when you are not up walking on it.   °o Continue to use the breathing machine you got in the hospital (incentive spirometer) which will help keep your temperature down.  It is common for your temperature to cycle up and down following surgery, especially at night when you are not up moving around and exerting yourself.  The breathing machine keeps your lungs expanded and your temperature down. ° ° °DIET:  As you were doing prior to hospitalization, we recommend a well-balanced diet. ° °DRESSING / WOUND CARE / SHOWERING ° °Keep the surgical dressing until follow up.  The dressing is water proof, so you can shower without any extra covering.  IF THE DRESSING FALLS OFF or the wound gets wet inside, change the dressing with sterile gauze.  Please use good hand washing techniques before changing the dressing.  Do not use any lotions or creams on the incision until instructed by your surgeon.   ° °ACTIVITY ° °o Increase activity slowly as tolerated, but follow the weight bearing instructions below.   °o No driving for 6 weeks or until further direction given by your physician.  You cannot drive while taking narcotics.  °o No lifting or carrying greater than 10 lbs. until further directed by your surgeon. °o Avoid periods of inactivity such as sitting longer than an hour when not asleep. This helps prevent blood clots.  °o You may return to work once you are authorized by your doctor.  ° ° ° °WEIGHT BEARING  ° °Weight bearing as tolerated with assist  device (walker, cane, etc) as directed, use it as long as suggested by your surgeon or therapist, typically at least 4-6 weeks. ° ° °EXERCISES ° °Results after joint replacement surgery are often greatly improved when you follow the exercise, range of motion and muscle strengthening exercises prescribed by your doctor. Safety measures are also important to protect the joint from further injury. Any time any of these exercises cause you to have increased pain or swelling, decrease what you are doing until you are comfortable again and then slowly increase them. If you have problems or questions, call your caregiver or physical therapist for advice.  ° °Rehabilitation is important following a joint replacement. After just a few days of immobilization, the muscles of the leg can become weakened and shrink (atrophy).  These exercises are designed to build up the tone and strength of the thigh and leg muscles and to improve motion. Often times heat used for twenty to thirty minutes before working out will loosen up your tissues and help with improving the range of motion but do not use heat for the first two weeks following surgery (sometimes heat can increase post-operative swelling).  ° °These exercises can be done on a training (exercise) mat, on the floor, on a table or on a bed. Use whatever works the best and is most comfortable for you.    Use music or television while you are exercising so that   the exercises are a pleasant break in your day. This will make your life better with the exercises acting as a break in your routine that you can look forward to.   Perform all exercises about fifteen times, three times per day or as directed.  You should exercise both the operative leg and the other leg as well. ° °Exercises include: °  °• Quad Sets - Tighten up the muscle on the front of the thigh (Quad) and hold for 5-10 seconds.   °• Straight Leg Raises - With your knee straight (if you were given a brace, keep it on),  lift the leg to 60 degrees, hold for 3 seconds, and slowly lower the leg.  Perform this exercise against resistance later as your leg gets stronger.  °• Leg Slides: Lying on your back, slowly slide your foot toward your buttocks, bending your knee up off the floor (only go as far as is comfortable). Then slowly slide your foot back down until your leg is flat on the floor again.  °• Angel Wings: Lying on your back spread your legs to the side as far apart as you can without causing discomfort.  °• Hamstring Strength:  Lying on your back, push your heel against the floor with your leg straight by tightening up the muscles of your buttocks.  Repeat, but this time bend your knee to a comfortable angle, and push your heel against the floor.  You may put a pillow under the heel to make it more comfortable if necessary.  ° °A rehabilitation program following joint replacement surgery can speed recovery and prevent re-injury in the future due to weakened muscles. Contact your doctor or a physical therapist for more information on knee rehabilitation.  ° ° °CONSTIPATION ° °Constipation is defined medically as fewer than three stools per week and severe constipation as less than one stool per week.  Even if you have a regular bowel pattern at home, your normal regimen is likely to be disrupted due to multiple reasons following surgery.  Combination of anesthesia, postoperative narcotics, change in appetite and fluid intake all can affect your bowels.  ° °YOU MUST use at least one of the following options; they are listed in order of increasing strength to get the job done.  They are all available over the counter, and you may need to use some, POSSIBLY even all of these options:   ° °Drink plenty of fluids (prune juice may be helpful) and high fiber foods °Colace 100 mg by mouth twice a day  °Senokot for constipation as directed and as needed Dulcolax (bisacodyl), take with full glass of water  °Miralax (polyethylene glycol)  once or twice a day as needed. ° °If you have tried all these things and are unable to have a bowel movement in the first 3-4 days after surgery call either your surgeon or your primary doctor.   ° °If you experience loose stools or diarrhea, hold the medications until you stool forms back up.  If your symptoms do not get better within 1 week or if they get worse, check with your doctor.  If you experience "the worst abdominal pain ever" or develop nausea or vomiting, please contact the office immediately for further recommendations for treatment. ° ° °ITCHING:  If you experience itching with your medications, try taking only a single pain pill, or even half a pain pill at a time.  You can also use Benadryl over the counter for itching or also to   help with sleep.  ° °TED HOSE STOCKINGS:  Use stockings on both legs until for at least 2 weeks or as directed by physician office. They may be removed at night for sleeping. ° °MEDICATIONS:  See your medication summary on the “After Visit Summary” that nursing will review with you.  You may have some home medications which will be placed on hold until you complete the course of blood thinner medication.  It is important for you to complete the blood thinner medication as prescribed. ° °PRECAUTIONS:  If you experience chest pain or shortness of breath - call 911 immediately for transfer to the hospital emergency department.  ° °If you develop a fever greater that 101 F, purulent drainage from wound, increased redness or drainage from wound, foul odor from the wound/dressing, or calf pain - CONTACT YOUR SURGEON.   °                                                °FOLLOW-UP APPOINTMENTS:  If you do not already have a post-op appointment, please call the office for an appointment to be seen by your surgeon.  Guidelines for how soon to be seen are listed in your “After Visit Summary”, but are typically between 1-4 weeks after surgery. ° °OTHER INSTRUCTIONS:  ° °Knee  Replacement:  Do not place pillow under knee, focus on keeping the knee straight while resting.  ° °MAKE SURE YOU:  °• Understand these instructions.  °• Get help right away if you are not doing well or get worse.  ° ° °Thank you for letting us be a part of your medical care team.  It is a privilege we respect greatly.  We hope these instructions will help you stay on track for a fast and full recovery!  ° ° °Information on my medicine - XARELTO® (Rivaroxaban) ° °This medication education was reviewed with me or my healthcare representative as part of my discharge preparation.  The pharmacist that spoke with me during my hospital stay was:  Arcangel Minion M, RPH ° °Why was Xarelto® prescribed for you? °Xarelto® was prescribed for you to reduce the risk of blood clots forming after orthopedic surgery. The medical term for these abnormal blood clots is venous thromboembolism (VTE). ° °What do you need to know about xarelto® ? °Take your Xarelto® ONCE DAILY at the same time every day. °You may take it either with or without food. ° °If you have difficulty swallowing the tablet whole, you may crush it and mix in applesauce just prior to taking your dose. ° °Take Xarelto® exactly as prescribed by your doctor and DO NOT stop taking Xarelto® without talking to the doctor who prescribed the medication.  Stopping without other VTE prevention medication to take the place of Xarelto® may increase your risk of developing a clot. ° °After discharge, you should have regular check-up appointments with your healthcare provider that is prescribing your Xarelto®.   ° °What do you do if you miss a dose? °If you miss a dose, take it as soon as you remember on the same day then continue your regularly scheduled once daily regimen the next day. Do not take two doses of Xarelto® on the same day.  ° °Important Safety Information °A possible side effect of Xarelto® is bleeding. You should call your healthcare provider right away if you    experience any of the following: °? Bleeding from an injury or your nose that does not stop. °? Unusual colored urine (red or dark brown) or unusual colored stools (red or black). °? Unusual bruising for unknown reasons. °? A serious fall or if you hit your head (even if there is no bleeding). ° °Some medicines may interact with Xarelto® and might increase your risk of bleeding while on Xarelto®. To help avoid this, consult your healthcare provider or pharmacist prior to using any new prescription or non-prescription medications, including herbals, vitamins, non-steroidal anti-inflammatory drugs (NSAIDs) and supplements. ° °This website has more information on Xarelto®: www.xarelto.com. ° ° °

## 2016-05-15 NOTE — Progress Notes (Signed)
     Subjective: 1 Day Post-Op Procedure(s) (LRB): RIGHT TOTAL KNEE ARTHROPLASTY (Right)   Patient reports pain as mild, pain controlled.  Some increased pain last night, but it has resolved this morning. No other events throughout the night.  Ready to be discharged home if she does well with PT.   Objective:   VITALS:   Filed Vitals:   05/15/16 0250 05/15/16 0633  BP: 132/54 105/61  Pulse: 68 62  Temp: 97.8 F (36.6 C) 97.8 F (36.6 C)  Resp: 16 16    Dorsiflexion/Plantar flexion intact Incision: dressing C/D/I No cellulitis present Compartment soft  LABS  Recent Labs  05/15/16 0405  HGB 9.2*  HCT 28.2*  WBC 10.7*  PLT 177     Recent Labs  05/15/16 0405  NA 138  K 4.8  BUN 19  CREATININE 0.87  GLUCOSE 194*     Assessment/Plan: 1 Day Post-Op Procedure(s) (LRB): RIGHT TOTAL KNEE ARTHROPLASTY (Right) Foley cath d/c'ed Advance diet Up with therapy D/C IV fluids Discharge home with home health  [facility]. Follow up with OLIN,Aileena Iglesia D in 2 weeks.  Contact information:  [facility] [address], [address] [place] [id number] [phone]         Anastasio AuerbachMatthew S. Michie Molnar   PAC  05/15/2016, 9:08 AM

## 2016-05-15 NOTE — Evaluation (Signed)
Occupational Therapy Evaluation Patient Details Name: Kim Nichols MRN: [id number] DOB: 09-09-1929 Today's Date: 05/15/2016    History of Present Illness  RTKA   Clinical Impression   This 82 year old female was admitted for the above surgery.  She was independent with adls prior to admission and will benefit from continued OT. Goals in acute are for supervision to min guard.  Pt currently needs up to mod A for adls and min A for toilet transfers.    Follow Up Recommendations  Supervision/Assistance - 24 hour    Equipment Recommendations   (likely none; pt has high commode and grab bar)    Recommendations for Other Services       Precautions / Restrictions Precautions Precautions: Fall;Knee Restrictions Weight Bearing Restrictions: No      Mobility Bed Mobility               General bed mobility comments: oob  Transfers   Equipment used: Rolling walker (2 wheeled) Transfers: Sit to/from Stand Sit to Stand: Min assist         General transfer comment: cues for hand and R leg position    Balance                                            ADL Overall ADL's : Needs assistance/impaired     Grooming: Wash/dry hands;Standing;Min guard   Upper Body Bathing: Set up;Sitting   Lower Body Bathing: Minimal assistance;Sit to/from stand   Upper Body Dressing : Minimal assistance;Sitting (iv)   Lower Body Dressing: Moderate assistance;Sit to/from stand   Toilet Transfer: Minimal assistance;Ambulation;BSC;RW   Toileting- Architect and Hygiene: Min guard;Sit to/from stand         General ADL Comments: pt ambulated to bathroom; kept her on 1 liter of 02--sats 94-95%.  Safety cues given:  She was trying to reach out and open door further, encouraged one hand on RW when pulling pants up, and cued her to step up close to sink when washing her hands     Vision     Perception     Praxis      Pertinent Vitals/Pain Pain  Assessment: Faces Faces Pain Scale: Hurts a little bit Pain Location: R knee Pain Descriptors / Indicators: Aching Pain Intervention(s): Limited activity within patient's tolerance;Monitored during session;Premedicated before session;Repositioned;Ice applied     Hand Dominance     Extremity/Trunk Assessment Upper Extremity Assessment Upper Extremity Assessment: Overall WFL for tasks assessed           Communication Communication Communication: No difficulties   Cognition Arousal/Alertness: Awake/alert Behavior During Therapy: WFL for tasks assessed/performed Overall Cognitive Status: Impaired/Different from baseline Area of Impairment: Memory     Memory: Decreased short-term memory         General Comments: daughter reports a little confusion with pt   General Comments       Exercises       Shoulder Instructions      Home Living Family/patient expects to be discharged to:: Private residence Living Arrangements: Alone;Children Available Help at Discharge: Family               Bathroom Shower/Tub: Walk-in shower   Bathroom Toilet: Handicapped height     Home Equipment: Cane - single point;Walker - 2 wheels;Shower seat;Grab bars - toilet   Additional Comments: family is available for a little  over a week      Prior Functioning/Environment Level of Independence: Independent             OT Diagnosis: Generalized weakness;Acute pain   OT Problem List: Decreased strength;Decreased activity tolerance;Decreased safety awareness;Decreased knowledge of use of DME or AE;Pain   OT Treatment/Interventions: Self-care/ADL training;DME and/or AE instruction;Patient/family education    OT Goals(Current goals can be found in the care plan section) Acute Rehab OT Goals Patient Stated Goal: to walk and garden OT Goal Formulation: With patient Time For Goal Achievement: 05/22/16 Potential to Achieve Goals: Good ADL Goals Pt Will Perform Lower Body Bathing:  with supervision;with adaptive equipment;sit to/from stand Pt Will Perform Lower Body Dressing: with supervision;with adaptive equipment;sit to/from stand (pants with reacher) Pt Will Transfer to Toilet: bedside commode;ambulating;with min guard assist Pt Will Perform Toileting - Clothing Manipulation and hygiene: with supervision;sit to/from stand Pt Will Perform Tub/Shower Transfer: Shower transfer;with min guard assist;ambulating;shower seat  OT Frequency: Min 2X/week   Barriers to D/C:            Co-evaluation              End of Session    Activity Tolerance: Patient tolerated treatment well Patient left: in chair;with call bell/phone within reach;with chair alarm set   Time: [id number] OT Time Calculation (min): 31 min Charges:  OT General Charges $OT Visit: 1 Procedure OT Evaluation $OT Eval Low Complexity: 1 Procedure OT Treatments $Self Care/Home Management : 8-22 mins G-Codes:    Kim Nichols 05/15/2016, 2:44 PM  Kim Nichols, OTR/L [phone] 05/15/2016

## 2016-05-15 NOTE — Progress Notes (Signed)
OT Cancellation Note  Patient Details Name: Cathlean MarseillesMable Broom MRN: [id number] DOB: Oct 14, 1929   Cancelled Treatment:    Reason Eval/Treat Not Completed: Medical issues which prohibited therapy. Pt with low BP. Spoke with RN and daughter will check on pt later in the day.  Lise AuerLori Myrth Dahan, [place] [phone]  Einar CrowEDDING, [place] Paster D 05/15/2016, 10:21 AM

## 2016-05-15 NOTE — Progress Notes (Signed)
Physical Therapy Treatment Patient Details Name: Kim MarseillesMable Nichols MRN: [id number] DOB: Jun 30, 1929 Today's Date: 05/15/2016    History of Present Illness  RTKA    PT Comments    POD # 1 am session Tx after IV bolus.  Orthostatic BP's taken.  See vital in EPIC.  [facility] then [facility] in hallway + 2 assist for safety such that recliner was following.    Follow Up Recommendations  Home health PT;Supervision/Assistance - 24 hour     Equipment Recommendations  None recommended by PT    Recommendations for Other Services       Precautions / Restrictions Precautions Precautions: Fall;Knee Precaution Comments: monitor BP's Restrictions Weight Bearing Restrictions: No Other Position/Activity Restrictions: WBAT    Mobility  Bed Mobility Overal bed mobility: Needs Assistance Bed Mobility: Supine to Sit     Supine to sit: Min assist;Mod assist     General bed mobility comments: increased time and repeat VC's to stay on task.  Transfers Overall transfer level: Needs assistance Equipment used: Rolling walker (2 wheeled) Transfers: Sit to/from [employer]GI CorporationStand;Stand Pivot Transfers Sit to Stand: Min assist;Mod assist Stand pivot transfers: Min assist;Mod assist       General transfer comment[facility] VC's on proper tech, hand transfer and turn completion.    Ambulation/Gait Ambulation/Gait assistance: +2 safety/equipment;Min assist;Mod assist Ambulation Distance (Feet): 25 Feet Assistive device: Rolling walker (2 wheeled) Gait Pattern/deviations: Step-to pattern;Decreased stance time - right Gait velocity: decreased   General Gait Details: very unsteady gait requiring 75% VC's on proper walker to self distance and safety with tuns.     Stairs            Wheelchair Mobility    Modified Rankin (Stroke Patients Only)       Balance                                    Cognition Arousal/Alertness:  Awake/alert Behavior During Therapy: WFL for tasks assessed/performed Overall Cognitive Status: Impaired/Different from baseline Area of Impairment: Memory     Memory: Decreased short-term memory         General Comments: daughter reports a little confusion with pt    Exercises      General Comments        Pertinent Vitals/Pain Pain Assessment: Faces Faces Pain Scale: Hurts a little bit Pain Location: R knee Pain Descriptors / Indicators: Guarding;Grimacing Pain Intervention(s): Monitored during session;Premedicated before session;Repositioned;Ice applied    Home Living Family/patient expects to be discharged to:: Private residence Living Arrangements: Alone;Children Available Help at Discharge: Family         Home Equipment: Gilmer Morane - single point;Walker - 2 wheels;Shower seat;Grab bars - toilet Additional Comments: family is available for a little over a week    Prior Function Level of Independence: Independent          PT Goals (current goals can now be found in the care plan section) Acute Rehab PT Goals Patient Stated Goal: to walk and garden Progress towards PT goals: Progressing toward goals    Frequency  7X/week    PT Plan Current plan remains appropriate    Co-evaluation             End of Session Equipment Utilized During Treatment: Gait belt Activity Tolerance: Other (comment) Patient left: in chair;with call bell/phone within reach;with chair alarm set;with family/visitor present  Time: 6045-4098 PT Time Calculation (min) (ACUTE ONLY): 45 min  Charges:  $Gait Training: 8-22 mins $Therapeutic Activity: 23-37 mins                    G Codes:      Felecia Shelling  PTA [facility]  Acute  Rehab Pager      [phone]

## 2016-05-15 NOTE — Care Management Note (Signed)
Case Management Note  Patient Details  Name: Kim Nichols MRN: [id number] Date of Birth: [birth year]  Subjective/Objective: 82 y/o f admitted w/OA R knee. Pod#1 R tka. From home. Kim Nichols already following pta-rep Kim Nichols aware of d/c.                   Action/Plan:d/c plan home w/HHC.   Expected Discharge Date:                  Expected Discharge Plan:  Home w Home Health Services  In-House Referral:     Discharge planning Services  CM Consult  Post Acute Care Choice:    Choice offered to:  Patient  DME Arranged:    DME Agency:     HH Arranged:  PT HH Agency:  [facility]  Status of Service:  In process, will continue to follow  Medicare Important Message Given:    Date Medicare IM Given:    Medicare IM give by:    Date Additional Medicare IM Given:    Additional Medicare Important Message give by:     If discussed at Long Length of Stay Meetings, dates discussed:    Additional Comments:  Kim Nichols, Kim Holleman, RN 05/15/2016, 11:02 AM

## 2016-05-16 LAB — CBC
HEMATOCRIT: 27 % — AB (ref 36.0–46.0)
HEMOGLOBIN: 8.6 g/dL — AB (ref 12.0–15.0)
MCH: 30.2 pg (ref 26.0–34.0)
MCHC: 31.9 g/dL (ref 30.0–36.0)
MCV: 94.7 fL (ref 78.0–100.0)
Platelets: 164 10*3/uL (ref 150–400)
RBC: 2.85 MIL/uL — ABNORMAL LOW (ref 3.87–5.11)
RDW: 13.1 % (ref 11.5–15.5)
WBC: 7.8 10*3/uL (ref 4.0–10.5)

## 2016-05-16 LAB — BASIC METABOLIC PANEL
ANION GAP: 3 — AB (ref 5–15)
BUN: 18 mg/dL (ref 6–20)
CALCIUM: 8.3 mg/dL — AB (ref 8.9–10.3)
CHLORIDE: 107 mmol/L (ref 101–111)
CO2: 29 mmol/L (ref 22–32)
CREATININE: 0.89 mg/dL (ref 0.44–1.00)
GFR calc non Af Amer: 57 mL/min — ABNORMAL LOW (ref >60)
Glucose, Bld: 138 mg/dL — ABNORMAL HIGH (ref 65–99)
Potassium: 4.5 mmol/L (ref 3.5–5.1)
SODIUM: 139 mmol/L (ref 135–145)

## 2016-05-16 LAB — GLUCOSE, CAPILLARY: GLUCOSE-CAPILLARY: 126 mg/dL — AB (ref 65–99)

## 2016-05-16 NOTE — Progress Notes (Signed)
Occupational Therapy Treatment Patient Details Name: Kim MarseillesMable Nichols MRN: [id number] DOB: Dec 14, 1929 Today's Date: 05/16/2016    History of present illness  RTKA   OT comments  Patient progressing towards OT goals. Still needs assistance with all mobility/ADLs at this time. Daughters present and supportive.   Follow Up Recommendations  Supervision/Assistance - 24 hour    Equipment Recommendations  None recommended by OT    Recommendations for Other Services      Precautions / Restrictions Precautions Precautions: Fall;Knee Precaution Comments: monitor BP's Restrictions Weight Bearing Restrictions: No Other Position/Activity Restrictions: WBAT       Mobility Bed Mobility Overal bed mobility: Needs Assistance Bed Mobility: Supine to Sit     Supine to sit: Min assist     General bed mobility comments: cues for technique, assist for RLE  Transfers Overall transfer level: Needs assistance Equipment used: Rolling walker (2 wheeled) Transfers: Sit to/from Stand Sit to Stand: Min assist;From elevated surface         General transfer comment: assist to rise, cues for hand and foot placement    Balance                                   ADL Overall ADL's : Needs assistance/impaired     Grooming: Wash/dry hands;Set up;Sitting           Upper Body Dressing : Minimal assistance;Sitting   Lower Body Dressing: Moderate assistance;Sit to/from stand   Toilet Transfer: Min guard;Cueing for safety;Ambulation;BSC;RW Toilet Transfer Details (indicate cue type and reason): ambulated with RW to and from bathroom; used 3 in 1 over toilet Toileting- Clothing Manipulation and Hygiene: Minimal assistance;Sit to/from stand Toileting - Clothing Manipulation Details (indicate cue type and reason): posterior leaning while trying to perform hygiene; max cues for safety Tub/ Shower Transfer: Walk-in shower;Rolling walker;Shower seat   Functional mobility during  ADLs: Min guard;Minimal assistance;Rolling walker General ADL Comments: Answered all questions of patient and daughters who were present during session.      Vision                     Perception     Praxis      Cognition   Behavior During Therapy: WFL for tasks assessed/performed Overall Cognitive Status: Impaired/Different from baseline Area of Impairment: Memory     Memory: Decreased short-term memory          General Comments: daughter reports a little confusion with pt    Extremity/Trunk Assessment               Exercises     Shoulder Instructions       General Comments      Pertinent Vitals/ Pain       Pain Assessment: 0-10 Pain Score: 4  Pain Location: R knee Pain Descriptors / Indicators: Aching;Burning;Sore Pain Intervention(s): Limited activity within patient's tolerance;Monitored during session;Repositioned;Ice applied  Home Living                                          Prior Functioning/Environment              Frequency Min 2X/week     Progress Toward Goals  OT Goals(current goals can now be found in the care plan section)  Progress towards OT goals: Progressing toward goals  Acute Rehab OT Goals Patient Stated Goal: to walk and garden  Plan Discharge plan remains appropriate    Co-evaluation                 End of Session Equipment Utilized During Treatment: Rolling walker   Activity Tolerance Patient tolerated treatment well   Patient Left in chair;with call bell/phone within reach;with chair alarm set   Nurse Communication Mobility status        Time: 0930-1020 OT Time Calculation (min): 50 min  Charges: OT General Charges $OT Visit: 1 Procedure OT Treatments $Self Care/Home Management : 38-52 mins  Chanel Mcadams A 05/16/2016, 10:29 AM

## 2016-05-16 NOTE — Progress Notes (Signed)
     Subjective: 2 Days Post-Op Procedure(s) (LRB): RIGHT TOTAL KNEE ARTHROPLASTY (Right)   Patient reports pain as mild, pain controlled with current pain medications.  No events throughout the night.  Ready to be discharged home.  Objective:   VITALS:   Filed Vitals:   05/15/16 2135 05/16/16 0423  BP: 126/59 149/54  Pulse: 65 75  Temp: 98 F (36.7 C) 99.5 F (37.5 C)  Resp: 16 16    Dorsiflexion/Plantar flexion intact Incision: dressing C/D/I No cellulitis present Compartment soft  LABS  Recent Labs  05/15/16 0405 05/16/16 0411  HGB 9.2* 8.6*  HCT 28.2* 27.0*  WBC 10.7* 7.8  PLT 177 164     Recent Labs  05/15/16 0405 05/16/16 0411  NA 138 139  K 4.8 4.5  BUN 19 18  CREATININE 0.87 0.89  GLUCOSE 194* 138*     Assessment/Plan: 2 Days Post-Op Procedure(s) (LRB): RIGHT TOTAL KNEE ARTHROPLASTY (Right) Up with therapy Discharge home with home health  [facility] Follow up with OLIN,Carolie Mcilrath D in 2 weeks.  Contact information:  [facility] [address], [address] [place] [id number] [phone]         Anastasio AuerbachMatthew S. Eathen Budreau   PAC  05/16/2016, 9:47 AM

## 2016-05-16 NOTE — Progress Notes (Signed)
Physical Therapy Treatment Patient Details Name: Kim Nichols MRN: [id number] DOB: 1929-11-05 Today's Date: 05/16/2016    History of Present Illness  RTKA    PT Comments    POD # 2 am session which included family education Hands on family education with 2 family members to assist pt with amb, perform one step (stairs) twice, assisted in and out of bathroom and HEP following handout.  Advised family to always have "hnads on" assist when pt pt transfers/amb.  Still unsteady.    Follow Up Recommendations  Home health PT;Supervision/Assistance - 24 hour     Equipment Recommendations  None recommended by PT    Recommendations for Other Services       Precautions / Restrictions Precautions Precautions: Fall;Knee Precaution Comments: monitor BP's Restrictions Weight Bearing Restrictions: No Other Position/Activity Restrictions: WBAT    Mobility  Bed Mobility Overal bed mobility: Needs Assistance Bed Mobility: Supine to Sit     Supine to sit: Min assist     General bed mobility comments: Pt OOB in recliner  Transfers Overall transfer level: Needs assistance Equipment used: Rolling walker (2 wheeled) Transfers: Sit to/from Stand Sit to Stand: Min guard;Min assist         General transfer comment: assist to rise, cues for hand and foot placement plus increased time  Ambulation/Gait Ambulation/Gait assistance: Min guard;Min assist Ambulation Distance (Feet): 45 Feet Assistive device: Rolling walker (2 wheeled) Gait Pattern/deviations: Step-to pattern;Trunk flexed;Decreased stance time - right Gait velocity: decreased   General Gait Details: very unsteady gait.  Advised daughter to alway hands on assist when ever pt amb.     Stairs    one step forward with min assist and 25% VC's on proper walker placement and sequencing.          Wheelchair Mobility    Modified Rankin (Stroke Patients Only)       Balance                                    Cognition Arousal/Alertness: Awake/alert Behavior During Therapy: WFL for tasks assessed/performed Overall Cognitive Status: Within Functional Limits for tasks assessed Area of Impairment: Memory     Memory: Decreased short-term memory         General Comments: daughter reports a little confusion with pt    Exercises   Total Knee Replacement TE's 10 reps B LE ankle pumps 10 reps towel squeezes 10 reps knee presses 10 reps heel slides  10 reps SAQ's 10 reps SLR's 10 reps ABD Followed by ICE     General Comments        Pertinent Vitals/Pain Pain Assessment: 0-10 Pain Score: 5  Pain Location: R knee Pain Descriptors / Indicators: Grimacing;Discomfort;Sore Pain Intervention(s): Monitored during session;Premedicated before session;Repositioned;Ice applied    Home Living                      Prior Function            PT Goals (current goals can now be found in the care plan section) Acute Rehab PT Goals Patient Stated Goal: to walk and garden Progress towards PT goals: Progressing toward goals    Frequency  7X/week    PT Plan Current plan remains appropriate    Co-evaluation             End of Session Equipment Utilized During Treatment: Gait belt Activity Tolerance: Patient  tolerated treatment well Patient left: in chair;with call bell/phone within reach;with family/visitor present     Time: 1022-1104 PT Time Calculation (min) (ACUTE ONLY): 42 min  Charges:  $Gait Training: 8-22 mins $Therapeutic Exercise: 8-22 mins $Therapeutic Activity: 8-22 mins                    G Codes:      Felecia ShellingLori Minsa Weddington  PTA [facility]  Acute  Rehab Pager      [phone]

## 2016-05-20 NOTE — Discharge Summary (Signed)
Physician Discharge Summary  Patient ID: Kim MarseillesMable Nichols MRN: [id number] DOB/AGE: November 24, 1929 82 y.o.  Admit date: 05/14/2016 Discharge date: 05/16/2016   Procedures:  Procedure(s) (LRB): RIGHT TOTAL KNEE ARTHROPLASTY (Right)  Attending Physician:  Dr. Durene RomansMatthew Olin   Admission Diagnoses:   Right knee primary OA / pain  Discharge Diagnoses:  Principal Problem:   S/P right TKA Active Problems:   S/P knee replacement  Past Medical History  Diagnosis Date  . Hypertension   . Diabetes mellitus without complication (HCC)   . Dyslipidemia   . History of gastroesophageal reflux (GERD)   . Osteoarthritis   . Anxiety disorder   . Chronic insomnia   . Incontinence of urine   . Tooth infection     03-21-16 ISSUE RESOLVED  . Osteoporosis   . History of kidney stones     HPI:    Kim Nichols, 82 y.o. female, has a history of pain and functional disability in the right knee due to arthritis and has failed non-surgical conservative treatments for greater than 12 weeks to includecorticosteriod injections, use of assistive devices and activity modification. Onset of symptoms was gradual, starting 5+ years ago with gradually worsening course since that time. The patient noted prior procedures on the knee to include arthroscopy and menisectomy on the right knee(s). Patient currently rates pain in the right knee(s) at 9 out of 10 with activity. Patient has night pain, worsening of pain with activity and weight bearing, pain that interferes with activities of daily living, pain with passive range of motion, crepitus and joint swelling. Patient has evidence of periarticular osteophytes and joint space narrowing by imaging studies. There is no active infection. Risks, benefits and expectations were discussed with the patient. Risks including but not limited to the risk of anesthesia, blood clots, nerve damage, blood vessel damage, failure of the prosthesis, infection and up to and including death.  Patient understand the risks, benefits and expectations and wishes to proceed with surgery.   PCP: [name],[name], MD   Discharged Condition: good  Hospital Course:  Patient underwent the above stated procedure on 05/14/2016. Patient tolerated the procedure well and brought to the recovery room in good condition and subsequently to the floor.  POD #1 BP: 105/61 ; Pulse: 62 ; Temp: 97.8 F (36.6 C) ; Resp: 16 Patient reports pain as mild, pain controlled. Some increased pain last night, but it has resolved this morning. No other events throughout the night. Dorsiflexion/plantar flexion intact, incision: dressing C/D/I, no cellulitis present and compartment soft.   LABS  Basename    HGB     9.2  HCT     28.2   POD #2  BP: 149/54 ; Pulse: 75 ; Temp: 99.5 F (37.5 C) ; Resp: 16 Patient reports pain as mild, pain controlled with current pain medications. No events throughout the night. Ready to be discharged home. Dorsiflexion/plantar flexion intact, incision: dressing C/D/I, no cellulitis present and compartment soft.   LABS  Basename    HGB     8.6  HCT     27.0    Discharge Exam: General appearance: alert, cooperative and no distress Extremities: Homans sign is negative, no sign of DVT, no edema, redness or tenderness in the calves or thighs and no ulcers, gangrene or trophic changes  Disposition: Home with follow up in 2 weeks   Follow-up Information    Follow up with Shelda PalLIN,Talissa Apple D, MD. Schedule an appointment as soon as possible for a visit in 2 weeks.  Specialty:  Orthopedic Surgery   Contact information:   [address] [place] [address] [phone]       Discharge Instructions    Call MD / Call 911    Complete by:  As directed   If you experience chest pain or shortness of breath, CALL 911 and be transported to the hospital emergency room.  If you develope a fever above 101 F, pus (white drainage) or increased drainage or redness at the  wound, or calf pain, call your surgeon's office.     Change dressing    Complete by:  As directed   Maintain surgical dressing until follow up in the clinic. If the edges start to pull up, may reinforce with tape. If the dressing is no longer working, may remove and cover with gauze and tape, but must keep the area dry and clean.  Call with any questions or concerns.     Constipation Prevention    Complete by:  As directed   Drink plenty of fluids.  Prune juice may be helpful.  You may use a stool softener, such as Colace (over the counter) 100 mg twice a day.  Use MiraLax (over the counter) for constipation as needed.     Diet - low sodium heart healthy    Complete by:  As directed      Discharge instructions    Complete by:  As directed   Maintain surgical dressing until follow up in the clinic. If the edges start to pull up, may reinforce with tape. If the dressing is no longer working, may remove and cover with gauze and tape, but must keep the area dry and clean.  Follow up in 2 weeks at [facility]. Call with any questions or concerns.     Increase activity slowly as tolerated    Complete by:  As directed   Weight bearing as tolerated with assist device (walker, cane, etc) as directed, use it as long as suggested by your surgeon or therapist, typically at least 4-6 weeks.     TED hose    Complete by:  As directed   Use stockings (TED hose) for 2 weeks on both leg(s).  You may remove them at night for sleeping.             Medication List    STOP taking these medications        alendronate 70 MG tablet  Commonly known as:  FOSAMAX      TAKE these medications        aspirin EC 325 MG tablet  Take 1 tablet (325 mg total) by mouth 2 (two) times daily. Take for 4 weeks.  Start taking on:  05/30/2016     CALCIUM 600 + D PO  Take 600 mg by mouth daily.     chlorhexidine 0.12 % solution  Commonly known as:  PERIDEX  Use as directed 15 mLs in the mouth or throat 2  (two) times daily.     clonazePAM 1 MG tablet  Commonly known as:  KLONOPIN  Take 1 tablet (1 mg total) by mouth at bedtime.     cloNIDine 0.1 MG tablet  Commonly known as:  CATAPRES  Take 1 tablet (0.1 mg total) by mouth at bedtime.     docusate sodium 100 MG capsule  Commonly known as:  COLACE  Take 1 capsule (100 mg total) by mouth 2 (two) times daily.     DULoxetine 60 MG capsule  Commonly known as:  CYMBALTA  Take 1 capsule (60 mg total) by mouth daily.     enalapril 20 MG tablet  Commonly known as:  VASOTEC  Take 1 tablet (20 mg total) by mouth daily.     esomeprazole 40 MG capsule  Commonly known as:  NEXIUM  Take 1 capsule (40 mg total) by mouth daily before breakfast.     felodipine 5 MG 24 hr tablet  Commonly known as:  PLENDIL  Take 1 tablet (5 mg total) by mouth daily.     ferrous sulfate 325 (65 FE) MG tablet  Take 1 tablet (325 mg total) by mouth 3 (three) times daily after meals.     Fish Oil 1200 MG Caps  Take 1,200 mg by mouth daily.     gabapentin 300 MG capsule  Commonly known as:  NEURONTIN  TAKE ONE CAPSULE BY MOUTH THREE TIMES A DAY     hydroxypropyl methylcellulose / hypromellose 2.5 % ophthalmic solution  Commonly known as:  ISOPTO TEARS / GONIOVISC  Place 1 drop into both eyes 2 (two) times daily.     metFORMIN 500 MG tablet  Commonly known as:  GLUCOPHAGE  Take 500 mg by mouth 2 (two) times daily with a meal.     MULTIPLE VITAMINS-MINERALS ER PO  Take 1 tablet by mouth daily.     beta carotene w/minerals tablet  Take 1 tablet by mouth daily.     polyethylene glycol packet  Commonly known as:  MIRALAX / GLYCOLAX  Take 17 g by mouth 2 (two) times daily.     rivaroxaban 10 MG Tabs tablet  Commonly known as:  XARELTO  Take 1 tablet (10 mg total) by mouth daily.     simvastatin 40 MG tablet  Commonly known as:  ZOCOR  Take 1 tablet (40 mg total) by mouth at bedtime.     tiZANidine 4 MG tablet  Commonly known as:  ZANAFLEX  Take  1 tablet (4 mg total) by mouth every 6 (six) hours as needed for muscle spasms.     traMADol 50 MG tablet  Commonly known as:  ULTRAM  Take 1-2 tablets (50-100 mg total) by mouth every 6 (six) hours as needed for moderate pain or severe pain.         Signed: Anastasio Auerbach. Asta Corbridge   PA-C  05/20/2016, 8:03 AM

## 2016-07-11 ENCOUNTER — Ambulatory Visit: Payer: Medicare Other

## 2016-11-27 ENCOUNTER — Ambulatory Visit
Admission: RE | Admit: 2016-11-27 | Discharge: 2016-11-27 | Disposition: A | Discharge status: Home / Self Care | Payer: Medicare Other | Source: Ambulatory Visit | Attending: Nurse Practitioner | Admitting: Nurse Practitioner

## 2016-11-27 ENCOUNTER — Other Ambulatory Visit: Payer: Self-pay | Admitting: Nurse Practitioner

## 2016-11-27 DIAGNOSIS — J069 Acute upper respiratory infection, unspecified: Secondary | ICD-10-CM

## 2017-05-01 ENCOUNTER — Encounter: Payer: Self-pay | Admitting: Neurology

## 2017-05-20 NOTE — Progress Notes (Addendum)
Kim Nichols was seen today in the movement disorders clinic for neurologic consultation at the request of Kim Nichols, [name], MD.  This patient is accompanied in the office by her spouse who supplements the history. The consultation is for the evaluation of tremor.  She has previously seen Dr. Marjory Nichols in 2014 and I reviewed his records.  He suspected pt had PD and it looks like she was given carbidopa/levodopa 25/100 but she never followed back up with him.  Specific Symptoms:  Tremor: Yes.   (at least since 2014 in the R hand as noted in Dr. Richrd Nichols's notes) - pt actually states today that it started about a year ago but noted in notes in 2014.  R arm only.  Will note tremor in the R arm both with eating/drinking as well as at rest.  Doesn't notice it if she is "pushing a buggy." Family hx of similar:  No. Voice: no change in voice Sleep: sleeps well but uses klonopin to do so (on klonopin for about a year)  Vivid Dreams:  Yes.   - "I dream terrible things"  Acting out dreams:  Yes.   -  She occasionally will say something Wet Pillows: No. Postural symptoms:  Yes.    Falls?  No., intermittently uses cane (when outside) Bradykinesia symptoms: slow movements and difficulty getting out of a chair Loss of smell:  yes Loss of taste:  Yes.   Urinary Incontinence:  Yes.  , wears a pad Difficulty Swallowing:  No. Handwriting, micrographia: No. Trouble with ADL's:  No., but slower  Trouble buttoning clothing: Yes.   Depression:  No. Memory changes:  Yes.  , mild changes (short term; drives without issue; no trouble remembering to pay bills/take meds) Hallucinations:  No.  visual distortions: Yes.  , rarely N/V:  No. Lightheaded:  No.  Syncope: No. Diplopia:  No. Dyskinesia:  No.   I reviewed her MRI of the brain from 2014.  There was mild to moderate small vessel disease and mild atrophy.  PREVIOUS MEDICATIONS: Sinemet  ALLERGIES:   Allergies  Allergen Reactions  . Naproxen  Itching and Other (See Comments)    Decreased blood pressure  . Dilaudid [Hydromorphone] Other (See Comments)    Patient can not tolerate this medication in high doses, prefers not to take    CURRENT MEDICATIONS:  Outpatient Encounter Prescriptions as of 05/22/2017  Medication Sig  . aspirin EC 81 MG tablet Take 81 mg by mouth daily.  . beta carotene w/minerals (OCUVITE) tablet Take 1 tablet by mouth daily.  . Calcium Carbonate-Vitamin D (CALCIUM 600 + D PO) Take 600 mg by mouth daily.   . clonazePAM (KLONOPIN) 1 MG tablet Take 1 tablet (1 mg total) by mouth at bedtime. (Patient taking differently: Take 0.5-1 mg by mouth 2 (two) times daily. She takes half a tablet in the morning and one tablet at bedtime.)  . cloNIDine (CATAPRES) 0.1 MG tablet Take 1 tablet (0.1 mg total) by mouth at bedtime.  . enalapril (VASOTEC) 20 MG tablet Take 1 tablet (20 mg total) by mouth daily.  . felodipine (PLENDIL) 5 MG 24 hr tablet Take 1 tablet (5 mg total) by mouth daily.  . ferrous sulfate 325 (65 FE) MG tablet Take 1 tablet (325 mg total) by mouth 3 (three) times daily after meals.  . gabapentin (NEURONTIN) 300 MG capsule TAKE ONE CAPSULE BY MOUTH THREE TIMES A DAY (Patient taking differently: TAKE ONE CAPSULE BY MOUTH FOUR TIMES A DAY)  .  Melatonin 3 MG TABS Take 3 mg by mouth at bedtime.  . metFORMIN (GLUCOPHAGE) 500 MG tablet Take 500 mg by mouth 2 (two) times daily with a meal.   . MULTIPLE VITAMINS-MINERALS ER PO Take 1 tablet by mouth daily.   . Omega-3 Fatty Acids (FISH OIL) 1200 MG CAPS Take 1,200 mg by mouth daily.   . simvastatin (ZOCOR) 40 MG tablet Take 1 tablet (40 mg total) by mouth at bedtime.  Marland Kitchen tiZANidine (ZANAFLEX) 4 MG tablet Take 1 tablet (4 mg total) by mouth every 6 (six) hours as needed for muscle spasms.  . traMADol (ULTRAM) 50 MG tablet Take 1-2 tablets (50-100 mg total) by mouth every 6 (six) hours as needed for moderate pain or severe pain.  . rivaroxaban (XARELTO) 10 MG TABS  tablet Take 1 tablet (10 mg total) by mouth daily.  . [DISCONTINUED] chlorhexidine (PERIDEX) 0.12 % solution Use as directed 15 mLs in the mouth or throat 2 (two) times daily.   . [DISCONTINUED] docusate sodium (COLACE) 100 MG capsule Take 1 capsule (100 mg total) by mouth 2 (two) times daily.  . [DISCONTINUED] DULoxetine (CYMBALTA) 60 MG capsule Take 1 capsule (60 mg total) by mouth daily. (Patient not taking: Reported on 04/26/2016)  . [DISCONTINUED] esomeprazole (NEXIUM) 40 MG capsule Take 1 capsule (40 mg total) by mouth daily before breakfast. (Patient taking differently: Take 40 mg by mouth every other day. )  . [DISCONTINUED] hydroxypropyl methylcellulose / hypromellose (ISOPTO TEARS / GONIOVISC) 2.5 % ophthalmic solution Place 1 drop into both eyes 2 (two) times daily.  . [DISCONTINUED] polyethylene glycol (MIRALAX / GLYCOLAX) packet Take 17 g by mouth 2 (two) times daily.   No facility-administered encounter medications on file as of 05/22/2017.     PAST MEDICAL HISTORY:   Past Medical History:  Diagnosis Date  . Anxiety disorder   . Chronic insomnia   . Diabetes mellitus without complication (HCC)   . Dyslipidemia   . History of gastroesophageal reflux (GERD)   . History of kidney stones   . Hypertension   . Incontinence of urine   . Osteoarthritis   . Osteoporosis   . Tooth infection    03-21-16 ISSUE RESOLVED    PAST SURGICAL HISTORY:   Past Surgical History:  Procedure Laterality Date  . CHOLECYSTECTOMY    . CYSTOSCOPY  YEARS AGO  . KNEE SURGERY Right 4-11   ARTHROSCOPY  . RIGHT HIP PINNING  JUNE 2014  . TOTAL KNEE ARTHROPLASTY Right 05/14/2016   Procedure: RIGHT TOTAL KNEE ARTHROPLASTY;  Surgeon: Durene Romans, MD;  Location: [facility];  Service: Orthopedics;  Laterality: Right;    SOCIAL HISTORY:   Social History   Social History  . Marital status: Widowed    Spouse name: N/A  . Number of children: N/A  . Years of education: N/A   Occupational History  . Not  on file.   Social History Main Topics  . Smoking status: Never Smoker  . Smokeless tobacco: Never Used  . Alcohol use No  . Drug use: No  . Sexual activity: Not on file   Other Topics Concern  . Not on file   Social History Narrative  . No narrative on file    FAMILY HISTORY:   Family Status  Relation Status  . Mother Deceased  . Father Deceased  . Sister (Not Specified)  . Brother (Not Specified)  . Child Alive  . Neg Hx (Not Specified)    ROS:  A complete  10 system review of systems was obtained and was unremarkable apart from what is mentioned above.  PHYSICAL EXAMINATION:    VITALS:   Vitals:   05/22/17 1322  BP: 124/62  Pulse: 78  SpO2: 97%  Weight: 127 lb (57.6 kg)  Height: 5\' 4"  (1.626 m)    GEN:  The patient appears stated age and is in NAD. HEENT:  Normocephalic, atraumatic.  The mucous membranes are dry. The superficial temporal arteries are without ropiness or tenderness. CV:  RRR Lungs:  CTAB Neck/HEME:  There are no carotid bruits bilaterally.  Neurological examination:  Orientation: The patient is alert and oriented x3. Fund of knowledge is appropriate.  Recent and remote memory are intact.  Attention and concentration are normal.    Able to name objects and repeat phrases. Cranial nerves: There is good facial symmetry. Pupils are round, surgical and nonreactive.   Fundoscopic exam reveals clear margins bilaterally. Extraocular muscles are intact. The visual fields are full to confrontational testing. The speech is fluent and clear. Soft palate rises symmetrically and there is no tongue deviation. Hearing is intact to conversational tone. Sensation: Sensation is intact to light and pinprick throughout (facial, trunk, extremities). Vibration is intact at the bilateral big toe. There is no extinction with double simultaneous stimulation. There is no sensory dermatomal level identified. Motor: Strength is 5/5 in the bilateral upper and lower extremities.    Shoulder shrug is equal and symmetric.  There is no pronator drift. Deep tendon reflexes: Deep tendon reflexes are 2/4 at the bilateral biceps, triceps, brachioradialis, left patella, absent at the right patella. Plantar responses are downgoing bilaterally.  Movement examination: Tone: There is normal tone in the bilateral upper extremities.  The tone in the lower extremities is normal.  Abnormal movements: There is a near constant LUE resting tremor Coordination:  There is minor decremation with RAM's, with finger taps on the L and with alternation of supination/pronation of the forearm bilaterally.   Gait and Station: The patient has no difficulty arising out of a deep-seated chair without the use of the hands. The patient's stride length is normal.  She is mildly unstable.     LABS:  I received lab work from her primary care physician dated 04/29/2017.  Her white blood cells were 6.2, hemoglobin 12.2, hematocrit 36.5 and platelets 237.  Total iron was 56 with iron saturation slightly low at 17 (20-55%).  Addendum:  TSH last done on 10/22/16 and was 0.50  on the same date, her sodium was 139, potassium 4.7, chloride 100, CO2 29, BUN 25, creatinine 0.84 and glucose 92.  AST 20, ALT 11 and alkaline phosphatase 60.  ASSESSMENT/PLAN:  1.   Tremor  -She has a rest tremor but doesn't meet criteria for Parkinsons disease.  She and I discussed DaTscan but it doesn't really change treatment and she wants to hold on that. She was apparently given carbidopa/levodopa 25/100 by Dr. Marjory Lies in 2014 and not long thereafter broke her hip.  While I think that is likely coincidence, the patient/daughter are leery about the medication.  I wouldn't use it right now anyway given lack of meeting PD criteria.  We talked about meds specifically for tremor but I really don't recommend them in her age group and she agreed.    -talked about balance therapy but she feels she is doing pretty good with her cane and will  hold on that  2.  We will plan on seeing her in 6 months, sooner should  any neurologic issues arise.  Much greater than 50% of this visit was spent in counseling and coordinating care.  Total face to face time:  45 min   Cc:  Kim Housekeeper, MD

## 2017-05-22 ENCOUNTER — Encounter: Payer: Self-pay | Admitting: Neurology

## 2017-05-22 ENCOUNTER — Ambulatory Visit (INDEPENDENT_AMBULATORY_CARE_PROVIDER_SITE_OTHER): Payer: Medicare Other | Admitting: Neurology

## 2017-05-22 VITALS — BP 124/62 | HR 78 | Ht 64.0 in | Wt 127.0 lb

## 2017-05-22 DIAGNOSIS — R251 Tremor, unspecified: Secondary | ICD-10-CM

## 2017-07-13 ENCOUNTER — Emergency Department (HOSPITAL_COMMUNITY): Payer: Medicare Other

## 2017-07-13 ENCOUNTER — Encounter (HOSPITAL_COMMUNITY): Payer: Self-pay | Admitting: Emergency Medicine

## 2017-07-13 ENCOUNTER — Emergency Department (HOSPITAL_COMMUNITY)
Admission: EM | Admit: 2017-07-13 | Discharge: 2017-07-13 | Disposition: A | Discharge status: Home / Self Care | Payer: Medicare Other | Attending: Emergency Medicine | Admitting: Emergency Medicine

## 2017-07-13 DIAGNOSIS — E119 Type 2 diabetes mellitus without complications: Secondary | ICD-10-CM | POA: Diagnosis not present

## 2017-07-13 DIAGNOSIS — W19XXXA Unspecified fall, initial encounter: Secondary | ICD-10-CM

## 2017-07-13 DIAGNOSIS — Z79899 Other long term (current) drug therapy: Secondary | ICD-10-CM | POA: Insufficient documentation

## 2017-07-13 DIAGNOSIS — Z7982 Long term (current) use of aspirin: Secondary | ICD-10-CM | POA: Insufficient documentation

## 2017-07-13 DIAGNOSIS — Z7984 Long term (current) use of oral hypoglycemic drugs: Secondary | ICD-10-CM | POA: Insufficient documentation

## 2017-07-13 DIAGNOSIS — Y9389 Activity, other specified: Secondary | ICD-10-CM | POA: Diagnosis not present

## 2017-07-13 DIAGNOSIS — I10 Essential (primary) hypertension: Secondary | ICD-10-CM | POA: Insufficient documentation

## 2017-07-13 DIAGNOSIS — S0083XA Contusion of other part of head, initial encounter: Secondary | ICD-10-CM

## 2017-07-13 DIAGNOSIS — Y92002 Bathroom of unspecified non-institutional (private) residence single-family (private) house as the place of occurrence of the external cause: Secondary | ICD-10-CM | POA: Insufficient documentation

## 2017-07-13 DIAGNOSIS — Y999 Unspecified external cause status: Secondary | ICD-10-CM | POA: Diagnosis not present

## 2017-07-13 DIAGNOSIS — S0990XA Unspecified injury of head, initial encounter: Secondary | ICD-10-CM | POA: Diagnosis present

## 2017-07-13 DIAGNOSIS — S0181XA Laceration without foreign body of other part of head, initial encounter: Secondary | ICD-10-CM

## 2017-07-13 DIAGNOSIS — W182XXA Fall in (into) shower or empty bathtub, initial encounter: Secondary | ICD-10-CM | POA: Diagnosis not present

## 2017-07-13 MED ORDER — TRAMADOL HCL 50 MG PO TABS
50.0000 mg | ORAL_TABLET | Freq: Once | ORAL | Status: AC
  Administered 2017-07-13: 50 mg via ORAL
  Filled 2017-07-13: qty 1

## 2017-07-13 MED ORDER — CEPHALEXIN 500 MG PO CAPS: 500.0000 mg | ORAL_CAPSULE | Freq: Four times a day (QID) | ORAL | 0 refills | Status: DC

## 2017-07-13 MED ORDER — LIDOCAINE-EPINEPHRINE-TETRACAINE (LET) SOLUTION
3.0000 mL | Freq: Once | NASAL | Status: AC
  Administered 2017-07-13: 3 mL via TOPICAL
  Filled 2017-07-13: qty 3

## 2017-07-13 MED ORDER — BACITRACIN ZINC 500 UNIT/GM EX OINT
TOPICAL_OINTMENT | CUTANEOUS | Status: AC
  Filled 2017-07-13: qty 1.8

## 2017-07-13 NOTE — ED Triage Notes (Signed)
Pt c/o 1 cm long laceration and hematoma over left eye onset last night after slipping and falling into bathtub, striking head. No anticoagulants. Baby aspirin only anticoagulant. Pt remembers getting off balance trying to get dressed, remembers falling, remembers hitting bathtub. Pt does not think she lost consciousness, but daughter states she can't be sure because she was alone. PERLLA.

## 2017-07-13 NOTE — ED Provider Notes (Signed)
[facility]-EMERGENCY DEPT Provider Note   CSN: [id number] Arrival date & time: 07/13/17  1801     History   Chief Complaint Chief Complaint  Patient presents with  . Laceration  . Head Injury  . Fall    HPI Kim Nichols is a 83 y.o. female.  Patient with a history of T2DM, HTN, HLD presents after fall that occurred early this morning. She had gotten up during the night to go to the bathroom and when she stood to pull her pants up she fell forward into the bathtub. She did not lose consciousness. No nausea or vomiting today. She has been ambulatory since the fall and has no hip or extremity pain, and no back pain that is worse than her chronic pain. She complains only of a headache.    The history is provided by the patient. No language interpreter was used.  Laceration    Head Injury   Pertinent negatives include no vomiting.  Fall  Associated symptoms include headaches. Pertinent negatives include no chest pain, no abdominal pain and no shortness of breath.    Past Medical History:  Diagnosis Date  . Anxiety disorder   . Chronic insomnia   . Diabetes mellitus without complication (HCC)   . Dyslipidemia   . History of gastroesophageal reflux (GERD)   . History of kidney stones   . Hypertension   . Incontinence of urine   . Osteoarthritis   . Osteoporosis   . Tooth infection    03-21-16 ISSUE RESOLVED    Patient Active Problem List   Diagnosis Date Noted  . S/P right TKA 05/14/2016  . S/P knee replacement 05/14/2016  . Type II or unspecified type diabetes mellitus without mention of complication, not stated as uncontrolled 07/06/2013  . Femur fracture, right (HCC) 06/28/2013  . Acute encephalopathy 06/22/2013  . Delirium 06/22/2013  . DVT of leg (deep venous thrombosis) (HCC) 06/22/2013  . HTN (hypertension) 06/21/2013  . Hyperlipidemia 06/21/2013  . Anemia 06/21/2013  . Pneumonia 06/21/2013  . Acute respiratory failure (HCC) 06/21/2013    Past Surgical  History:  Procedure Laterality Date  . CHOLECYSTECTOMY    . CYSTOSCOPY  YEARS AGO  . KNEE SURGERY Right 4-11   ARTHROSCOPY  . RIGHT HIP PINNING  JUNE 2014  . TOTAL KNEE ARTHROPLASTY Right 05/14/2016   Procedure: RIGHT TOTAL KNEE ARTHROPLASTY;  Surgeon: Durene RomansMatthew Olin, MD;  Location: [facility];  Service: Orthopedics;  Laterality: Right;    OB History    No data available       Home Medications    Prior to Admission medications   Medication Sig Start Date End Date Taking? Authorizing Provider  aspirin EC 81 MG tablet Take 81 mg by mouth daily.    [provider]  beta carotene w/minerals (OCUVITE) tablet Take 1 tablet by mouth daily.    [provider]  Calcium Carbonate-Vitamin D (CALCIUM 600 + D PO) Take 600 mg by mouth daily.     [provider]  clonazePAM (KLONOPIN) 1 MG tablet Take 1 tablet (1 mg total) by mouth at bedtime. Patient taking differently: Take 0.5-1 mg by mouth 2 (two) times daily. She takes half a tablet in the morning and one tablet at bedtime. 07/09/13   Angiulli, Mcarthur Rossettianiel J, PA-C  cloNIDine (CATAPRES) 0.1 MG tablet Take 1 tablet (0.1 mg total) by mouth at bedtime. 07/09/13   Angiulli, Mcarthur Rossettianiel J, PA-C  enalapril (VASOTEC) 20 MG tablet Take 1 tablet (20 mg total) by mouth  daily. 07/09/13   Angiulli, Mcarthur Rossetti, PA-C  felodipine (PLENDIL) 5 MG 24 hr tablet Take 1 tablet (5 mg total) by mouth daily. 07/09/13   Angiulli, Mcarthur Rossetti, PA-C  ferrous sulfate 325 (65 FE) MG tablet Take 1 tablet (325 mg total) by mouth 3 (three) times daily after meals. 05/15/16   Lanney Gins, PA-C  gabapentin (NEURONTIN) 300 MG capsule TAKE ONE CAPSULE BY MOUTH THREE TIMES A DAY Patient taking differently: TAKE ONE CAPSULE BY MOUTH FOUR TIMES A DAY 09/20/13   Ranelle Oyster, MD  Melatonin 3 MG TABS Take 3 mg by mouth at bedtime.    [provider]  metFORMIN (GLUCOPHAGE) 500 MG tablet Take 500 mg by mouth 2 (two) times daily with a meal.  01/09/16   [provider]  MULTIPLE VITAMINS-MINERALS ER PO Take 1 tablet by mouth daily.     [provider]  Omega-3 Fatty Acids (FISH OIL) 1200 MG CAPS Take 1,200 mg by mouth daily.     [provider]  rivaroxaban (XARELTO) 10 MG TABS tablet Take 1 tablet (10 mg total) by mouth daily. 05/15/16 05/29/16  Lanney Gins, PA-C  simvastatin (ZOCOR) 40 MG tablet Take 1 tablet (40 mg total) by mouth at bedtime. 07/09/13   Angiulli, Mcarthur Rossetti, PA-C  tiZANidine (ZANAFLEX) 4 MG tablet Take 1 tablet (4 mg total) by mouth every 6 (six) hours as needed for muscle spasms. 05/15/16   Lanney Gins, PA-C  traMADol (ULTRAM) 50 MG tablet Take 1-2 tablets (50-100 mg total) by mouth every 6 (six) hours as needed for moderate pain or severe pain. 05/15/16   Lanney Gins, PA-C    Family History Family History  Problem Relation Age of Onset  . Heart attack Mother   . Hypertension Mother   . Heart attack Sister   . Hypertension Brother        x2  . Stroke Neg Hx     Social History Social History  Substance Use Topics  . Smoking status: Never Smoker  . Smokeless tobacco: Never Used  . Alcohol use No     Allergies   Naproxen and Dilaudid [hydromorphone]   Review of Systems Review of Systems  Constitutional: Negative for chills and fever.  Respiratory: Negative.  Negative for shortness of breath.   Cardiovascular: Negative.  Negative for chest pain.  Gastrointestinal: Negative.  Negative for abdominal pain, nausea and vomiting.  Musculoskeletal: Positive for back pain (Chronic, unchanged since fall). Negative for neck pain.  Skin: Positive for wound (Facial laceration).  Neurological: Positive for headaches.       Fall with facial bruise and headache  Psychiatric/Behavioral: Negative for confusion.     Physical Exam Updated Vital Signs BP (!) 150/65   Pulse 65   Temp 98.5 F (36.9 C) (Oral)   Resp 20   SpO2 99%   Physical Exam  Constitutional: She is oriented to person,  place, and time. She appears well-developed and well-nourished. No distress.  HENT:  Head: Normocephalic.  Swelling and bruising above left eye with 2 cm laceration above eye brow. No facial bone tenderness.   Eyes: Pupils are equal, round, and reactive to light. Conjunctivae and EOM are normal.  No pain with full ROM left eye.  Neck: Normal range of motion. Neck supple.  Cardiovascular: Normal rate.   Pulmonary/Chest: Effort normal.  Abdominal: Soft. Bowel sounds are normal. There is no tenderness. There is no rebound and no guarding.  Musculoskeletal: Normal range of motion.  Neurological: She is alert and oriented to person, place, and time.  CN's 3-12 grossly intact. Speech is clear and focused. No facial asymmetry. No lateralizing weakness. No deficits of coordination. Ambulatory without imbalance.    Skin: Skin is warm and dry. No rash noted.  Facial laceration.  Psychiatric: She has a normal mood and affect.     ED Treatments / Results  Labs (all labs ordered are listed, but only abnormal results are displayed) Labs Reviewed - No data to display  EKG  EKG Interpretation None       Radiology No results found.  Procedures Procedures (including critical care time) LACERATION REPAIR Performed by: Elpidio Anis A Authorized by: Elpidio Anis A Consent: Verbal consent obtained. Risks and benefits: risks, benefits and alternatives were discussed Consent given by: patient Patient identity confirmed: provided demographic data Prepped and Draped in normal sterile fashion Wound explored  Laceration Location: left eye brow  Laceration Length: 2 cm  No Foreign Bodies seen or palpated  Anesthesia: local infiltration  Local anesthetic: L.E.T.  Anesthetic total: 3 ml  Irrigation method: syringe Amount of cleaning: standard  Skin closure: Dermabond   Number of sutures: 0  Technique: Dermabond  Patient tolerance: Patient tolerated the procedure well with no  immediate complications.  Medications Ordered in ED Medications - No data to display   Initial Impression / Assessment and Plan / ED Course  I have reviewed the triage vital signs and the nursing notes.  Pertinent labs & imaging results that were available during my care of the patient were reviewed by me and considered in my medical decision making (see chart for details).     Patient with fall several hours prior to arrival. Up to the bathroom, with fall after standing up. She denies dizziness, syncope, vomiting. Felt to be mechanical fall after losing balance trying to pull up clothing.   Headache improved after Ultram. Laceration above left eye brow repaired as per above note. Will cover with abx given delay in presentation. Tetanus up to date.   Final Clinical Impressions(s) / ED Diagnoses   Final diagnoses:  None   1. Fall 2. Facial laceration 3. Delayed presentation  New Prescriptions New Prescriptions   No medications on file     Danne Harbor 07/13/17 2228    Lorre Nick, MD 07/13/17 2248

## 2017-11-17 NOTE — Progress Notes (Deleted)
Kim Nichols was seen today in the movement disorders clinic for neurologic consultation at the request of [name], [name], MD.  This patient is accompanied in the office by her spouse who supplements the history. The consultation is for the evaluation of tremor.  She has previously seen Dr. [name] in 2014 and I reviewed his records.  He suspected pt had PD and it looks like she was given carbidopa/levodopa 25/100 but she never followed back up with him.  Specific Symptoms:  Tremor: Yes.   (at least since 2014 in the R hand as noted in Dr. [name]'s notes) - pt actually states today that it started about a year ago but noted in notes in 2014.  R arm only.  Will note tremor in the R arm both with eating/drinking as well as at rest.  Doesn't notice it if she is "pushing a buggy." Family hx of similar:  No. Voice: no change in voice Sleep: sleeps well but uses klonopin to do so (on klonopin for about a year)  Vivid Dreams:  Yes.   - "I dream terrible things"  Acting out dreams:  Yes.   -  She occasionally will say something Wet Pillows: No. Postural symptoms:  Yes.    Falls?  No., intermittently uses cane (when outside) Bradykinesia symptoms: slow movements and difficulty getting out of a chair Loss of smell:  yes Loss of taste:  Yes.   Urinary Incontinence:  Yes.  , wears a pad Difficulty Swallowing:  No. Handwriting, micrographia: No. Trouble with ADL's:  No., but slower  Trouble buttoning clothing: Yes.   Depression:  No. Memory changes:  Yes.  , mild changes (short term; drives without issue; no trouble remembering to pay bills/take meds) Hallucinations:  No.  visual distortions: Yes.  , rarely N/V:  No. Lightheaded:  No.  Syncope: No. Diplopia:  No. Dyskinesia:  No.   I reviewed her MRI of the brain from 2014.  There was mild to moderate small vessel disease and mild atrophy.  11/24/17 update: Patient seen today in follow-up, accompanied by her daughter who supplements  history.  Patient has a history of tremor.  She reports that tremor has not changed significantly since our last visit. Pt was in the emergency room on July 13, 2017 after a fall resulting in a contusion/laceration to the left eyebrow.  The patient had gotten up in the middle of the night and gone to the bathroom and stood up from the toilet and just got off balance and fell forward and hit her face on the bathtub.  Pt denies lightheadedness, near syncope.  No hallucinations.  Mood has been good.  PREVIOUS MEDICATIONS: Sinemet  ALLERGIES:   Allergies  Allergen Reactions  . Naproxen Itching and Other (See Comments)    Decreased blood pressure  . Dilaudid [Hydromorphone] Other (See Comments)    Patient can not tolerate this medication in high doses, prefers not to take    CURRENT MEDICATIONS:  Outpatient Encounter Medications as of 11/24/2017  Medication Sig  . aspirin EC 81 MG tablet Take 81 mg by mouth daily.  . beta carotene w/minerals (OCUVITE) tablet Take 1 tablet by mouth daily.  . Calcium Carbonate-Vitamin D (CALCIUM 600 + D PO) Take 600 mg by mouth daily.   . cephALEXin (KEFLEX) 500 MG capsule Take 1 capsule (500 mg total) by mouth 4 (four) times daily.  . clonazePAM (KLONOPIN) 1 MG tablet Take 1 tablet (1 mg total) by mouth at bedtime. (Patient  taking differently: Take 0.5-1 mg by mouth 2 (two) times daily. She takes half a tablet in the morning and one tablet at bedtime.)  . cloNIDine (CATAPRES) 0.1 MG tablet Take 1 tablet (0.1 mg total) by mouth at bedtime.  . enalapril (VASOTEC) 20 MG tablet Take 1 tablet (20 mg total) by mouth daily.  . felodipine (PLENDIL) 5 MG 24 hr tablet Take 1 tablet (5 mg total) by mouth daily.  . ferrous sulfate 325 (65 FE) MG tablet Take 1 tablet (325 mg total) by mouth 3 (three) times daily after meals.  . gabapentin (NEURONTIN) 300 MG capsule TAKE ONE CAPSULE BY MOUTH THREE TIMES A DAY (Patient taking differently: TAKE ONE CAPSULE BY MOUTH FOUR TIMES A  DAY)  . Melatonin 3 MG TABS Take 3 mg by mouth at bedtime.  . metFORMIN (GLUCOPHAGE) 500 MG tablet Take 500 mg by mouth 2 (two) times daily with a meal.   . MULTIPLE VITAMINS-MINERALS ER PO Take 1 tablet by mouth daily.   . Omega-3 Fatty Acids (FISH OIL) 1200 MG CAPS Take 1,200 mg by mouth daily.   . rivaroxaban (XARELTO) 10 MG TABS tablet Take 1 tablet (10 mg total) by mouth daily.  . simvastatin (ZOCOR) 40 MG tablet Take 1 tablet (40 mg total) by mouth at bedtime.  Marland Kitchen tiZANidine (ZANAFLEX) 4 MG tablet Take 1 tablet (4 mg total) by mouth every 6 (six) hours as needed for muscle spasms.  . traMADol (ULTRAM) 50 MG tablet Take 1-2 tablets (50-100 mg total) by mouth every 6 (six) hours as needed for moderate pain or severe pain.   No facility-administered encounter medications on file as of 11/24/2017.     PAST MEDICAL HISTORY:   Past Medical History:  Diagnosis Date  . Anxiety disorder   . Chronic insomnia   . Diabetes mellitus without complication (HCC)   . Dyslipidemia   . History of gastroesophageal reflux (GERD)   . History of kidney stones   . Hypertension   . Incontinence of urine   . Osteoarthritis   . Osteoporosis   . Tooth infection    03-21-16 ISSUE RESOLVED    PAST SURGICAL HISTORY:   Past Surgical History:  Procedure Laterality Date  . CHOLECYSTECTOMY    . CYSTOSCOPY  YEARS AGO  . KNEE SURGERY Right 4-11   ARTHROSCOPY  . RIGHT HIP PINNING  JUNE 2014  . RIGHT TOTAL KNEE ARTHROPLASTY Right 05/14/2016   Performed by Durene Romans, MD at [facility]    SOCIAL HISTORY:   Social History   Socioeconomic History  . Marital status: Widowed    Spouse name: Not on file  . Number of children: Not on file  . Years of education: Not on file  . Highest education level: Not on file  Social Needs  . Financial resource strain: Not on file  . Food insecurity - worry: Not on file  . Food insecurity - inability: Not on file  . Transportation needs - medical: Not on file  .  Transportation needs - non-medical: Not on file  Occupational History  . Not on file  Tobacco Use  . Smoking status: Never Smoker  . Smokeless tobacco: Never Used  Substance and Sexual Activity  . Alcohol use: No  . Drug use: No  . Sexual activity: Not on file  Other Topics Concern  . Not on file  Social History Narrative  . Not on file    FAMILY HISTORY:   Family Status  Relation Name  Status  . Mother  Deceased  . Father  Deceased  . Sister  (Not Specified)  . Brother  (Not Specified)  . Child x2 Alive  . Neg Hx  (Not Specified)    ROS:  A complete 10 system review of systems was obtained and was unremarkable apart from what is mentioned above.  PHYSICAL EXAMINATION:    VITALS:   There were no vitals filed for this visit.  GEN:  The patient appears stated age and is in NAD. HEENT:  Normocephalic, atraumatic.  The mucous membranes are dry. The superficial temporal arteries are without ropiness or tenderness. CV:  RRR Lungs:  CTAB Neck/HEME:  There are no carotid bruits bilaterally.  Neurological examination:  Orientation: The patient is alert and oriented x3. Fund of knowledge is appropriate.  Recent and remote memory are intact.  Attention and concentration are normal.    Able to name objects and repeat phrases. Cranial nerves: There is good facial symmetry. Pupils are round, surgical and nonreactive.   Fundoscopic exam reveals clear margins bilaterally. Extraocular muscles are intact. The visual fields are full to confrontational testing. The speech is fluent and clear. Soft palate rises symmetrically and there is no tongue deviation. Hearing is intact to conversational tone. Sensation: Sensation is intact to light and pinprick throughout (facial, trunk, extremities). Vibration is intact at the bilateral big toe. There is no extinction with double simultaneous stimulation. There is no sensory dermatomal level identified. Motor: Strength is 5/5 in the bilateral upper and  lower extremities.   Shoulder shrug is equal and symmetric.  There is no pronator drift. Deep tendon reflexes: Deep tendon reflexes are 2/4 at the bilateral biceps, triceps, brachioradialis, left patella, absent at the right patella. Plantar responses are downgoing bilaterally.  Movement examination: Tone: There is normal tone in the bilateral upper extremities.  The tone in the lower extremities is normal.  Abnormal movements: There is a near constant LUE resting tremor Coordination:  There is minor decremation with RAM's, with finger taps on the L and with alternation of supination/pronation of the forearm bilaterally.   Gait and Station: The patient has no difficulty arising out of a deep-seated chair without the use of the hands. The patient's stride length is normal.  She is mildly unstable.     LABS:  I received lab work from her primary care physician dated 04/29/2017.  Her white blood cells were 6.2, hemoglobin 12.2, hematocrit 36.5 and platelets 237.  Total iron was 56 with iron saturation slightly low at 17 (20-55%).  Addendum:  TSH last done on 10/22/16 and was 0.50  on the same date, her sodium was 139, potassium 4.7, chloride 100, CO2 29, BUN 25, creatinine 0.84 and glucose 92.  AST 20, ALT 11 and alkaline phosphatase 60.  ASSESSMENT/PLAN:  1.   Tremor  -She has a rest tremor but doesn't meet criteria for Parkinsons disease.  She and I discussed DaTscan but it doesn't really change treatment and she wants to hold on that. She was apparently given carbidopa/levodopa 25/100 by Dr. Marjory Lies in 2014 and not long thereafter broke her hip.  While I think that is likely coincidence, the patient/daughter are leery about the medication.  I wouldn't use it right now anyway given lack of meeting PD criteria.  We talked about meds specifically for tremor but I really don't recommend them in her age group and she agreed.    -talked about balance therapy but she feels she is doing pretty good with  her cane and will hold on that  2.  We will plan on seeing her in 6 months, sooner should any neurologic issues arise.  Much greater than 50% of this visit was spent in counseling and coordinating care.  Total face to face time:  45 min   Cc:  [name], [name], MD

## 2017-11-24 ENCOUNTER — Ambulatory Visit: Payer: Medicare Other | Admitting: Neurology

## 2018-09-09 ENCOUNTER — Emergency Department (HOSPITAL_COMMUNITY)
Admission: EM | Admit: 2018-09-09 | Discharge: 2018-09-09 | Disposition: A | Discharge status: Home / Self Care | Payer: Medicare Other | Attending: Emergency Medicine | Admitting: Emergency Medicine

## 2018-09-09 ENCOUNTER — Other Ambulatory Visit: Payer: Self-pay

## 2018-09-09 ENCOUNTER — Encounter (HOSPITAL_COMMUNITY): Payer: Self-pay

## 2018-09-09 DIAGNOSIS — Z7982 Long term (current) use of aspirin: Secondary | ICD-10-CM | POA: Diagnosis not present

## 2018-09-09 DIAGNOSIS — Z7984 Long term (current) use of oral hypoglycemic drugs: Secondary | ICD-10-CM | POA: Diagnosis not present

## 2018-09-09 DIAGNOSIS — W540XXA Bitten by dog, initial encounter: Secondary | ICD-10-CM | POA: Insufficient documentation

## 2018-09-09 DIAGNOSIS — E119 Type 2 diabetes mellitus without complications: Secondary | ICD-10-CM | POA: Insufficient documentation

## 2018-09-09 DIAGNOSIS — Y999 Unspecified external cause status: Secondary | ICD-10-CM | POA: Diagnosis not present

## 2018-09-09 DIAGNOSIS — Y929 Unspecified place or not applicable: Secondary | ICD-10-CM | POA: Diagnosis not present

## 2018-09-09 DIAGNOSIS — Z86718 Personal history of other venous thrombosis and embolism: Secondary | ICD-10-CM | POA: Insufficient documentation

## 2018-09-09 DIAGNOSIS — S01511A Laceration without foreign body of lip, initial encounter: Secondary | ICD-10-CM | POA: Diagnosis present

## 2018-09-09 DIAGNOSIS — Z79899 Other long term (current) drug therapy: Secondary | ICD-10-CM | POA: Insufficient documentation

## 2018-09-09 DIAGNOSIS — Y939 Activity, unspecified: Secondary | ICD-10-CM | POA: Diagnosis not present

## 2018-09-09 DIAGNOSIS — Z23 Encounter for immunization: Secondary | ICD-10-CM | POA: Diagnosis not present

## 2018-09-09 DIAGNOSIS — I10 Essential (primary) hypertension: Secondary | ICD-10-CM | POA: Diagnosis not present

## 2018-09-09 DIAGNOSIS — S0181XA Laceration without foreign body of other part of head, initial encounter: Secondary | ICD-10-CM

## 2018-09-09 DIAGNOSIS — S0185XA Open bite of other part of head, initial encounter: Secondary | ICD-10-CM

## 2018-09-09 MED ORDER — TETANUS-DIPHTH-ACELL PERTUSSIS 5-2.5-18.5 LF-MCG/0.5 IM SUSP
0.5000 mL | Freq: Once | INTRAMUSCULAR | Status: AC
  Administered 2018-09-09: 0.5 mL via INTRAMUSCULAR
  Filled 2018-09-09: qty 0.5

## 2018-09-09 MED ORDER — LIDOCAINE HCL (PF) 1 % IJ SOLN
5.0000 mL | Freq: Once | INTRAMUSCULAR | Status: AC
  Administered 2018-09-09: 5 mL
  Filled 2018-09-09: qty 30

## 2018-09-09 MED ORDER — AMOXICILLIN-POT CLAVULANATE 875-125 MG PO TABS: 1.0000 | ORAL_TABLET | Freq: Two times a day (BID) | ORAL | 0 refills | Status: AC

## 2018-09-09 NOTE — ED Notes (Signed)
Bed: [facility] Expected date:  Expected time:  Means of arrival:  Comments: 

## 2018-09-09 NOTE — Discharge Instructions (Signed)
Take antibiotics as prescribed. Clean wound clean and dry. Ok to apply topical antibiotic ointment. YOu will need to have sutures removed in 5 days, this can be done by your PCP or an urgent care. Return to the ED if you have significant swelling, drainage of affected area, fever.

## 2018-09-09 NOTE — ED Provider Notes (Signed)
[facility]-EMERGENCY DEPT Provider Note   CSN: [id number] Arrival date & time: 09/09/18  4098     History   Chief Complaint Chief Complaint  Patient presents with  . Animal Bite    HPI Kim Nichols is a 84 y.o. female with a past medical history diabetes, hypertension, GERD who presented to the ED today complaining of a dog bite to her upper lip. Patient states that yesterday around 5 PM her [place] bit her upper lip. Initially this led heavily but it has since subsided.  She is unsure when her last tetanus was. She reports that her dog is up-to-date on his vaccines.  HPI  Past Medical History:  Diagnosis Date  . Anxiety disorder   . Chronic insomnia   . Diabetes mellitus without complication (HCC)   . Dyslipidemia   . History of gastroesophageal reflux (GERD)   . History of kidney stones   . Hypertension   . Incontinence of urine   . Osteoarthritis   . Osteoporosis   . Tooth infection    03-21-16 ISSUE RESOLVED    Patient Active Problem List   Diagnosis Date Noted  . S/P right TKA 05/14/2016  . S/P knee replacement 05/14/2016  . Type II or unspecified type diabetes mellitus without mention of complication, not stated as uncontrolled 07/06/2013  . Femur fracture, right (HCC) 06/28/2013  . Acute encephalopathy 06/22/2013  . Delirium 06/22/2013  . DVT of leg (deep venous thrombosis) (HCC) 06/22/2013  . HTN (hypertension) 06/21/2013  . Hyperlipidemia 06/21/2013  . Anemia 06/21/2013  . Pneumonia 06/21/2013  . Acute respiratory failure (HCC) 06/21/2013    Past Surgical History:  Procedure Laterality Date  . CHOLECYSTECTOMY    . CYSTOSCOPY  YEARS AGO  . KNEE SURGERY Right 4-11   ARTHROSCOPY  . RIGHT HIP PINNING  JUNE 2014  . TOTAL KNEE ARTHROPLASTY Right 05/14/2016   Procedure: RIGHT TOTAL KNEE ARTHROPLASTY;  Surgeon: Durene Romans, MD;  Location: [facility];  Service: Orthopedics;  Laterality: Right;     OB History   None      Home  Medications    Prior to Admission medications   Medication Sig Start Date End Date Taking? Authorizing Provider  amoxicillin-clavulanate (AUGMENTIN) 875-125 MG tablet Take 1 tablet by mouth 2 (two) times daily for 7 days. 09/09/18 09/16/18  Dowless, Lester Kinsman, PA-C  aspirin EC 81 MG tablet Take 81 mg by mouth daily.    [provider]  beta carotene w/minerals (OCUVITE) tablet Take 1 tablet by mouth daily.    [provider]  Calcium Carbonate-Vitamin D (CALCIUM 600 + D PO) Take 600 mg by mouth daily.     [provider]  cephALEXin (KEFLEX) 500 MG capsule Take 1 capsule (500 mg total) by mouth 4 (four) times daily. 07/13/17   Elpidio Anis, PA-C  clonazePAM (KLONOPIN) 1 MG tablet Take 1 tablet (1 mg total) by mouth at bedtime. Patient taking differently: Take 0.5-1 mg by mouth 2 (two) times daily. She takes half a tablet in the morning and one tablet at bedtime. 07/09/13   Angiulli, Mcarthur Rossetti, PA-C  cloNIDine (CATAPRES) 0.1 MG tablet Take 1 tablet (0.1 mg total) by mouth at bedtime. 07/09/13   Angiulli, Mcarthur Rossetti, PA-C  enalapril (VASOTEC) 20 MG tablet Take 1 tablet (20 mg total) by mouth daily. 07/09/13   Angiulli, Mcarthur Rossetti, PA-C  felodipine (PLENDIL) 5 MG 24 hr tablet Take 1 tablet (5 mg total) by mouth daily. 07/09/13  Angiulli, Mcarthur Rossetti, PA-C  ferrous sulfate 325 (65 FE) MG tablet Take 1 tablet (325 mg total) by mouth 3 (three) times daily after meals. 05/15/16   Lanney Gins, PA-C  gabapentin (NEURONTIN) 300 MG capsule TAKE ONE CAPSULE BY MOUTH THREE TIMES A DAY Patient taking differently: TAKE ONE CAPSULE BY MOUTH FOUR TIMES A DAY 09/20/13   Ranelle Oyster, MD  Melatonin 3 MG TABS Take 3 mg by mouth at bedtime.    [provider]  metFORMIN (GLUCOPHAGE) 500 MG tablet Take 500 mg by mouth 2 (two) times daily with a meal.  01/09/16   [provider]  MULTIPLE VITAMINS-MINERALS ER PO Take 1 tablet by mouth daily.     [provider]    Omega-3 Fatty Acids (FISH OIL) 1200 MG CAPS Take 1,200 mg by mouth daily.     [provider]  rivaroxaban (XARELTO) 10 MG TABS tablet Take 1 tablet (10 mg total) by mouth daily. 05/15/16 05/29/16  Lanney Gins, PA-C  simvastatin (ZOCOR) 40 MG tablet Take 1 tablet (40 mg total) by mouth at bedtime. 07/09/13   Angiulli, Mcarthur Rossetti, PA-C  tiZANidine (ZANAFLEX) 4 MG tablet Take 1 tablet (4 mg total) by mouth every 6 (six) hours as needed for muscle spasms. 05/15/16   Lanney Gins, PA-C  traMADol (ULTRAM) 50 MG tablet Take 1-2 tablets (50-100 mg total) by mouth every 6 (six) hours as needed for moderate pain or severe pain. 05/15/16   Lanney Gins, PA-C    Family History Family History  Problem Relation Age of Onset  . Heart attack Mother   . Hypertension Mother   . Heart attack Sister   . Hypertension Brother        x2  . Stroke Neg Hx     Social History Social History   Tobacco Use  . Smoking status: Never Smoker  . Smokeless tobacco: Never Used  Substance Use Topics  . Alcohol use: No  . Drug use: No     Allergies   Naproxen and Dilaudid [hydromorphone]   Review of Systems Review of Systems  All other systems reviewed and are negative.    Physical Exam Updated Vital Signs BP (!) 167/85 (BP Location: Right Arm)   Pulse 77   Temp 97.6 F (36.4 C) (Oral)   Resp 19   Ht 5\' 4"  (1.626 m)   Wt 59 kg   SpO2 99%   BMI 22.31 kg/m   Physical Exam  Constitutional: She is oriented to person, place, and time. She appears well-developed and well-nourished. No distress.  HENT:  Head: Normocephalic and atraumatic.  Eyes: Conjunctivae are normal. Right eye exhibits no discharge. Left eye exhibits no discharge. No scleral icterus.  Cardiovascular: Normal rate.  Pulmonary/Chest: Effort normal.  Neurological: She is alert and oriented to person, place, and time. Coordination normal.  Skin: Skin is warm and dry. No rash noted. She is not diaphoretic. No erythema. No  pallor.  1 cm linear laceration to left upper lip that does cross the vermilion border. Wound is approximated however can be pulled apart. No through and through wound. She does have a small punctate puncture wound above her right upper lip. No injury to gum or maxilla.  Psychiatric: She has a normal mood and affect. Her behavior is normal.  Nursing note and vitals reviewed.    ED Treatments / Results  Labs (all labs ordered are listed, but only abnormal results are displayed) Labs Reviewed - No data to  display  EKG None  Radiology No results found.  Procedures Procedures (including critical care time)  LACERATION REPAIR Performed by: Dub Mikes Authorized by: Dub Mikes Consent: Verbal consent obtained. Risks and benefits: risks, benefits and alternatives were discussed Consent given by: patient Patient identity confirmed: provided demographic data Prepped and Draped in normal sterile fashion Wound explored  Laceration Location: upper lip  Laceration Length: 1cm  No Foreign Bodies seen or palpated  Anesthesia: local infiltration  Local anesthetic: lidocaine 1%   Anesthetic total: 3 ml  Irrigation method: syringe Amount of cleaning: standard  Skin closure: wound approximated  Number of sutures: 1  Technique: simple interrupted  Patient tolerance: Patient tolerated the procedure well with no immediate complications.  Medications Ordered in ED Medications  Tdap (BOOSTRIX) injection 0.5 mL (0.5 mLs Intramuscular Given 09/09/18 1130)  lidocaine (PF) (XYLOCAINE) 1 % injection 5 mL (5 mLs Infiltration Given 09/09/18 1055)     Initial Impression / Assessment and Plan / ED Course  I have reviewed the triage vital signs and the nursing notes.  Pertinent labs & imaging results that were available during my care of the patient were reviewed by me and considered in my medical decision making (see chart for details).     Tdap booster  given.Pressure irrigation performed.  Pt has no co morbidities to effect normal wound healing. Wound was approximated with only 1 suture allowing for rest of wound to drain as this was a dog bite. Discussed suture home care w pt and answered questions. Pt to f-u for wound check and suture removal in 5 days. She was given course of augmentin. Pt is hemodynamically stable w no complaints prior to dc.    Patient was discussed with and seen by Dr. Criss Alvine who agrees with the treatment plan.   Final Clinical Impressions(s) / ED Diagnoses   Final diagnoses:  Dog bite of face, initial encounter  Facial laceration, initial encounter    ED Discharge Orders         Ordered    amoxicillin-clavulanate (AUGMENTIN) 875-125 MG tablet  2 times daily     09/09/18 1141           Dowless, [place], PA-C 09/09/18 1525    Pricilla Loveless, MD 09/10/18 1009

## 2018-09-09 NOTE — ED Triage Notes (Addendum)
Pt states her own dog bit her yesterday afternoon around 1700 on her upper lip. Per pt, dog has received all vaccines/shots.

## 2019-02-10 ENCOUNTER — Emergency Department (HOSPITAL_COMMUNITY): Payer: Medicare Other

## 2019-02-10 ENCOUNTER — Other Ambulatory Visit: Payer: Self-pay

## 2019-02-10 ENCOUNTER — Observation Stay (HOSPITAL_COMMUNITY)
Admission: EM | Admit: 2019-02-10 | Discharge: 2019-02-13 | Disposition: A | Discharge status: Home / Self Care | Payer: Medicare Other | Attending: Internal Medicine | Admitting: Internal Medicine

## 2019-02-10 ENCOUNTER — Encounter (HOSPITAL_COMMUNITY): Payer: Self-pay | Admitting: *Deleted

## 2019-02-10 DIAGNOSIS — R509 Fever, unspecified: Secondary | ICD-10-CM | POA: Diagnosis not present

## 2019-02-10 DIAGNOSIS — R410 Disorientation, unspecified: Secondary | ICD-10-CM

## 2019-02-10 DIAGNOSIS — M25511 Pain in right shoulder: Secondary | ICD-10-CM

## 2019-02-10 DIAGNOSIS — Z7982 Long term (current) use of aspirin: Secondary | ICD-10-CM | POA: Insufficient documentation

## 2019-02-10 DIAGNOSIS — F419 Anxiety disorder, unspecified: Secondary | ICD-10-CM | POA: Insufficient documentation

## 2019-02-10 DIAGNOSIS — Y92009 Unspecified place in unspecified non-institutional (private) residence as the place of occurrence of the external cause: Secondary | ICD-10-CM | POA: Diagnosis not present

## 2019-02-10 DIAGNOSIS — Z8249 Family history of ischemic heart disease and other diseases of the circulatory system: Secondary | ICD-10-CM | POA: Insufficient documentation

## 2019-02-10 DIAGNOSIS — Z66 Do not resuscitate: Secondary | ICD-10-CM | POA: Diagnosis not present

## 2019-02-10 DIAGNOSIS — Z7984 Long term (current) use of oral hypoglycemic drugs: Secondary | ICD-10-CM | POA: Diagnosis not present

## 2019-02-10 DIAGNOSIS — I10 Essential (primary) hypertension: Secondary | ICD-10-CM | POA: Insufficient documentation

## 2019-02-10 DIAGNOSIS — G8929 Other chronic pain: Secondary | ICD-10-CM | POA: Diagnosis not present

## 2019-02-10 DIAGNOSIS — M25552 Pain in left hip: Secondary | ICD-10-CM | POA: Insufficient documentation

## 2019-02-10 DIAGNOSIS — R51 Headache: Secondary | ICD-10-CM | POA: Diagnosis not present

## 2019-02-10 DIAGNOSIS — W010XXA Fall on same level from slipping, tripping and stumbling without subsequent striking against object, initial encounter: Secondary | ICD-10-CM | POA: Diagnosis not present

## 2019-02-10 DIAGNOSIS — R9082 White matter disease, unspecified: Secondary | ICD-10-CM | POA: Insufficient documentation

## 2019-02-10 DIAGNOSIS — E119 Type 2 diabetes mellitus without complications: Secondary | ICD-10-CM | POA: Insufficient documentation

## 2019-02-10 DIAGNOSIS — Z79899 Other long term (current) drug therapy: Secondary | ICD-10-CM | POA: Insufficient documentation

## 2019-02-10 DIAGNOSIS — R2681 Unsteadiness on feet: Secondary | ICD-10-CM | POA: Diagnosis not present

## 2019-02-10 DIAGNOSIS — Z885 Allergy status to narcotic agent status: Secondary | ICD-10-CM | POA: Insufficient documentation

## 2019-02-10 DIAGNOSIS — M25569 Pain in unspecified knee: Secondary | ICD-10-CM | POA: Insufficient documentation

## 2019-02-10 DIAGNOSIS — M19011 Primary osteoarthritis, right shoulder: Secondary | ICD-10-CM | POA: Diagnosis not present

## 2019-02-10 DIAGNOSIS — Z886 Allergy status to analgesic agent status: Secondary | ICD-10-CM | POA: Insufficient documentation

## 2019-02-10 DIAGNOSIS — Y9301 Activity, walking, marching and hiking: Secondary | ICD-10-CM | POA: Insufficient documentation

## 2019-02-10 DIAGNOSIS — M6281 Muscle weakness (generalized): Secondary | ICD-10-CM | POA: Diagnosis not present

## 2019-02-10 DIAGNOSIS — S0990XA Unspecified injury of head, initial encounter: Secondary | ICD-10-CM | POA: Insufficient documentation

## 2019-02-10 DIAGNOSIS — Z86718 Personal history of other venous thrombosis and embolism: Secondary | ICD-10-CM | POA: Diagnosis not present

## 2019-02-10 DIAGNOSIS — R739 Hyperglycemia, unspecified: Secondary | ICD-10-CM

## 2019-02-10 DIAGNOSIS — Z9181 History of falling: Secondary | ICD-10-CM | POA: Insufficient documentation

## 2019-02-10 DIAGNOSIS — W19XXXA Unspecified fall, initial encounter: Secondary | ICD-10-CM

## 2019-02-10 LAB — URINALYSIS, ROUTINE W REFLEX MICROSCOPIC
BILIRUBIN URINE: NEGATIVE
Glucose, UA: NEGATIVE mg/dL
Hgb urine dipstick: NEGATIVE
Ketones, ur: 5 mg/dL — AB
Leukocytes,Ua: NEGATIVE
NITRITE: NEGATIVE
PROTEIN: NEGATIVE mg/dL
Specific Gravity, Urine: 1.016 (ref 1.005–1.030)
pH: 7 (ref 5.0–8.0)

## 2019-02-10 LAB — CBC WITH DIFFERENTIAL/PLATELET
Abs Immature Granulocytes: 0.03 10*3/uL (ref 0.00–0.07)
BASOS PCT: 0 %
Basophils Absolute: 0 10*3/uL (ref 0.0–0.1)
Eosinophils Absolute: 0 10*3/uL (ref 0.0–0.5)
Eosinophils Relative: 0 %
HCT: 37.5 % (ref 36.0–46.0)
Hemoglobin: 11.7 g/dL — ABNORMAL LOW (ref 12.0–15.0)
Immature Granulocytes: 0 %
Lymphocytes Relative: 6 %
Lymphs Abs: 0.5 10*3/uL — ABNORMAL LOW (ref 0.7–4.0)
MCH: 28.5 pg (ref 26.0–34.0)
MCHC: 31.2 g/dL (ref 30.0–36.0)
MCV: 91.5 fL (ref 80.0–100.0)
MONO ABS: 0.2 10*3/uL (ref 0.1–1.0)
MONOS PCT: 3 %
Neutro Abs: 8.2 10*3/uL — ABNORMAL HIGH (ref 1.7–7.7)
Neutrophils Relative %: 91 %
PLATELETS: 209 10*3/uL (ref 150–400)
RBC: 4.1 MIL/uL (ref 3.87–5.11)
RDW: 12.6 % (ref 11.5–15.5)
WBC: 9 10*3/uL (ref 4.0–10.5)
nRBC: 0 % (ref 0.0–0.2)

## 2019-02-10 LAB — COMPREHENSIVE METABOLIC PANEL
ALT: 22 U/L (ref 0–44)
AST: 30 U/L (ref 15–41)
Albumin: 3.7 g/dL (ref 3.5–5.0)
Alkaline Phosphatase: 55 U/L (ref 38–126)
Anion gap: 9 (ref 5–15)
BILIRUBIN TOTAL: 0.4 mg/dL (ref 0.3–1.2)
BUN: 21 mg/dL (ref 8–23)
CO2: 26 mmol/L (ref 22–32)
CREATININE: 0.96 mg/dL (ref 0.44–1.00)
Calcium: 9.2 mg/dL (ref 8.9–10.3)
Chloride: 103 mmol/L (ref 98–111)
GFR, EST NON AFRICAN AMERICAN: 53 mL/min — AB (ref >60)
Glucose, Bld: 207 mg/dL — ABNORMAL HIGH (ref 70–99)
POTASSIUM: 4.6 mmol/L (ref 3.5–5.1)
Sodium: 138 mmol/L (ref 135–145)
TOTAL PROTEIN: 6.9 g/dL (ref 6.5–8.1)

## 2019-02-10 MED ORDER — FELODIPINE ER 5 MG PO TB24
5.0000 mg | ORAL_TABLET | Freq: Every day | ORAL | Status: DC
  Administered 2019-02-11 – 2019-02-13 (×3): 5 mg via ORAL
  Filled 2019-02-10 (×4): qty 1

## 2019-02-10 MED ORDER — ENOXAPARIN SODIUM 40 MG/0.4ML ~~LOC~~ SOLN
40.0000 mg | SUBCUTANEOUS | Status: DC
  Administered 2019-02-11 – 2019-02-13 (×3): 40 mg via SUBCUTANEOUS
  Filled 2019-02-10 (×3): qty 0.4

## 2019-02-10 MED ORDER — ACETAMINOPHEN 325 MG PO TABS
650.0000 mg | ORAL_TABLET | Freq: Once | ORAL | Status: AC
  Administered 2019-02-10: 650 mg via ORAL
  Filled 2019-02-10: qty 2

## 2019-02-10 MED ORDER — ACETAMINOPHEN 325 MG PO TABS
650.0000 mg | ORAL_TABLET | Freq: Four times a day (QID) | ORAL | Status: DC | PRN
  Administered 2019-02-11: 650 mg via ORAL
  Filled 2019-02-10: qty 2

## 2019-02-10 MED ORDER — GABAPENTIN 100 MG PO CAPS
100.0000 mg | ORAL_CAPSULE | Freq: Three times a day (TID) | ORAL | Status: DC
  Administered 2019-02-11 – 2019-02-13 (×8): 100 mg via ORAL
  Filled 2019-02-10 (×8): qty 1

## 2019-02-10 MED ORDER — OCUVITE PO TABS: 1.0000 | ORAL_TABLET | Freq: Every day | ORAL | Status: DC

## 2019-02-10 MED ORDER — SODIUM CHLORIDE 0.9 % IV SOLN
INTRAVENOUS | Status: DC
  Administered 2019-02-11: via INTRAVENOUS

## 2019-02-10 MED ORDER — MORPHINE SULFATE (PF) 2 MG/ML IV SOLN: 1.0000 mg | INTRAVENOUS | Status: DC | PRN

## 2019-02-10 MED ORDER — ASPIRIN EC 81 MG PO TBEC
81.0000 mg | DELAYED_RELEASE_TABLET | Freq: Every day | ORAL | Status: DC
  Administered 2019-02-11 – 2019-02-13 (×3): 81 mg via ORAL
  Filled 2019-02-10 (×3): qty 1

## 2019-02-10 MED ORDER — CLONIDINE HCL 0.1 MG PO TABS
0.1000 mg | ORAL_TABLET | Freq: Every day | ORAL | Status: DC
  Administered 2019-02-11 – 2019-02-12 (×2): 0.1 mg via ORAL
  Filled 2019-02-10 (×2): qty 1

## 2019-02-10 MED ORDER — SIMVASTATIN 20 MG PO TABS
40.0000 mg | ORAL_TABLET | Freq: Every day | ORAL | Status: DC
  Administered 2019-02-11 – 2019-02-12 (×2): 40 mg via ORAL
  Filled 2019-02-10 (×2): qty 2

## 2019-02-10 MED ORDER — TRAMADOL HCL 50 MG PO TABS: 50.0000 mg | ORAL_TABLET | Freq: Four times a day (QID) | ORAL | Status: DC | PRN

## 2019-02-10 MED ORDER — ONDANSETRON 4 MG PO TBDP
4.0000 mg | ORAL_TABLET | Freq: Once | ORAL | Status: AC
  Administered 2019-02-10: 4 mg via ORAL
  Filled 2019-02-10: qty 1

## 2019-02-10 MED ORDER — SODIUM CHLORIDE 0.9% FLUSH
3.0000 mL | Freq: Two times a day (BID) | INTRAVENOUS | Status: DC
  Administered 2019-02-11: 3 mL via INTRAVENOUS
  Administered 2019-02-11: via INTRAVENOUS
  Administered 2019-02-11 – 2019-02-13 (×4): 3 mL via INTRAVENOUS

## 2019-02-10 MED ORDER — ENALAPRIL MALEATE 20 MG PO TABS
20.0000 mg | ORAL_TABLET | Freq: Every day | ORAL | Status: DC
  Administered 2019-02-11 – 2019-02-13 (×3): 20 mg via ORAL
  Filled 2019-02-10 (×4): qty 1

## 2019-02-10 MED ORDER — CLONAZEPAM 0.5 MG PO TABS
0.5000 mg | ORAL_TABLET | Freq: Two times a day (BID) | ORAL | Status: DC
  Administered 2019-02-11: 1 mg via ORAL
  Administered 2019-02-11: 0.5 mg via ORAL
  Administered 2019-02-12 – 2019-02-13 (×3): 1 mg via ORAL
  Filled 2019-02-10 (×5): qty 2
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
## 2019-02-10 NOTE — ED Triage Notes (Signed)
Pt reports she was at home, had a couple of things in her hand when she tripped on a rug at home and fell backwards hitting her head on a stool.  She is not on blood thinners.  Small hematoma in back of her head noted.  Her daughter states pt is falling asleep and is now weak unable to stand up.

## 2019-02-10 NOTE — ED Provider Notes (Signed)
[facility] EM[facility]ENCY [facility] Provider Note   CSN: [id number] Arrival date & time: 02/10/19  1448     History   Chief Complaint Chief Complaint  Patient presents with  . Fall  . Head Injury    HPI Kim Nichols is a 85 y.o. female presenting for evaluation after fall.  Patient states she had her hands full when she was walking and slipped, falling backwards and hitting the back of her head. This occurred approximately 4 hrs PTA. She denies loss of consciousness.  She reports headache, which is initially at the back of her head and is now generalized.  She is not on blood thinners.  She is not taking anything for pain including Tylenol or ibuprofen.  She has ambulated since the fall.  She reports right shoulder pain, denies pain elsewhere.  She denies feeling dizzy, weak, having chest pain, shortness of breath prior to the fall.  She denies current chest pain, neck pain, back pain, abdominal pain, nausea, vomiting, loss of bowel bladder control.  Patient uses a cane when she ambulates, does not use a walker but does have a history of frequent falls.  Patient lives at home by herself. Additional history obtained from daughter.  Daughter states patient has been more tired over the past hour. Otherwise, is acting at baseline.  Digital history obtained from chart review.  Patient takes aspirin, no other blood thinners.  History of anxiety, diabetes, GERD, kidney stones, hypertension, urine incontinence, osteoporosis.  HPI  Past Medical History:  Diagnosis Date  . Anxiety disorder   . Chronic insomnia   . Diabetes mellitus without complication (HCC)   . Dyslipidemia   . History of gastroesophageal reflux (GERD)   . History of kidney stones   . Hypertension   . Incontinence of urine   . Osteoarthritis   . Osteoporosis   . Tooth infection    03-21-16 ISSUE RESOLVED    Patient Active Problem List   Diagnosis Date Noted  . Confusion 02/10/2019  . S/P right TKA  05/14/2016  . S/P knee replacement 05/14/2016  . Type II or unspecified type diabetes mellitus without mention of complication, not stated as uncontrolled 07/06/2013  . Femur fracture, right (HCC) 06/28/2013  . Acute encephalopathy 06/22/2013  . Delirium 06/22/2013  . DVT of leg (deep venous thrombosis) (HCC) 06/22/2013  . HTN (hypertension) 06/21/2013  . Hyperlipidemia 06/21/2013  . Anemia 06/21/2013  . Pneumonia 06/21/2013  . Acute respiratory failure (HCC) 06/21/2013    Past Surgical History:  Procedure Laterality Date  . CHOLECYSTECTOMY    . CYSTOSCOPY  YEARS AGO  . KNEE SURGERY Right 4-11   ARTHROSCOPY  . RIGHT HIP PINNING  JUNE 2014  . TOTAL KNEE ARTHROPLASTY Right 05/14/2016   Procedure: RIGHT TOTAL KNEE ARTHROPLASTY;  Surgeon: Durene RomansMatthew Olin, MD;  Location: [facility] ORS;  Service: Orthopedics;  Laterality: Right;     OB History   No obstetric history on file.      Home Medications    Prior to Admission medications   Medication Sig Start Date End Date Taking? Authorizing Provider  aspirin EC 81 MG tablet Take 81 mg by mouth daily.   Yes [provider]  clonazePAM (KLONOPIN) 1 MG tablet Take 1 tablet (1 mg total) by mouth at bedtime. Patient taking differently: Take 0.5-1 mg by mouth 2 (two) times daily. She takes half a tablet in the morning and one tablet at bedtime. 07/09/13  Yes Angiulli, Mcarthur Rossettianiel J, PA-C  beta carotene  w/minerals (OCUVITE) tablet Take 1 tablet by mouth daily.    [provider]  Calcium Carbonate-Vitamin D (CALCIUM 600 + D PO) Take 600 mg by mouth daily.     [provider]  cephALEXin (KEFLEX) 500 MG capsule Take 1 capsule (500 mg total) by mouth 4 (four) times daily. 07/13/17   Elpidio Anis, PA-C  cloNIDine (CATAPRES) 0.1 MG tablet Take 1 tablet (0.1 mg total) by mouth at bedtime. 07/09/13   Angiulli, Mcarthur Rossetti, PA-C  enalapril (VASOTEC) 20 MG tablet Take 1 tablet (20 mg total) by mouth daily. 07/09/13   Angiulli, Mcarthur Rossetti, PA-C   felodipine (PLENDIL) 5 MG 24 hr tablet Take 1 tablet (5 mg total) by mouth daily. 07/09/13   Angiulli, Mcarthur Rossetti, PA-C  ferrous sulfate 325 (65 FE) MG tablet Take 1 tablet (325 mg total) by mouth 3 (three) times daily after meals. 05/15/16   Lanney Gins, PA-C  gabapentin (NEURONTIN) 300 MG capsule TAKE ONE CAPSULE BY MOUTH THREE TIMES A DAY Patient taking differently: TAKE ONE CAPSULE BY MOUTH FOUR TIMES A DAY 09/20/13   Ranelle Oyster, MD  Melatonin 3 MG TABS Take 3 mg by mouth at bedtime.    [provider]  metFORMIN (GLUCOPHAGE) 500 MG tablet Take 500 mg by mouth 2 (two) times daily with a meal.  01/09/16   [provider]  MULTIPLE VITAMINS-MINERALS ER PO Take 1 tablet by mouth daily.     [provider]  Omega-3 Fatty Acids (FISH OIL) 1200 MG CAPS Take 1,200 mg by mouth daily.     [provider]  rivaroxaban (XARELTO) 10 MG TABS tablet Take 1 tablet (10 mg total) by mouth daily. 05/15/16 05/29/16  Lanney Gins, PA-C  simvastatin (ZOCOR) 40 MG tablet Take 1 tablet (40 mg total) by mouth at bedtime. 07/09/13   Angiulli, Mcarthur Rossetti, PA-C  tiZANidine (ZANAFLEX) 4 MG tablet Take 1 tablet (4 mg total) by mouth every 6 (six) hours as needed for muscle spasms. 05/15/16   Lanney Gins, PA-C  traMADol (ULTRAM) 50 MG tablet Take 1-2 tablets (50-100 mg total) by mouth every 6 (six) hours as needed for moderate pain or severe pain. 05/15/16   Lanney Gins, PA-C    Family History Family History  Problem Relation Age of Onset  . Heart attack Mother   . Hypertension Mother   . Heart attack Sister   . Hypertension Brother        x2  . Stroke Neg Hx     Social History Social History   Tobacco Use  . Smoking status: Never Smoker  . Smokeless tobacco: Never Used  Substance Use Topics  . Alcohol use: No  . Drug use: No     Allergies   Naproxen and Dilaudid [hydromorphone]   Review of Systems Review of Systems  Musculoskeletal: Positive for  arthralgias (Right shoulder pain).  Neurological: Positive for headaches.  All other systems reviewed and are negative.    Physical Exam Updated Vital Signs BP (!) 152/64   Pulse 84   Temp (!) 101.4 F (38.6 C) (Oral)   Resp 17   Ht 5\' 5"  (1.651 m)   Wt 56.7 kg   SpO2 95%   BMI 20.80 kg/m   Physical Exam Vitals signs and nursing note reviewed.  Constitutional:      General: She is not in acute distress.    Appearance: She is well-developed.     Comments: Elderly female resting in the bed in  no acute distress  HENT:     Head: Normocephalic and atraumatic.     Comments: No obvious hematoma, contusion, or laceration.  No hemotympanum or nasal septal hematoma. Eyes:     Extraocular Movements: Extraocular movements intact.     Conjunctiva/sclera: Conjunctivae normal.     Pupils: Pupils are equal, round, and reactive to light.     Comments: EOMI and PERRLA.  Neck:     Musculoskeletal: Normal range of motion and neck supple.     Comments: In c-collar initially.  After imaging, c-collar removed and no step offs or significant ttp of c-spine Cardiovascular:     Rate and Rhythm: Normal rate and regular rhythm.     Pulses: Normal pulses.  Pulmonary:     Effort: Pulmonary effort is normal. No respiratory distress.     Breath sounds: Normal breath sounds. No wheezing.     Comments: No tenderness palpation of the chest wall Chest:     Chest wall: No tenderness.  Abdominal:     General: Bowel sounds are normal. There is no distension.     Palpations: Abdomen is soft.     Tenderness: There is no abdominal tenderness.     Comments: No tenderness palpation of the abdomen  Musculoskeletal: Normal range of motion.     Comments: No obvious deformity of either shoulder noted.  Abrasion noted of the lateral left shoulder, but no tenderness to palpation.  Patient reporting pain of her right shoulder, no obvious contusion, swelling, erythema, or injury.  Radial pulses intact bilaterally.   Grip strength intact bilaterally. No obvious injury to the lower extremities.  Patient is ambulatory.  Skin:    General: Skin is warm and dry.     Capillary Refill: Capillary refill takes less than 2 seconds.  Neurological:     General: No focal deficit present.     Mental Status: She is alert and oriented to person, place, and time.      ED Treatments / Results  Labs (all labs ordered are listed, but only abnormal results are displayed) Labs Reviewed  CBC WITH DIFFERENTIAL/PLATELET - Abnormal; Notable for the following components:      Result Value   Hemoglobin 11.7 (*)    Neutro Abs 8.2 (*)    Lymphs Abs 0.5 (*)    All other components within normal limits  COMPREHENSIVE METABOLIC PANEL - Abnormal; Notable for the following components:   Glucose, Bld 207 (*)    GFR calc non Af Amer 53 (*)    All other components within normal limits  URINALYSIS, ROUTINE W REFLEX MICROSCOPIC - Abnormal; Notable for the following components:   Ketones, ur 5 (*)    All other components within normal limits  CULTURE, BLOOD (ROUTINE X 2)  CULTURE, BLOOD (ROUTINE X 2)  RESPIRATORY PANEL BY PCR  COMPREHENSIVE METABOLIC PANEL  CBC WITH DIFFERENTIAL/PLATELET  PROCALCITONIN  PROCALCITONIN    EKG EKG Interpretation  Date/Time:  Wednesday February 10 2019 16:11:43 EST Ventricular Rate:  77 PR Interval:    QRS Duration: 101 QT Interval:  397 QTC Calculation: 450 R Axis:   -27 Text Interpretation:  Sinus rhythm Borderline left axis deviation Low voltage, precordial leads Baseline wander TECHNICALLY DIFFICULT No old tracing to compare Confirmed by Melene Plan [phone]) on 02/10/2019 4:18:21 PM   Radiology Dg Chest 2 View  Result Date: 02/10/2019 CLINICAL DATA:  Fever, chest pain EXAM: CHEST - 2 VIEW COMPARISON:  11/27/2016 FINDINGS: Heart and mediastinal contours are within normal  limits. No focal opacities or effusions. No acute bony abnormality. IMPRESSION: No active cardiopulmonary disease.  Electronically Signed   By: Charlett NoseKevin  Dover M.D.   On: 02/10/2019 21:17   Dg Shoulder Right  Result Date: 02/10/2019 CLINICAL DATA:  85 year old female with a history of fall and shoulder pain EXAM: RIGHT SHOULDER - 2+ VIEW COMPARISON:  None. FINDINGS: Osteopenia. No acute displaced fracture of the humerus, scapula, or the visualized clavicle. Degenerative changes of the acromioclavicular joint. Glenohumeral joint appears congruent. Unremarkable scapular Y-view. No focal soft tissue swelling. IMPRESSION: Negative for acute bony abnormality. Degenerative changes of the acromioclavicular joint. Electronically Signed   By: Gilmer MorJaime  Wagner D.O.   On: 02/10/2019 18:46   Ct Head Wo Contrast  Result Date: 02/10/2019 CLINICAL DATA:  Fall EXAM: CT HEAD WITHOUT CONTRAST CT CERVICAL SPINE WITHOUT CONTRAST TECHNIQUE: Multidetector CT imaging of the head and cervical spine was performed following the standard protocol without intravenous contrast. Multiplanar CT image reconstructions of the cervical spine were also generated. COMPARISON:  07/13/2017 FINDINGS: CT HEAD FINDINGS Brain: No evidence of acute infarction, hemorrhage, hydrocephalus, extra-axial collection or mass lesion/mass effect. Periventricular white matter hypodensity. Vascular: No hyperdense vessel or unexpected calcification. Skull: Normal. Negative for fracture or focal lesion. Sinuses/Orbits: No acute finding. Other: None. CT CERVICAL SPINE FINDINGS Alignment: Normal. Skull base and vertebrae: No acute fracture. No primary bone lesion or focal pathologic process. 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 Soft tissues and spinal canal: No prevertebral fluid or swelling. No visible canal hematoma. Disc levels: Moderate multilevel disc and facet degenerative disease of the cervical spine. Upper chest: Negative. Other: None. IMPRESSION: 1.  No acute intracranial pathology. 2.  Small-vessel white matter disease. 3.  No fracture or static subluxation  of the cervical spine. 4.  Moderate multilevel disc and facet degenerative disease. Electronically Signed   By: Lauralyn PrimesAlex  Bibbey M.D.   On: 02/10/2019 16:13   Ct Cervical Spine Wo Contrast  Result Date: 02/10/2019 CLINICAL DATA:  Fall EXAM: CT HEAD WITHOUT CONTRAST CT CERVICAL SPINE WITHOUT CONTRAST TECHNIQUE: Multidetector CT imaging of the head and cervical spine was performed following the standard protocol without intravenous contrast. Multiplanar CT image reconstructions of the cervical spine were also generated. COMPARISON:  07/13/2017 FINDINGS: CT HEAD FINDINGS Brain: No evidence of acute infarction, hemorrhage, hydrocephalus, extra-axial collection or mass lesion/mass effect. Periventricular white matter hypodensity. Vascular: No hyperdense vessel or unexpected calcification. Skull: Normal. Negative for fracture or focal lesion. Sinuses/Orbits: No acute finding. Other: None. CT CERVICAL SPINE FINDINGS Alignment: Normal. Skull base and vertebrae: No acute fracture. No primary bone lesion or focal pathologic process. 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 Soft tissues and spinal canal: No prevertebral fluid or swelling. No visible canal hematoma. Disc levels: Moderate multilevel disc and facet degenerative disease of the cervical spine. Upper chest: Negative. Other: None. IMPRESSION: 1.  No acute intracranial pathology. 2.  Small-vessel white matter disease. 3.  No fracture or static subluxation of the cervical spine. 4.  Moderate multilevel disc and facet degenerative disease. Electronically Signed   By: Lauralyn PrimesAlex  Bibbey M.D.   On: 02/10/2019 16:13    Procedures Procedures (including critical care time)  Medications Ordered in ED Medications  aspirin EC tablet 81 mg (has no administration in time range)  cloNIDine (CATAPRES) tablet 0.1 mg (has no administration in time range)  enalapril (VASOTEC) tablet 20 mg (has no administration in time range)  felodipine (PLENDIL) 24 hr tablet 5 mg  (has no administration  in time range)  simvastatin (ZOCOR) tablet 40 mg (has no administration in time range)  clonazePAM (KLONOPIN) tablet 0.5-1 mg (has no administration in time range)  gabapentin (NEURONTIN) capsule 100 mg (has no administration in time range)  beta carotene w/minerals (OCUVITE) tablet 1 tablet (has no administration in time range)  enoxaparin (LOVENOX) injection 40 mg (has no administration in time range)  sodium chloride flush (NS) 0.9 % injection 3 mL (has no administration in time range)  0.9 %  sodium chloride infusion (has no administration in time range)  acetaminophen (TYLENOL) tablet 650 mg (has no administration in time range)  morphine 2 MG/ML injection 1 mg (has no administration in time range)  acetaminophen (TYLENOL) tablet 650 mg (650 mg Oral Given 02/10/19 1619)  ondansetron (ZOFRAN-ODT) disintegrating tablet 4 mg (4 mg Oral Given 02/10/19 1650)  acetaminophen (TYLENOL) tablet 650 mg (650 mg Oral Given 02/10/19 2134)     Initial Impression / Assessment and Plan / ED Course  I have reviewed the triage vital signs and the nursing notes.  Pertinent labs & imaging results that were available during my care of the patient were reviewed by me and considered in my medical decision making (see chart for details).     Pt presenting for evaluation after a fall.  Physical exam reassuring, she is neurovascularly intact.  Fall was mechanical.  Considering age and severe headache, will obtain CT head and neck.  Patient also reporting right shoulder pain, although no deformity noted. Will order xray.  Case discussed with attending, Dr. Adela Lank evaluated the pt.   CT head and neck reassuring.  X-ray viewed interpreted by me, no fracture dislocation.  Discussed with patient's daughter, who states that since they first arrived, patient has had episodes of confusion, where she does not know where she is or why her dog is not here.  Per daughter, this is very abnormal.   Additionally, patient is warm to the touch, will recheck temperature.  Temperature elevated at 101.5.  As such, will order chest x-ray, urine, and labs to assess for infection.  Chest x-ray viewed interpreted by me, no pneumonia, pneumothorax, effusion, or cardiomegaly.  Urine without infection.  Labs reassuring, no leukocytosis.  Patient ambulated, but is weaker and shakier than normal.  Daughter does not feel patient is safe to go home, as she lives by herself. Will call for admission due to fever and weakness.   Discussed with Dr. Rema Jasmine from [facility], pt to be admitted.   Final Clinical Impressions(s) / ED Diagnoses   Final diagnoses:  Fall, initial encounter  Injury of head, initial encounter  Acute pain of right shoulder  Fever, unspecified fever cause    ED Discharge Orders    None       Alveria Apley, PA-C 02/10/19 2349    Melene Plan, DO 02/11/19 1503

## 2019-02-10 NOTE — ED Notes (Addendum)
Patient transported to CT 

## 2019-02-10 NOTE — Discharge Instructions (Addendum)
No driving until see by your PCP Use walker at all times

## 2019-02-10 NOTE — ED Notes (Signed)
Admitting at bedside 

## 2019-02-11 DIAGNOSIS — Y92009 Unspecified place in unspecified non-institutional (private) residence as the place of occurrence of the external cause: Secondary | ICD-10-CM

## 2019-02-11 DIAGNOSIS — S0990XA Unspecified injury of head, initial encounter: Secondary | ICD-10-CM

## 2019-02-11 DIAGNOSIS — R509 Fever, unspecified: Secondary | ICD-10-CM | POA: Diagnosis not present

## 2019-02-11 DIAGNOSIS — W19XXXA Unspecified fall, initial encounter: Secondary | ICD-10-CM

## 2019-02-11 LAB — RESPIRATORY PANEL BY PCR

## 2019-02-11 LAB — CBC WITH DIFFERENTIAL/PLATELET
Abs Immature Granulocytes: 0.02 10*3/uL (ref 0.00–0.07)
Basophils Absolute: 0 10*3/uL (ref 0.0–0.1)
Basophils Relative: 0 %
Eosinophils Absolute: 0 10*3/uL (ref 0.0–0.5)
Eosinophils Relative: 0 %
HCT: 34.9 % — ABNORMAL LOW (ref 36.0–46.0)
Hemoglobin: 11 g/dL — ABNORMAL LOW (ref 12.0–15.0)
Immature Granulocytes: 0 %
LYMPHS PCT: 10 %
Lymphs Abs: 0.9 10*3/uL (ref 0.7–4.0)
MCH: 28.6 pg (ref 26.0–34.0)
MCHC: 31.5 g/dL (ref 30.0–36.0)
MCV: 90.9 fL (ref 80.0–100.0)
Monocytes Absolute: 0.5 10*3/uL (ref 0.1–1.0)
Monocytes Relative: 6 %
Neutro Abs: 6.9 10*3/uL (ref 1.7–7.7)
Neutrophils Relative %: 84 %
PLATELETS: 221 10*3/uL (ref 150–400)
RBC: 3.84 MIL/uL — ABNORMAL LOW (ref 3.87–5.11)
RDW: 12.7 % (ref 11.5–15.5)
WBC: 8.3 10*3/uL (ref 4.0–10.5)
nRBC: 0 % (ref 0.0–0.2)

## 2019-02-11 LAB — COMPREHENSIVE METABOLIC PANEL
ALT: 20 U/L (ref 0–44)
AST: 32 U/L (ref 15–41)
Albumin: 3.5 g/dL (ref 3.5–5.0)
Alkaline Phosphatase: 51 U/L (ref 38–126)
Anion gap: 10 (ref 5–15)
BUN: 21 mg/dL (ref 8–23)
CO2: 25 mmol/L (ref 22–32)
Calcium: 9 mg/dL (ref 8.9–10.3)
Chloride: 101 mmol/L (ref 98–111)
Creatinine, Ser: 0.97 mg/dL (ref 0.44–1.00)
GFR calc Af Amer: >60 mL/min (ref >60)
GFR calc non Af Amer: 53 mL/min — ABNORMAL LOW (ref >60)
Glucose, Bld: 138 mg/dL — ABNORMAL HIGH (ref 70–99)
Potassium: 4.2 mmol/L (ref 3.5–5.1)
Sodium: 136 mmol/L (ref 135–145)
Total Bilirubin: 0.6 mg/dL (ref 0.3–1.2)
Total Protein: 6.7 g/dL (ref 6.5–8.1)

## 2019-02-11 LAB — PROCALCITONIN: Procalcitonin: <0.1 ng/mL

## 2019-02-11 LAB — GLUCOSE, CAPILLARY: Glucose-Capillary: 99 mg/dL (ref 70–99)

## 2019-02-11 MED ORDER — PROSIGHT PO TABS
1.0000 | ORAL_TABLET | Freq: Every day | ORAL | Status: DC
  Administered 2019-02-11 – 2019-02-13 (×3): 1 via ORAL
  Filled 2019-02-11 (×3): qty 1

## 2019-02-11 NOTE — Progress Notes (Signed)
PT Cancellation Note  Patient Details Name: Kim MarseillesMable Pless MRN: [id number] DOB: 03-29-1929   Cancelled Treatment:    Reason Eval/Treat Not Completed: Other (comment).  Pt was being visited by her minister who asked PT to come back later.  Will return to see if she is able to attempt PT eval.   Ivar Drapeuth E Giannis Corpuz 02/11/2019, 11:02 AM  Samul Dadauth Abrham Maslowski, PT MS Acute Rehab Dept. Number: [facility] [id number] [phone] and [facility] [phone]

## 2019-02-11 NOTE — Clinical Social Work Note (Signed)
Clinical Social Work Assessment  Patient Details  Name: Kim Nichols MRN: [id number] Date of Birth: [birth year]  Date of referral:  02/11/19               Reason for consult:  Facility Placement                Permission sought to share information with:  Family Supports Permission granted to share information::  Yes, Verbal Permission Granted  Name::     Biomedical engineer::     Relationship::  Daughter  Contact Information:     Housing/Transportation Living arrangements for the past 2 months:  Single Family Home Source of Information:  Patient Patient Interpreter Needed:  None Criminal Activity/Legal Involvement Pertinent to Current Situation/Hospitalization:  No - Comment as needed Significant Relationships:  Adult Children Lives with:  Self Do you feel safe going back to the place where you live?  No Need for family participation in patient care:  No (Coment)  Care giving concerns:  Pt is alert and oriented. Pt has two daughters who serve as supports.  Social Worker assessment / plan:  Pt lives alone. Pt wants to discuss SNF with her daughter, Wille Celeste. Pt gave CSW verbal permission to call pt's daughter to discuss SNF. Pt is not familiar with SNF. Pt lives in [place]. Pt verbalized permission for CSW to send referral to facilities in [place] and [place]. CSW called pt's daughter--left voicemail.  Employment status:  Retired Database administrator PT Recommendations:  Skilled Nursing Facility Information / Referral to community resources:  Skilled Nursing Facility  Patient/Family's Response to care:  Pt verbalized understanding of CSW role and expressed appreciation for support. Pt denies any concern regarding pt care at this time.   Patient/Family's Understanding of and Emotional Response to Diagnosis, Current Treatment, and Prognosis:  Pt understanding and realistic regarding physical limitations. Pt understands the need for SNF placement at d/c. Pt agreeable to  SNF placement at d/c, at this time. Pt's responses emotionally appropriate during conversation with CSW. Pt denies any concern regarding treatment plan at this time. CSW will continue to provide support and facilitate d/c needs.   Emotional Assessment Appearance:  Appears stated age Attitude/Demeanor/Rapport:  (patient was appropriate) Affect (typically observed):  Accepting, Appropriate, Calm Orientation:  Oriented to Self, Oriented to Place, Oriented to  Time, Oriented to Situation Alcohol / Substance use:  Not Applicable Psych involvement (Current and /or in the community):  No (Comment)  Discharge Needs  Concerns to be addressed:  Care Coordination, Basic Needs Readmission within the last 30 days:  No Current discharge risk:  Dependent with Mobility Barriers to Discharge:  Continued Medical Work up   [employer], LCSW 02/11/2019, 4:03 PM

## 2019-02-11 NOTE — [place] FL2 (Signed)
[place] MEDICAID FL2 LEVEL OF CARE SCREENING TOOL     IDENTIFICATION  Patient Name: Kim MarseillesMable Waldroup Birthdate: 02/05/1929 Sex: female Admission Date (Current Location): 02/10/2019  [facility] [place]   Facility and Address:  [facility], [address], [place], [place] [address]      Provider Number: [id number]  Attending Physician Name and Address:  Joseph ArtVann, Jessica U, DO  Relative Name and Phone Number:       Current Level of Care: Hospital Recommended Level of Care: Skilled Nursing Facility Prior Approval Number:    Date Approved/Denied:   PASRR Number:  [id number]   Discharge Plan: SNF    Current Diagnoses: Patient Active Problem List   Diagnosis Date Noted  . Confusion 02/10/2019  . S/P right TKA 05/14/2016  . S/P knee replacement 05/14/2016  . Type II or unspecified type diabetes mellitus without mention of complication, not stated as uncontrolled 07/06/2013  . Femur fracture, right (HCC) 06/28/2013  . Acute encephalopathy 06/22/2013  . Delirium 06/22/2013  . DVT of leg (deep venous thrombosis) (HCC) 06/22/2013  . HTN (hypertension) 06/21/2013  . Hyperlipidemia 06/21/2013  . Anemia 06/21/2013  . Pneumonia 06/21/2013  . Acute respiratory failure (HCC) 06/21/2013    Orientation RESPIRATION BLADDER Height & Weight     Self, Situation, Place, Time  Normal Continent Weight: 125 lb (56.7 kg) Height:  5\' 5"  (165.1 cm)  BEHAVIORAL SYMPTOMS/MOOD NEUROLOGICAL BOWEL NUTRITION STATUS      Continent Diet(regular diet, thin liquids)  AMBULATORY STATUS COMMUNICATION OF NEEDS Skin   Limited Assist Verbally Normal                       Personal Care Assistance Level of Assistance  Dressing, Feeding, Bathing Bathing Assistance: Limited assistance Feeding assistance: Independent Dressing Assistance: Limited assistance     Functional Limitations Info  Sight, Hearing, Speech Sight Info: Adequate Hearing Info:  Adequate Speech Info: Adequate    SPECIAL CARE FACTORS FREQUENCY  PT (By licensed PT), OT (By licensed OT)     PT Frequency: 2x OT Frequency: 2x            Contractures Contractures Info: Not present    Additional Factors Info  Code Status, Allergies, Isolation Precautions Code Status Info: DNR Allergies Info: Naproxen, Dilaudid Hydromorphone     Isolation Precautions Info: Droplet Precautions     Current Medications (02/11/2019):  This is the current hospital active medication list Current Facility-Administered Medications  Medication Dose Route Frequency Provider Last Rate Last Dose  . acetaminophen (TYLENOL) tablet 650 mg  650 mg Oral Q6H PRN Elder LoveKuhlman, Patrick D, MD   650 mg at 02/11/19 0021  . aspirin EC tablet 81 mg  81 mg Oral Daily Elder LoveKuhlman, Patrick D, MD   81 mg at 02/11/19 1124  . clonazePAM (KLONOPIN) tablet 0.5-1 mg  0.5-1 mg Oral BID Elder LoveKuhlman, Patrick D, MD   0.5 mg at 02/11/19 1124  . cloNIDine (CATAPRES) tablet 0.1 mg  0.1 mg Oral QHS Elder LoveKuhlman, Patrick D, MD      . enalapril (VASOTEC) tablet 20 mg  20 mg Oral Daily Elder LoveKuhlman, Patrick D, MD   20 mg at 02/11/19 1125  . enoxaparin (LOVENOX) injection 40 mg  40 mg Subcutaneous Q24H Elder LoveKuhlman, Patrick D, MD   40 mg at 02/11/19 1132  . felodipine (PLENDIL) 24 hr tablet 5 mg  5 mg Oral Daily Elder LoveKuhlman, Patrick D, MD   5 mg at 02/11/19  1125  . gabapentin (NEURONTIN) capsule 100 mg  100 mg Oral TID Elder Love, MD   100 mg at 02/11/19 1125  . multivitamin (PROSIGHT) tablet 1 tablet  1 tablet Oral Daily Marlin Canary U, DO   1 tablet at 02/11/19 1142  . simvastatin (ZOCOR) tablet 40 mg  40 mg Oral QHS Elder Love, MD      . sodium chloride flush (NS) 0.9 % injection 3 mL  3 mL Intravenous Q12H Elder Love, MD   3 mL at 02/11/19 1132     Discharge Medications: Please see discharge summary for a list of discharge medications.  Relevant Imaging Results:  Relevant Lab Results:   Additional  Information SSN: [id number]  Kim Krabbe, LCSW

## 2019-02-11 NOTE — Evaluation (Deleted)
Physical Therapy Evaluation Patient Details Name: Kim Nichols MRN: [id number] DOB: [birth year] Today's Date: 02/11/2019   History of Present Illness  Kim Nichols is a 85 y.o. female with known COPD and nicotine dependence presented [facility] ED [facility] with shortness of breath.  Her symptoms have been much worse today.  She used albuterol earlier today but unable to get symptoms under control.  She also has difficulty with a generalized anxiety disorder and has been out of her Klonopin and has not been able to get a refill from her physician.  She has been tearful and concerned about not being able to have her Klonopin refilled.  She reports that her breathing has been more labored and she has had significant wheezing and coughing and productive cough with whitish sputum over the past day.  The patient has had some pleuritic chest pain symptoms associated with taking a deep breath and coughing.  She denies high fever but has had low-grade fever.  The patient denies emesis, nausea and abdominal pain.  Clinical Impression  Pt was seen for mobility and strength testing after being admitted from a fall and severe change in abilities last PM.  Her PLOF was to walk on RW at home with daughter checking on her regularly in pt's senior accessible place.  Follow acutely for strength and balance, to increase endurance and increase comfortable mobility with LLE.  Will progress as tolerated as pt is motivated to be home soon.  Anticipate fall risk is high if home alone, and will need 24/7 care for the immediate short term to give her time to improve mobility.    Follow Up Recommendations SNF    Equipment Recommendations  Rolling walker with 5" wheels(if pt does not have a walker in good repair)    Recommendations for Other Services       Precautions / Restrictions Precautions Precautions: Fall(pain on L hip and thigh) Precaution Comments: HOH Restrictions Weight Bearing Restrictions: No       Mobility  Bed Mobility Overal bed mobility: Needs Assistance Bed Mobility: Supine to Sit     Supine to sit: Min assist     General bed mobility comments: pt is helping to get OOB with bedrail and using LE's  Transfers Overall transfer level: Needs assistance Equipment used: Rolling walker (2 wheeled);1 person hand held assist Transfers: Sit to/from Stand Sit to Stand: Min assist         General transfer comment: minor cues for hand placement  Ambulation/Gait Ambulation/Gait assistance: Min assist Gait Distance (Feet): 30 Feet Assistive device: Rolling walker (2 wheeled);1 person hand held assist Gait Pattern/deviations: Step-to pattern;Step-through pattern;Shuffle;Decreased stride length;Trunk flexed;Wide base of support Gait velocity: reduced Gait velocity interpretation: <1.31 ft/sec, indicative of household ambulator General Gait Details: struggles with pain of LLE and to turn walker (bari walker)  Stairs            Wheelchair Mobility    Modified Rankin (Stroke Patients Only)       Balance Overall balance assessment: (pt is up on RW but listing to L at times and struggling to t)                                           Pertinent Vitals/Pain Pain Assessment: Faces Faces Pain Scale: Hurts little more Pain Location: L hip with mobility after falling Pain Descriptors / Indicators:  Tender;Sore Pain Intervention(s): Limited activity within patient's tolerance;Monitored during session;Premedicated before session;Repositioned    Home Living Family/patient expects to be discharged to:: Private residence Living Arrangements: Alone Available Help at Discharge: Family;Available PRN/intermittently Type of Home: Apartment Home Access: Level entry     Home Layout: One level Home Equipment: Walker - 2 wheels;Shower seat;Cane - single point Additional Comments: pt is clear that she is using an AD based on how she is feeling    Prior  Function Level of Independence: Independent with assistive device(s)         Comments: despite falling     Hand Dominance   Dominant Hand: Right    Extremity/Trunk Assessment   Upper Extremity Assessment Upper Extremity Assessment: Generalized weakness    Lower Extremity Assessment Lower Extremity Assessment: Generalized weakness;LLE deficits/detail LLE Deficits / Details: painful LLE and is minimizing wb on that leg LLE: Unable to fully assess due to pain LLE Coordination: decreased fine motor;decreased gross motor    Cervical / Trunk Assessment Cervical / Trunk Assessment: Kyphotic  Communication   Communication: HOH  Cognition Arousal/Alertness: Awake/alert Behavior During Therapy: Impulsive Overall Cognitive Status: No family/caregiver present to determine baseline cognitive functioning                                 General Comments: pt is requiring repeated information but not sure if this is all hearing or her cognition given she does not have augmented hearing devices      General Comments General comments (skin integrity, edema, etc.): pt was noting her pain on LLE with some difficulty with what had happened.  Pt is aware she has fallen but is not able to give many details due to confusion at beginning of her trip to hosp.    Exercises     Assessment/Plan    PT Assessment Patient needs continued PT services  PT Problem List Decreased strength;Decreased range of motion;Decreased activity tolerance;Decreased balance;Decreased mobility;Decreased coordination;Decreased knowledge of use of DME;Decreased safety awareness;Decreased knowledge of precautions;Pain;Decreased skin integrity       PT Treatment Interventions DME instruction;Gait training;Therapeutic activities;Functional mobility training;Therapeutic exercise;Balance training;Neuromuscular re-education;Patient/family education    PT Goals (Current goals can be found in the Care Plan  section)  Acute Rehab PT Goals Patient Stated Goal: to walk to BR PT Goal Formulation: With patient Time For Goal Achievement: 02/25/19 Potential to Achieve Goals: Good    Frequency Min 2X/week   Barriers to discharge Decreased caregiver support home alone during the day    Co-evaluation               AM-PAC PT "6 Clicks" Mobility  Outcome Measure Help needed turning from your back to your side while in a flat bed without using bedrails?: A Little Help needed moving from lying on your back to sitting on the side of a flat bed without using bedrails?: A Little Help needed moving to and from a bed to a chair (including a wheelchair)?: A Little Help needed standing up from a chair using your arms (e.g., wheelchair or bedside chair)?: A Little Help needed to walk in hospital room?: A Little Help needed climbing 3-5 steps with a railing? : Total 6 Click Score: 16    End of Session Equipment Utilized During Treatment: Gait belt Activity Tolerance: Patient limited by pain;Patient limited by fatigue Patient left: in chair;with call bell/phone within reach;with nursing/sitter in room(nursing aware that pt has no  alarm pad due to none on hall) Nurse Communication: Mobility status PT Visit Diagnosis: Unsteadiness on feet (R26.81);Muscle weakness (generalized) (M62.81);Difficulty in walking, not elsewhere classified (R26.2);Pain Pain - Right/Left: Left Pain - part of body: Hip    Time: [id number] PT Time Calculation (min) (ACUTE ONLY): 28 min   Charges:   PT Evaluation $PT Eval Moderate Complexity: 1 Mod PT Treatments $Gait Training: 8-22 mins       Ivar DrapeRuth E Jakiyah Stepney 02/11/2019, 12:44 PM  Samul Dadauth Daja Shuping, PT MS Acute Rehab Dept. Number: [facility] [phone] and [facility] [phone]

## 2019-02-11 NOTE — Progress Notes (Signed)
Pt was seen for mobility and strength testing after being admitted from a fall and severe change in abilities last PM.  Her PLOF was to walk on RW at home with daughter checking on her regularly in pt's senior accessible place.  Follow acutely for strength and balance, to increase endurance and increase comfortable mobility with LLE.  Will progress as tolerated as pt is motivated to be home soon.  Anticipate fall risk is high if home alone, and will need 24/7 care for the immediate short term to give her time to improve mobility.   02/11/19 1200  PT Visit Information  Last PT Received On 02/11/19  History of Present Illness 85 year old woman with medical problems including elevated cardiac risk on a statin and aspirin, non-insulin-dependent diabetes, history of DVT no longer on anticoagulation, chronic anxiety on chronic benzodiazepine therapy, chronic musculoskeletal pain presenting with mechanical fall  Precautions  Precautions Fall (pain on L hip and thigh)  Precaution Comments HOH  Restrictions  Weight Bearing Restrictions No  Home Living  Family/patient expects to be discharged to: Private residence  Living Arrangements Alone  Available Help at Discharge Family;Available PRN/intermittently  Type of Home Apartment  Home Access Level entry  Home Layout One level  Horticulturist, commercialBathroom Toilet Standard  Bathroom Accessibility Yes  Home Equipment Walker - 2 wheels;Shower seat;Cane - single point  Additional Comments pt is clear that she is using an AD based on how she is feeling  Prior Function  Level of Independence Independent with assistive device(s)  Comments despite falling  Communication  Communication HOH  Pain Assessment  Pain Assessment Faces  Faces Pain Scale 4  Pain Location L hip with mobility after falling  Pain Descriptors / Indicators Tender;Sore  Pain Intervention(s) Limited activity within patient's tolerance;Monitored during session;Premedicated before session;Repositioned   Cognition  Arousal/Alertness Awake/alert  Behavior During Therapy Impulsive  Overall Cognitive Status No family/caregiver present to determine baseline cognitive functioning  General Comments pt is requiring repeated information but not sure if this is all hearing or her cognition given she does not have augmented hearing devices  Upper Extremity Assessment  Upper Extremity Assessment Generalized weakness  Lower Extremity Assessment  Lower Extremity Assessment Generalized weakness;LLE deficits/detail  LLE Deficits / Details painful LLE and is minimizing wb on that leg  LLE Unable to fully assess due to pain  LLE Coordination decreased fine motor;decreased gross motor  Cervical / Trunk Assessment  Cervical / Trunk Assessment Kyphotic  Bed Mobility  Overal bed mobility Needs Assistance  Bed Mobility Supine to Sit  Supine to sit Min assist  General bed mobility comments pt is helping to get OOB with bedrail and using LE's  Transfers  Overall transfer level Needs assistance  Equipment used Rolling walker (2 wheeled);1 person hand held assist  Transfers Sit to/from Stand  Sit to Stand Min assist  General transfer comment minor cues for hand placement  Ambulation/Gait  Ambulation/Gait assistance Min assist  Gait Distance (Feet) 30 Feet  Assistive device Rolling walker (2 wheeled);1 person hand held assist  Gait Pattern/deviations Step-to pattern;Step-through pattern;Shuffle;Decreased stride length;Trunk flexed;Wide base of support  General Gait Details struggles with pain of LLE and to turn walker (bari walker)  Gait velocity reduced  Gait velocity interpretation <1.31 ft/sec, indicative of household ambulator  Balance  Overall balance assessment  (pt is up on RW but listing to L at times and struggling to t)  General Comments  General comments (skin integrity, edema, etc.) pt was noting her pain on  LLE with some difficulty with what had happened.  Pt is aware she has fallen but is  not able to give many details due to confusion at beginning of her trip to hosp.  Exercises  Exercises General Lower Extremity (leg strength is 3+ to 4- LLE and 4+ to RLE)  PT - End of Session  Equipment Utilized During Treatment Gait belt  Activity Tolerance Patient limited by pain;Patient limited by fatigue  Patient left in chair;with call bell/phone within reach;with nursing/sitter in room (nursing aware that pt has no alarm pad due to none on hall)  Nurse Communication Mobility status  PT Assessment  PT Recommendation/Assessment Patient needs continued PT services  PT Visit Diagnosis Unsteadiness on feet (R26.81);Muscle weakness (generalized) (M62.81);Difficulty in walking, not elsewhere classified (R26.2);Pain  Pain - Right/Left Left  Pain - part of body Hip  PT Problem List Decreased strength;Decreased range of motion;Decreased activity tolerance;Decreased balance;Decreased mobility;Decreased coordination;Decreased knowledge of use of DME;Decreased safety awareness;Decreased knowledge of precautions;Pain;Decreased skin integrity  Barriers to Discharge Decreased caregiver support  Barriers to Discharge Comments home alone during the day  PT Plan  PT Frequency (ACUTE ONLY) Min 2X/week  PT Treatment/Interventions (ACUTE ONLY) DME instruction;Gait training;Therapeutic activities;Functional mobility training;Therapeutic exercise;Balance training;Neuromuscular re-education;Patient/family education  AM-PAC PT "6 Clicks" Mobility Outcome Measure (Version 2)  Help needed turning from your back to your side while in a flat bed without using bedrails? 3  Help needed moving from lying on your back to sitting on the side of a flat bed without using bedrails? 3  Help needed moving to and from a bed to a chair (including a wheelchair)? 3  Help needed standing up from a chair using your arms (e.g., wheelchair or bedside chair)? 3  Help needed to walk in hospital room? 3  Help needed climbing 3-5  steps with a railing?  1  6 Click Score 16  Consider Recommendation of Discharge To: Home with [facility]  PT Recommendation  Follow Up Recommendations SNF  PT equipment Rolling walker with 5" wheels (if pt does not have a walker in good repair)  Individuals Consulted  Consulted and Agree with Results and Recommendations Patient  Acute Rehab PT Goals  Patient Stated Goal to walk to BR  PT Goal Formulation With patient  Time For Goal Achievement 02/25/19  Potential to Achieve Goals Good  PT Time Calculation  PT Start Time (ACUTE ONLY) 1058  PT Stop Time (ACUTE ONLY) 1126  PT Time Calculation (min) (ACUTE ONLY) 28 min  PT General Charges  $$ ACUTE PT VISIT 1 Visit  PT Evaluation  $PT Eval Moderate Complexity 1 Mod  PT Treatments  $Gait Training 8-22 mins  Written Expression  Dominant Hand Right    Samul Dada, PT MS Acute Rehab Dept. Number: [facility] [id number] and [facility] [phone]

## 2019-02-11 NOTE — Progress Notes (Signed)
Occupational Therapy Evaluation Patient Details Name: Kim MarseillesMable Corrigan MRN: [id number] DOB: 06-17-1929 Today's Date: 02/11/2019    History of Present Illness 85 year old woman with medical problems including elevated cardiac risk on a statin and aspirin, non-insulin-dependent diabetes, history of DVT no longer on anticoagulation, chronic anxiety on chronic benzodiazepine therapy, chronic musculoskeletal pain presenting with mechanical fall   Clinical Impression   PTA, pt lived alone independently with her dog Tigger using a cane for mobility. Her daughter assists for IADL tasks. Pt states she is feeling "much better" since getting her Klonopin. No overt LOB during ADL session. If plan is to DC home, she will need initial 24/7 S. If this is not available, [facility] If pt DC home, she will need HHOT and HHRN . Will follow acutley to further assess appropriate DC plan. Would benefit from assessing cognition with "Pill Box Test".  Encourage ambulation with staff.     Follow Up Recommendations  Supervision/Assistance - 24 hour;Home health OT(vs SNF pending progress)    Equipment Recommendations  3 in 1 bedside commode    Recommendations for Other Services       Precautions / Restrictions Precautions Precautions: Fall(pain on L hip and thigh) Precaution Comments: HOH Restrictions Weight Bearing Restrictions: No      Mobility Bed Mobility Overal bed mobility: Modified Independent                Transfers Overall transfer level: Needs assistance Equipment used: Rolling walker (2 wheeled);1 person hand held assist Transfers: Sit to/from Stand Sit to Stand: Supervision              Balance Overall balance assessment: History of Falls("had my hands full and fell forward")   Sitting balance-Leahy Scale: Good       Standing balance-Leahy Scale: Fair                             ADL either performed or assessed with clinical judgement   ADL  Overall ADL's : Needs assistance/impaired     Grooming: Set up;Supervision/safety;Standing   Upper Body Bathing: Supervision/ safety;Set up;Sitting   Lower Body Bathing: Supervison/ safety;Set up;Sit to/from stand   Upper Body Dressing : Set up;Supervision/safety;Sitting   Lower Body Dressing: Supervision/safety;Set up;Sit to/from stand   Toilet Transfer: Supervision/safety;Ambulation   Toileting- Clothing Manipulation and Hygiene: Supervision/safety;Sit to/from stand       Functional mobility during ADLs: Supervision/safety;Rolling walker       Vision         Perception     Praxis      Pertinent Vitals/Pain Pain Assessment: Faces Faces Pain Scale: Hurts little more Pain Location: L hip with mobility after falling Pain Descriptors / Indicators: Tender;Sore Pain Intervention(s): Limited activity within patient's tolerance     Hand Dominance Right   Extremity/Trunk Assessment Upper Extremity Assessment Upper Extremity Assessment: Generalized weakness   Lower Extremity Assessment Lower Extremity Assessment: Defer to PT evaluation LLE Deficits / Details: painful LLE and is minimizing wb on that leg   Cervical / Trunk Assessment Cervical / Trunk Assessment: Kyphotic   Communication Communication Communication: HOH   Cognition Arousal/Alertness: Awake/alert Behavior During Therapy: WFL for tasks assessed/performed Overall Cognitive Status: No family/caregiver present to determine baseline cognitive functioning                                 General  Comments: Will further assess; staes she has difficulty keeping up with her medication becuase she "has so many"   General Comments       Exercises Exercises: (leg strength is 3+ to 4- LLE and 4+ to RLE)   Shoulder Instructions      Home Living Family/patient expects to be discharged to:: Private residence Living Arrangements: Alone Available Help at Discharge: Family;Available  PRN/intermittently Type of Home: Apartment Home Access: Level entry     Home Layout: One level         Bathroom Toilet: Standard Bathroom Accessibility: Yes How Accessible: Accessible via walker Home Equipment: Walker - 2 wheels;Shower seat;Cane - single point          Prior Functioning/Environment Level of Independence: Independent with assistive device(s)        Comments: 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, maintenance; daughter (lives in [place] with IADL tasks        OT Problem List: Decreased strength;Decreased activity tolerance;Impaired balance (sitting and/or standing);Decreased safety awareness      OT Treatment/Interventions: Self-care/ADL training;DME and/or AE instruction;Therapeutic activities;Patient/family education    OT Goals(Current goals can be found in the care plan section) Acute Rehab OT Goals Patient Stated Goal: to go home to her dog Tigger OT Goal Formulation: With patient Time For Goal Achievement: 02/25/19 Potential to Achieve Goals: Good  OT Frequency: Min 2X/week   Barriers to D/C:            Co-evaluation              AM-PAC OT "6 Clicks" Daily Activity     Outcome Measure Help from another person eating meals?: None Help from another person taking care of personal grooming?: None Help from another person toileting, which includes using toliet, bedpan, or urinal?: A Little Help from another person bathing (including washing, rinsing, drying)?: A Little Help from another person to put on and taking off regular upper body clothing?: None Help from another person to put on and taking off regular lower body clothing?: A Little 6 Click Score: 21   End of Session Equipment Utilized During Treatment: Gait belt;Rolling walker Nurse Communication: Mobility status  Activity Tolerance: Patient tolerated treatment well Patient left: in bed;with call bell/phone within reach;with bed alarm set  OT Visit Diagnosis: Unsteadiness on feet  (R26.81);History of falling (Z91.81);Muscle weakness (generalized) (M62.81)                Time: 2549-8264 OT Time Calculation (min): 19 min Charges:  OT General Charges $OT Visit: 1 Visit OT Evaluation $OT Eval Moderate Complexity: 1 Mod  Williams Dietrick, OT/L   Acute OT Clinical Specialist Acute Rehabilitation Services Pager [phone] Office [phone]   [facility] 02/11/2019, 4:15 PM

## 2019-02-11 NOTE — Progress Notes (Signed)
Progress Note    Kim Nichols  MVH:[id number] DOB: 1929-10-28  DOA: 02/10/2019 PCP: [name], [name], MD    Brief Narrative:     Medical records reviewed and are as summarized below:  Kim Nichols is an 85 y.o. female with medical problems including elevated cardiac risk on a statin and aspirin, non-insulin-dependent diabetes, history of DVT no longer on anticoagulation, chronic anxiety on chronic benzodiazepine therapy, chronic musculoskeletal pain presenting with mechanical fall.  Assessment/Plan:   Active Problems:   Fever   Fall at home, initial encounter  fever Course: presented with mechanical fall, found to have fever to 101.32F without localizing signs or symptoms -x ray and U/A clear, no neck stiffness -pro calcitonin negative -no WBC count -patient may need repeat x ray in case she has a developing PNA -NP swab negative -blood cultures pending -continue to hold on abx unless a source becomes clear  Mechanical fall: suspect multifactorial, aging, chronic MSK pain, frailty, PT/OT requested-- SNF, at baseline patient living alone but needing assistance with IADLs; medications (benzo, opioids, gabapentin - may be playing contributing factors as well)  HTN: will continue home agents given SBP > 140 on admission, continue clonidine, ACEi, CCB  Anxiety: on chronic benzodiazepines, 1 mg of clonazepam QHS and 0.5 mg in AM - continue for now; ? Need to wean off   Family Communication/Anticipated D/C date and plan/Code Status   DVT prophylaxis: Lovenox ordered. Code Status: dnr Family Communication:  Disposition Plan: will need SNF   Medical Consultants:    None.     Subjective:   Has a headache and knee pain where she hit her knee and head when she fell  Objective:    Vitals:   02/10/19 2018 02/11/19 0024 02/11/19 0427 02/11/19 1124  BP:  117/60 99/61 (!) 143/85  Pulse:  65 (!) 56 86  Resp:  16 20 20   Temp: (!) 101.4 F (38.6 C) 99.4 F (37.4 C)  98.5 F (36.9 C) 99 F (37.2 C)  TempSrc: Oral Oral Oral Oral  SpO2:  94% 95% 96%  Weight:      Height:        Intake/Output Summary (Last 24 hours) at 02/11/2019 1612 Last data filed at 02/11/2019 0300 Gross per 24 hour  Intake 296.18 ml  Output -  Net 296.18 ml   Filed Weights   02/10/19 1614  Weight: 56.7 kg    Exam: In chair, frail appearing rrr No increased work of breathing +BS, soft Large bruise on left arm No LE edema   Data Reviewed:   I have personally reviewed following labs and imaging studies:  Labs: Labs show the following:   Basic Metabolic Panel: Recent Labs  Lab 02/10/19 2020 02/11/19 0015  NA 138 136  K 4.6 4.2  CL 103 101  CO2 26 25  GLUCOSE 207* 138*  BUN 21 21  CREATININE 0.96 0.97  CALCIUM 9.2 9.0   GFR Estimated Creatinine Clearance: 36.6 mL/min (by C-G formula based on SCr of 0.97 mg/dL). Liver Function Tests: Recent Labs  Lab 02/10/19 2020 02/11/19 0015  AST 30 32  ALT 22 20  ALKPHOS 55 51  BILITOT 0.4 0.6  PROT 6.9 6.7  ALBUMIN 3.7 3.5   No results for input(s): LIPASE, AMYLASE in the last 168 hours. No results for input(s): AMMONIA in the last 168 hours. Coagulation profile No results for input(s): INR, PROTIME in the last 168 hours.  CBC: Recent Labs  Lab 02/10/19 2020 02/11/19 0015  WBC 9.0 8.3  NEUTROABS 8.2* 6.9  HGB 11.7* 11.0*  HCT 37.5 34.9*  MCV 91.5 90.9  PLT 209 221   Cardiac Enzymes: No results for input(s): CKTOTAL, CKMB, CKMBINDEX, TROPONINI in the last 168 hours. BNP (last 3 results) No results for input(s): PROBNP in the last 8760 hours. CBG: Recent Labs  Lab 02/11/19 0753  GLUCAP 99   D-Dimer: No results for input(s): DDIMER in the last 72 hours. Hgb A1c: No results for input(s): HGBA1C in the last 72 hours. Lipid Profile: No results for input(s): CHOL, HDL, LDLCALC, TRIG, CHOLHDL, LDLDIRECT in the last 72 hours. Thyroid function studies: No results for input(s): TSH, T4TOTAL,  T3FREE, THYROIDAB in the last 72 hours.  Invalid input(s): FREET3 Anemia work up: No results for input(s): VITAMINB12, FOLATE, FERRITIN, TIBC, IRON, RETICCTPCT in the last 72 hours. Sepsis Labs: Recent Labs  Lab 02/10/19 2020 02/11/19 0015  PROCALCITON  --  <0.10  WBC 9.0 8.3    Microbiology Recent Results (from the past 240 hour(s))  Respiratory Panel by PCR     Status: None   Collection Time: 02/10/19 10:37 PM  Result Value Ref Range Status   Adenovirus NOT DETECTED NOT DETECTED Final   Coronavirus 229E NOT DETECTED NOT DETECTED Final    Comment: (NOTE) The Coronavirus on the Respiratory Panel, DOES NOT test for the novel  Coronavirus (2019 nCoV)    Coronavirus HKU1 NOT DETECTED NOT DETECTED Final   Coronavirus NL63 NOT DETECTED NOT DETECTED Final   Coronavirus OC43 NOT DETECTED NOT DETECTED Final   Metapneumovirus NOT DETECTED NOT DETECTED Final   Rhinovirus / Enterovirus NOT DETECTED NOT DETECTED Final   Influenza A NOT DETECTED NOT DETECTED Final   Influenza B NOT DETECTED NOT DETECTED Final   Parainfluenza Virus 1 NOT DETECTED NOT DETECTED Final   Parainfluenza Virus 2 NOT DETECTED NOT DETECTED Final   Parainfluenza Virus 3 NOT DETECTED NOT DETECTED Final   Parainfluenza Virus 4 NOT DETECTED NOT DETECTED Final   Respiratory Syncytial Virus NOT DETECTED NOT DETECTED Final   Bordetella pertussis NOT DETECTED NOT DETECTED Final   Chlamydophila pneumoniae NOT DETECTED NOT DETECTED Final   Mycoplasma pneumoniae NOT DETECTED NOT DETECTED Final    Comment: Performed at [facility], [address], [place], [place] [address]    Procedures and diagnostic studies:  Dg Chest 2 View  Result Date: 02/10/2019 CLINICAL DATA:  Fever, chest pain EXAM: CHEST - 2 VIEW COMPARISON:  11/27/2016 FINDINGS: Heart and mediastinal contours are within normal limits. No focal opacities or effusions. No acute bony abnormality. IMPRESSION: No active cardiopulmonary disease.  Electronically Signed   By: Charlett Nose M.D.   On: 02/10/2019 21:17   Dg Shoulder Right  Result Date: 02/10/2019 CLINICAL DATA:  85 year old female with a history of fall and shoulder pain EXAM: RIGHT SHOULDER - 2+ VIEW COMPARISON:  None. FINDINGS: Osteopenia. No acute displaced fracture of the humerus, scapula, or the visualized clavicle. Degenerative changes of the acromioclavicular joint. Glenohumeral joint appears congruent. Unremarkable scapular Y-view. No focal soft tissue swelling. IMPRESSION: Negative for acute bony abnormality. Degenerative changes of the acromioclavicular joint. Electronically Signed   By: Gilmer Mor D.O.   On: 02/10/2019 18:46   Ct Head Wo Contrast  Result Date: 02/10/2019 CLINICAL DATA:  Fall EXAM: CT HEAD WITHOUT CONTRAST CT CERVICAL SPINE WITHOUT CONTRAST TECHNIQUE: Multidetector CT imaging of the head and cervical spine was performed following the standard protocol without intravenous contrast. Multiplanar CT image reconstructions  of the cervical spine were also generated. COMPARISON:  07/13/2017 FINDINGS: CT HEAD FINDINGS Brain: No evidence of acute infarction, hemorrhage, hydrocephalus, extra-axial collection or mass lesion/mass effect. Periventricular white matter hypodensity. Vascular: No hyperdense vessel or unexpected calcification. Skull: Normal. Negative for fracture or focal lesion. Sinuses/Orbits: No acute finding. Other: None. CT CERVICAL SPINE FINDINGS Alignment: Normal. Skull base and vertebrae: No acute fracture. No primary bone lesion or focal pathologic process. 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 Soft tissues and spinal canal: No prevertebral fluid or swelling. No visible canal hematoma. Disc levels: Moderate multilevel disc and facet degenerative disease of the cervical spine. Upper chest: Negative. Other: None. IMPRESSION: 1.  No acute intracranial pathology. 2.  Small-vessel white matter disease. 3.  No fracture or static subluxation  of the cervical spine. 4.  Moderate multilevel disc and facet degenerative disease. Electronically Signed   By: Lauralyn Primes M.D.   On: 02/10/2019 16:13   Ct Cervical Spine Wo Contrast  Result Date: 02/10/2019 CLINICAL DATA:  Fall EXAM: CT HEAD WITHOUT CONTRAST CT CERVICAL SPINE WITHOUT CONTRAST TECHNIQUE: Multidetector CT imaging of the head and cervical spine was performed following the standard protocol without intravenous contrast. Multiplanar CT image reconstructions of the cervical spine were also generated. COMPARISON:  07/13/2017 FINDINGS: CT HEAD FINDINGS Brain: No evidence of acute infarction, hemorrhage, hydrocephalus, extra-axial collection or mass lesion/mass effect. Periventricular white matter hypodensity. Vascular: No hyperdense vessel or unexpected calcification. Skull: Normal. Negative for fracture or focal lesion. Sinuses/Orbits: No acute finding. Other: None. CT CERVICAL SPINE FINDINGS Alignment: Normal. Skull base and vertebrae: No acute fracture. No primary bone lesion or focal pathologic process. 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 Soft tissues and spinal canal: No prevertebral fluid or swelling. No visible canal hematoma. Disc levels: Moderate multilevel disc and facet degenerative disease of the cervical spine. Upper chest: Negative. Other: None. IMPRESSION: 1.  No acute intracranial pathology. 2.  Small-vessel white matter disease. 3.  No fracture or static subluxation of the cervical spine. 4.  Moderate multilevel disc and facet degenerative disease. Electronically Signed   By: Lauralyn Primes M.D.   On: 02/10/2019 16:13    Medications:   . aspirin EC  81 mg Oral Daily  . clonazePAM  0.5-1 mg Oral BID  . cloNIDine  0.1 mg Oral QHS  . enalapril  20 mg Oral Daily  . enoxaparin (LOVENOX) injection  40 mg Subcutaneous Q24H  . felodipine  5 mg Oral Daily  . gabapentin  100 mg Oral TID  . multivitamin  1 tablet Oral Daily  . simvastatin  40 mg Oral QHS  .  sodium chloride flush  3 mL Intravenous Q12H   Continuous Infusions:   LOS: 0 days   Joseph Art  [facility]   How to contact the [facility] Attending or Consulting provider 7A - 7P or covering provider during after hours 7P -7A, for this patient?  1. Check the care team in [facility] and look for a) attending/consulting [facility] provider listed and b) the [facility] team listed 2. Log into [email or web address] and use [facility] universal password to access. If you do not have the password, please contact the hospital operator. 3. Locate the [facility] provider you are looking for under [facility] and page to a number that you can be directly reached. 4. If you still have difficulty reaching the provider, please page the [facility] (Director on Call) for the Hospitalists listed on amion for assistance.  02/11/2019, 4:12  PM

## 2019-02-12 DIAGNOSIS — S0990XA Unspecified injury of head, initial encounter: Secondary | ICD-10-CM | POA: Diagnosis not present

## 2019-02-12 DIAGNOSIS — R509 Fever, unspecified: Secondary | ICD-10-CM | POA: Diagnosis not present

## 2019-02-12 LAB — CBC
HCT: 34.1 % — ABNORMAL LOW (ref 36.0–46.0)
Hemoglobin: 10.4 g/dL — ABNORMAL LOW (ref 12.0–15.0)
MCH: 28.1 pg (ref 26.0–34.0)
MCHC: 30.5 g/dL (ref 30.0–36.0)
MCV: 92.2 fL (ref 80.0–100.0)
Platelets: 183 10*3/uL (ref 150–400)
RBC: 3.7 MIL/uL — ABNORMAL LOW (ref 3.87–5.11)
RDW: 12.8 % (ref 11.5–15.5)
WBC: 5 10*3/uL (ref 4.0–10.5)
nRBC: 0 % (ref 0.0–0.2)

## 2019-02-12 LAB — BASIC METABOLIC PANEL
Anion gap: 6 (ref 5–15)
BUN: 18 mg/dL (ref 8–23)
CALCIUM: 8.5 mg/dL — AB (ref 8.9–10.3)
CO2: 26 mmol/L (ref 22–32)
Chloride: 108 mmol/L (ref 98–111)
Creatinine, Ser: 0.99 mg/dL (ref 0.44–1.00)
GFR calc Af Amer: 59 mL/min — ABNORMAL LOW (ref >60)
GFR calc non Af Amer: 51 mL/min — ABNORMAL LOW (ref >60)
Glucose, Bld: 137 mg/dL — ABNORMAL HIGH (ref 70–99)
Potassium: 4.1 mmol/L (ref 3.5–5.1)
Sodium: 140 mmol/L (ref 135–145)

## 2019-02-12 LAB — PROCALCITONIN: Procalcitonin: <0.1 ng/mL

## 2019-02-12 LAB — VITAMIN B12: Vitamin B-12: 241 pg/mL (ref 180–914)

## 2019-02-12 MED ORDER — VITAMIN B-12 1000 MCG PO TABS
500.0000 ug | ORAL_TABLET | Freq: Every day | ORAL | Status: DC
  Administered 2019-02-12 – 2019-02-13 (×2): 500 ug via ORAL
  Filled 2019-02-12 (×2): qty 1

## 2019-02-12 NOTE — Progress Notes (Signed)
Progress Note    Kim Nichols  NUU:[id number] DOB: 1929/02/26  DOA: 02/10/2019 PCP: Kim Housekeeper, MD    Brief Narrative:     Medical records reviewed and are as summarized below:  Kim Nichols is an 85 y.o. female with medical problems including elevated cardiac risk on a statin and aspirin, non-insulin-dependent diabetes, history of DVT no longer on anticoagulation, chronic anxiety on chronic benzodiazepine therapy, chronic musculoskeletal pain presenting with mechanical fall.  Assessment/Plan:   Active Problems:   Fever   Fall at home, initial encounter  fever Course: presented with mechanical fall, found to have fever to 101.63F without localizing signs or symptoms-- resolved -x ray and U/A clear, no neck stiffness -pro calcitonin negative -no WBC count -NP swab negative -blood cultures NGTD -continue to hold on abx unless a source becomes clear  Mechanical fall: suspect multifactorial, aging, chronic MSK pain, frailty, PT/OT requested-- SNF, at baseline patient living alone but needing assistance with IADLs; medications (benzo, opioids, gabapentin - may be playing contributing factors as well)  HTN: will continue home agents given SBP > 140 on admission, continue clonidine, ACEi, CCB  Anxiety: on chronic benzodiazepines, 1 mg of clonazepam QHS and 0.5 mg in AM - continue for now; ? Need to wean off  Plan for rehab, after rehab patient is considering going to live with daughter Kim Nichols in [place].   Family Communication/Anticipated D/C date and plan/Code Status   DVT prophylaxis: Lovenox ordered. Code Status: dnr Family Communication: spoke with daughter Kim Nichols Disposition Plan: will need SNF   Medical Consultants:    None.     Subjective:   Knee still sore -worried about her dog at home- "Tigger" -had thoughts of moving with her daughter in [place] but is worried about her house  Objective:    Vitals:   02/11/19 1124 02/11/19 1656 02/11/19 2300  02/12/19 0506  BP: (!) 143/85 (!) 113/94 (!) 149/92 (!) 161/77  Pulse: 86 64 61 (!) 58  Resp: 20 18 16 18   Temp: 99 F (37.2 C) 98.8 F (37.1 C) 97.9 F (36.6 C) 98.7 F (37.1 C)  TempSrc: Oral Oral Oral Oral  SpO2: 96% 100% 95% 95%  Weight:      Height:        Intake/Output Summary (Last 24 hours) at 02/12/2019 1150 Last data filed at 02/11/2019 1300 Gross per 24 hour  Intake 120 ml  Output -  Net 120 ml   Filed Weights   02/10/19 1614  Weight: 56.7 kg    Exam: In chair, NAD rrr No wheezing, no increased work of breathing Moves all 4 lower ext Bruising on left knee   Data Reviewed:   I have personally reviewed following labs and imaging studies:  Labs: Labs show the following:   Basic Metabolic Panel: Recent Labs  Lab 02/10/19 2020 02/11/19 0015 02/12/19 0444  NA 138 136 140  K 4.6 4.2 4.1  CL 103 101 108  CO2 26 25 26   GLUCOSE 207* 138* 137*  BUN 21 21 18   CREATININE 0.96 0.97 0.99  CALCIUM 9.2 9.0 8.5*   GFR Estimated Creatinine Clearance: 35.8 mL/min (by C-G formula based on SCr of 0.99 mg/dL). Liver Function Tests: Recent Labs  Lab 02/10/19 2020 02/11/19 0015  AST 30 32  ALT 22 20  ALKPHOS 55 51  BILITOT 0.4 0.6  PROT 6.9 6.7  ALBUMIN 3.7 3.5   No results for input(s): LIPASE, AMYLASE in the last 168 hours. No results for input(s):  AMMONIA in the last 168 hours. Coagulation profile No results for input(s): INR, PROTIME in the last 168 hours.  CBC: Recent Labs  Lab 02/10/19 2020 02/11/19 0015 02/12/19 0444  WBC 9.0 8.3 5.0  NEUTROABS 8.2* 6.9  --   HGB 11.7* 11.0* 10.4*  HCT 37.5 34.9* 34.1*  MCV 91.5 90.9 92.2  PLT 209 221 183   Cardiac Enzymes: No results for input(s): CKTOTAL, CKMB, CKMBINDEX, TROPONINI in the last 168 hours. BNP (last 3 results) No results for input(s): PROBNP in the last 8760 hours. CBG: Recent Labs  Lab 02/11/19 0753  GLUCAP 99   D-Dimer: No results for input(s): DDIMER in the last 72  hours. Hgb A1c: No results for input(s): HGBA1C in the last 72 hours. Lipid Profile: No results for input(s): CHOL, HDL, LDLCALC, TRIG, CHOLHDL, LDLDIRECT in the last 72 hours. Thyroid function studies: No results for input(s): TSH, T4TOTAL, T3FREE, THYROIDAB in the last 72 hours.  Invalid input(s): FREET3 Anemia work up: Recent Labs    02/12/19 0444  VITAMINB12 241   Sepsis Labs: Recent Labs  Lab 02/10/19 2020 02/11/19 0015 02/12/19 0444  PROCALCITON  --  <0.10 <0.10  WBC 9.0 8.3 5.0    Microbiology Recent Results (from the past 240 hour(s))  Respiratory Panel by PCR     Status: None   Collection Time: 02/10/19 10:37 PM  Result Value Ref Range Status   Adenovirus NOT DETECTED NOT DETECTED Final   Coronavirus 229E NOT DETECTED NOT DETECTED Final    Comment: (NOTE) The Coronavirus on the Respiratory Panel, DOES NOT test for the novel  Coronavirus (2019 nCoV)    Coronavirus HKU1 NOT DETECTED NOT DETECTED Final   Coronavirus NL63 NOT DETECTED NOT DETECTED Final   Coronavirus OC43 NOT DETECTED NOT DETECTED Final   Metapneumovirus NOT DETECTED NOT DETECTED Final   Rhinovirus / Enterovirus NOT DETECTED NOT DETECTED Final   Influenza A NOT DETECTED NOT DETECTED Final   Influenza B NOT DETECTED NOT DETECTED Final   Parainfluenza Virus 1 NOT DETECTED NOT DETECTED Final   Parainfluenza Virus 2 NOT DETECTED NOT DETECTED Final   Parainfluenza Virus 3 NOT DETECTED NOT DETECTED Final   Parainfluenza Virus 4 NOT DETECTED NOT DETECTED Final   Respiratory Syncytial Virus NOT DETECTED NOT DETECTED Final   Bordetella pertussis NOT DETECTED NOT DETECTED Final   Chlamydophila pneumoniae NOT DETECTED NOT DETECTED Final   Mycoplasma pneumoniae NOT DETECTED NOT DETECTED Final    Comment: Performed at [facility], [address], [place], [place] [address]  Culture, blood (routine x 2)     Status: None (Preliminary result)   Collection Time: 02/11/19 12:12 AM  Result Value Ref  Range Status   Specimen Description BLOOD RIGHT ARM  Final   Special Requests   Final    BOTTLES DRAWN AEROBIC AND ANAEROBIC Blood Culture adequate volume   Culture   Final    NO GROWTH 1 DAY Performed at [facility], [address], [place], [place] [address]    Report Status PENDING  Incomplete  Culture, blood (routine x 2)     Status: None (Preliminary result)   Collection Time: 02/11/19 12:15 AM  Result Value Ref Range Status   Specimen Description BLOOD RIGHT HAND  Final   Special Requests   Final    BOTTLES DRAWN AEROBIC AND ANAEROBIC Blood Culture adequate volume   Culture   Final    NO GROWTH 1 DAY Performed at [facility]  [facility], [address] [place], [place] [address]    Report Status PENDING  Incomplete    Procedures and diagnostic studies:  Dg Chest 2 View  Result Date: 02/10/2019 CLINICAL DATA:  Fever, chest pain EXAM: CHEST - 2 VIEW COMPARISON:  11/27/2016 FINDINGS: Heart and mediastinal contours are within normal limits. No focal opacities or effusions. No acute bony abnormality. IMPRESSION: No active cardiopulmonary disease. Electronically Signed   By: Charlett NoseKevin  Dover M.D.   On: 02/10/2019 21:17   Dg Shoulder Right  Result Date: 02/10/2019 CLINICAL DATA:  85 year old female with a history of fall and shoulder pain EXAM: RIGHT SHOULDER - 2+ VIEW COMPARISON:  None. FINDINGS: Osteopenia. No acute displaced fracture of the humerus, scapula, or the visualized clavicle. Degenerative changes of the acromioclavicular joint. Glenohumeral joint appears congruent. Unremarkable scapular Y-view. No focal soft tissue swelling. IMPRESSION: Negative for acute bony abnormality. Degenerative changes of the acromioclavicular joint. Electronically Signed   By: Gilmer MorJaime  Wagner D.O.   On: 02/10/2019 18:46   Ct Head Wo Contrast  Result Date: 02/10/2019 CLINICAL DATA:  Fall EXAM: CT HEAD WITHOUT CONTRAST CT CERVICAL SPINE WITHOUT CONTRAST TECHNIQUE: Multidetector CT imaging of the head  and cervical spine was performed following the standard protocol without intravenous contrast. Multiplanar CT image reconstructions of the cervical spine were also generated. COMPARISON:  07/13/2017 FINDINGS: CT HEAD FINDINGS Brain: No evidence of acute infarction, hemorrhage, hydrocephalus, extra-axial collection or mass lesion/mass effect. Periventricular white matter hypodensity. Vascular: No hyperdense vessel or unexpected calcification. Skull: Normal. Negative for fracture or focal lesion. Sinuses/Orbits: No acute finding. Other: None. CT CERVICAL SPINE FINDINGS Alignment: Normal. Skull base and vertebrae: No acute fracture. No primary bone lesion or focal pathologic process. 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 Soft tissues and spinal canal: No prevertebral fluid or swelling. No visible canal hematoma. Disc levels: Moderate multilevel disc and facet degenerative disease of the cervical spine. Upper chest: Negative. Other: None. IMPRESSION: 1.  No acute intracranial pathology. 2.  Small-vessel white matter disease. 3.  No fracture or static subluxation of the cervical spine. 4.  Moderate multilevel disc and facet degenerative disease. Electronically Signed   By: Lauralyn PrimesAlex  Bibbey M.D.   On: 02/10/2019 16:13   Ct Cervical Spine Wo Contrast  Result Date: 02/10/2019 CLINICAL DATA:  Fall EXAM: CT HEAD WITHOUT CONTRAST CT CERVICAL SPINE WITHOUT CONTRAST TECHNIQUE: Multidetector CT imaging of the head and cervical spine was performed following the standard protocol without intravenous contrast. Multiplanar CT image reconstructions of the cervical spine were also generated. COMPARISON:  07/13/2017 FINDINGS: CT HEAD FINDINGS Brain: No evidence of acute infarction, hemorrhage, hydrocephalus, extra-axial collection or mass lesion/mass effect. Periventricular white matter hypodensity. Vascular: No hyperdense vessel or unexpected calcification. Skull: Normal. Negative for fracture or focal lesion.  Sinuses/Orbits: No acute finding. Other: None. CT CERVICAL SPINE FINDINGS Alignment: Normal. Skull base and vertebrae: No acute fracture. No primary bone lesion or focal pathologic process. 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 Soft tissues and spinal canal: No prevertebral fluid or swelling. No visible canal hematoma. Disc levels: Moderate multilevel disc and facet degenerative disease of the cervical spine. Upper chest: Negative. Other: None. IMPRESSION: 1.  No acute intracranial pathology. 2.  Small-vessel white matter disease. 3.  No fracture or static subluxation of the cervical spine. 4.  Moderate multilevel disc and facet degenerative disease. Electronically Signed   By: Lauralyn PrimesAlex  Bibbey M.D.   On: 02/10/2019 16:13    Medications:   . aspirin EC  81 mg Oral Daily  . clonazePAM  0.5-1 mg Oral BID  . cloNIDine  0.1 mg Oral QHS  . enalapril  20 mg Oral Daily  . enoxaparin (LOVENOX) injection  40 mg Subcutaneous Q24H  . felodipine  5 mg Oral Daily  . gabapentin  100 mg Oral TID  . multivitamin  1 tablet Oral Daily  . simvastatin  40 mg Oral QHS  . sodium chloride flush  3 mL Intravenous Q12H  . vitamin B-12  500 mcg Oral Daily   Continuous Infusions:   LOS: 0 days   Joseph Art  [facility]   How to contact the [facility] Attending or Consulting provider 7A - 7P or covering provider during after hours 7P -7A, for this patient?  1. Check the care team in [facility] and look for a) attending/consulting [facility] provider listed and b) the [facility] team listed 2. Log into [email or web address] and use [facility]'s universal password to access. If you do not have the password, please contact the hospital operator. 3. Locate the [facility] provider you are looking for under [facility] and page to a number that you can be directly reached. 4. If you still have difficulty reaching the provider, please page the [facility] (Director on Call) for the Hospitalists listed on amion for assistance.  02/12/2019,  11:50 AM

## 2019-02-12 NOTE — Care Management Obs Status (Signed)
MEDICARE OBSERVATION STATUS NOTIFICATION   Patient Details  Name: Kim Nichols MRN: [id number] Date of Birth: 07-10-1929   Medicare Observation Status Notification Given:  Yes    Colleen Can RN, BSN, NCM-BC, ACM-RN [phone] 02/12/2019, 10:07 AM

## 2019-02-12 NOTE — Progress Notes (Signed)
CSW lvm with daughter Wille Celeste and notes previous attempts from CSW yesterday.   CSW called patient's second daughter Gavin Pound who requests an email of skilled nursing facilities for her to choose from.   CSW sent email and explained choice is needed in order to start insurance authorization.   [place], [place] [phone]

## 2019-02-13 DIAGNOSIS — S0990XA Unspecified injury of head, initial encounter: Secondary | ICD-10-CM | POA: Diagnosis not present

## 2019-02-13 DIAGNOSIS — R509 Fever, unspecified: Secondary | ICD-10-CM | POA: Diagnosis not present

## 2019-02-13 LAB — GLUCOSE, CAPILLARY: GLUCOSE-CAPILLARY: 120 mg/dL — AB (ref 70–99)

## 2019-02-13 MED ORDER — FERROUS SULFATE 325 (65 FE) MG PO TABS: 325.0000 mg | ORAL_TABLET | Freq: Every day | ORAL | Status: AC

## 2019-02-13 MED ORDER — CYANOCOBALAMIN 500 MCG PO TABS: 500.0000 ug | ORAL_TABLET | Freq: Every day | ORAL | 0 refills | Status: AC

## 2019-02-13 MED ORDER — GABAPENTIN 100 MG PO CAPS: 100.0000 mg | ORAL_CAPSULE | Freq: Three times a day (TID) | ORAL | 0 refills | Status: AC

## 2019-02-13 MED ORDER — ACETAMINOPHEN 325 MG PO TABS: 650.0000 mg | ORAL_TABLET | Freq: Four times a day (QID) | ORAL | Status: AC | PRN
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
## 2019-02-13 NOTE — Progress Notes (Signed)
Pt discharge education completed at bedside. Pt verbalizes understanding of which medications to stop,to use walker at all times and which pharmacy to pick up medications. Pt states she has a follow up apt with her primary MD the first week of March, pt states she will call to see if it can be moved up. Pt has all belongings. Pt IV discontinued, catheter intact. Pt daughter Kim Nichols arrived to pick up pt. Pt disahrged via wheelchair with NT

## 2019-02-13 NOTE — Progress Notes (Signed)
Spoke to physical therapist and they stated pt will be seen after lunch  Pt aware

## 2019-02-13 NOTE — Progress Notes (Signed)
Consulted at bedside with MD, RN, pt and pt daughter Vela Prose on plan of care, home health care and friends to help out due to pt refusing long term care  Pt daughter understanding  Called pt other daughter Aura Camps for update and for transportation  Pt daughter Aura Camps stated "I will not come pick her up" Educated [place] on plan of care, Aura Camps was not receptive  Urged her to call other daughter Stanton Kidney and pt Aura Camps refused to speak to pt

## 2019-02-13 NOTE — Progress Notes (Signed)
Patient is refusing SNF at this time. CSW will consult RNCM regarding home health supports. CSW will follow as needed for social work needs.  Tenna Delaine, LCSW, LCAS-A Clinical Social Worker II [phone]

## 2019-02-13 NOTE — Care Management Note (Signed)
Case Management Note  Patient Details  Name: Shahla Diazdeleon MRN: [id number] Date of Birth: 06-13-1929  Subjective/Objective:     Pt to return home with assist of family and home health services.                 Action/Plan: Pt has used [facility] in the past and would like to use again.  Referral called to [facility] with [facility] and they accepted referral with start of care on Monday.   Expected Discharge Date:  02/13/19               Expected Discharge Plan:  Home w Home Health Services  In-House Referral:  NA  Discharge planning Services  CM Consult  Post Acute Care Choice:  Home Health Choice offered to:  Patient  DME Arranged:    DME Agency:     HH Arranged:  RN, PT, OT, Social Work [employer] Agency:  [facility]  Status of Service:  Completed, signed off  If discussed at [employer] [facility] [employer], dates discussed:    Additional Comments:  Deveron Furlong, RN 02/13/2019, 4:53 PM

## 2019-02-13 NOTE — Progress Notes (Signed)
Pt daughter, Wille Celeste called requesting update from MD  Pt daughter concerned for patient altered mental status, educated pt daughter that pt has been answering questions appropriately throughout the morning. Pt daughter states pt care is too much and pt cannot go home with her. Educated daughter that per notes it looks like CSW is working pt up for a facility. Pt daughter understanding at this time  Informed MD family asking for update

## 2019-02-13 NOTE — Progress Notes (Signed)
Case manager aware of home health

## 2019-02-13 NOTE — Progress Notes (Addendum)
Physical Therapy Treatment Patient Details Name: Kim Nichols MRN: [id number] DOB: 01-06-1929 Today's Date: 02/13/2019    History of Present Illness 85 year old woman with medical problems including elevated cardiac risk on a statin and aspirin, non-insulin-dependent diabetes, history of DVT no longer on anticoagulation, chronic anxiety on chronic benzodiazepine therapy, chronic musculoskeletal pain presenting with mechanical fall    PT Comments    Pt reassessed at request of Dr. Benjamine Mola. Patient is progressing very well towards their physical therapy goals. Improved independence with functional mobility, ambulating 250 feet with walker without physical assistance. Pt demonstrates no gross dynamic imbalance with use of external support. Recommended walker for all mobility in addition to attaching bag on walker in order to avoid holding objects when walking. Educated pt on fall prevention techniques and pt verbalized understanding. Updated d/c plan to HHPT to maximize functional independence.   Follow Up Recommendations  Home health PT/OT/RN;Supervision for mobility/OOB (pt declining SNF)     Equipment Recommendations  None recommended by PT (pt has walker)   Recommendations for Other Services       Precautions / Restrictions Precautions Precautions: Fall Precaution Comments: HOH Restrictions Weight Bearing Restrictions: No    Mobility  Bed Mobility Overal bed mobility: Modified Independent                Transfers Overall transfer level: Modified independent Equipment used: Rolling walker (2 wheeled);1 person hand held assist                Ambulation/Gait Ambulation/Gait assistance: Modified independent (Device/Increase time) Gait Distance (Feet): 250 Feet Assistive device: Rolling walker (2 wheeled) Gait Pattern/deviations: Step-through pattern;Decreased stride length;Trunk flexed     General Gait Details: Pt stable with use of walker and trunk flexed  positioning throughout (baseline). Cues for walker proximity and walker height adjusted for pt comfort   Stairs             Wheelchair Mobility    Modified Rankin (Stroke Patients Only)       Balance Overall balance assessment: History of Falls("had my hands full and fell forward")   Sitting balance-Leahy Scale: Good       Standing balance-Leahy Scale: Fair                              Cognition Arousal/Alertness: Awake/alert Behavior During Therapy: WFL for tasks assessed/performed Overall Cognitive Status: Within Functional Limits for tasks assessed                                 General Comments: Answer all questions appropriately      Exercises      General Comments        Pertinent Vitals/Pain Pain Assessment: Faces Faces Pain Scale: No hurt    Home Living                      Prior Function            PT Goals (current goals can now be found in the care plan section) Acute Rehab PT Goals Patient Stated Goal: to go home to her dog Tigger Potential to Achieve Goals: Good Progress towards PT goals: Progressing toward goals    Frequency    Min 3X/week      PT Plan Discharge plan needs to be updated    Co-evaluation  AM-PAC PT "6 Clicks" Mobility   Outcome Measure  Help needed turning from your back to your side while in a flat bed without using bedrails?: None Help needed moving from lying on your back to sitting on the side of a flat bed without using bedrails?: None Help needed moving to and from a bed to a chair (including a wheelchair)?: None Help needed standing up from a chair using your arms (e.g., wheelchair or bedside chair)?: None Help needed to walk in hospital room?: None Help needed climbing 3-5 steps with a railing? : A Lot 6 Click Score: 22    End of Session Equipment Utilized During Treatment: Gait belt Activity Tolerance: Patient tolerated treatment  well Patient left: in bed;with call bell/phone within reach;with bed alarm set Nurse Communication: Mobility status PT Visit Diagnosis: Unsteadiness on feet (R26.81);Muscle weakness (generalized) (M62.81);Difficulty in walking, not elsewhere classified (R26.2);Pain Pain - Right/Left: Left Pain - part of body: Hip     Time: 7741-2878 PT Time Calculation (min) (ACUTE ONLY): 16 min  Charges:  $Gait Training: 8-22 mins                    Laurina Bustle, PT, DPT Acute Rehabilitation Services Pager [phone] Office [phone]    Vanetta Mulders 02/13/2019, 3:24 PM

## 2019-02-13 NOTE — Progress Notes (Signed)
Pt daughter, Jeronimo Norma, called yelling and crying about pt discharge  Pt daughter states "I cannot take care of her" "she needs total care, I will not sign for her to be discahrged"   Educated pt family on plan of care including re eval from physical therapy  Pt daughter is not understanding, continues to state "I will not be responsible, she cannot live by herself"  Educated on pt rights, pt has responded appropriately, MD aware of family concerns

## 2019-02-16 LAB — CULTURE, BLOOD (ROUTINE X 2)
Culture: NO GROWTH
Culture: NO GROWTH
Special Requests: ADEQUATE
Special Requests: ADEQUATE

## 2020-03-16 IMAGING — CT CT CERVICAL SPINE W/O CM
4 of 6 series · 15 of 33 positions shown, 16 images · non-contrast
Comparison: 07/13/2017

CLINICAL DATA: Fall

EXAM:
CT HEAD WITHOUT CONTRAST
CT CERVICAL SPINE WITHOUT CONTRAST
TECHNIQUE: Multidetector CT imaging of the head and cervical spine was
performed following the standard protocol without intravenous
contrast. Multiplanar CT image reconstructions of the cervical spine
were also generated.

[Series 5: head bone · axial · 0.41mm/px · z∈[-117,-21]mm · 4 of 82 slices shown]
[im 17/82  bone]
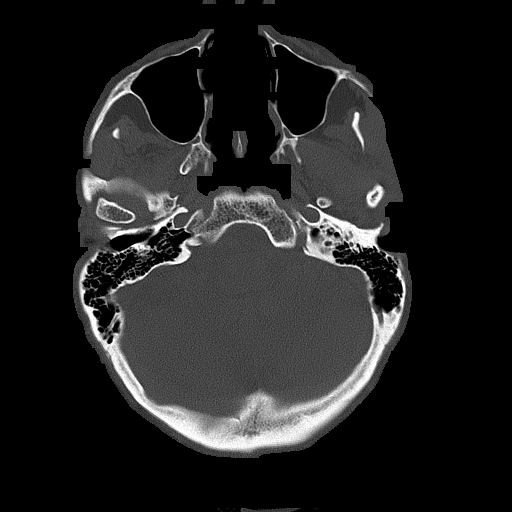
[im 33/82  bone]
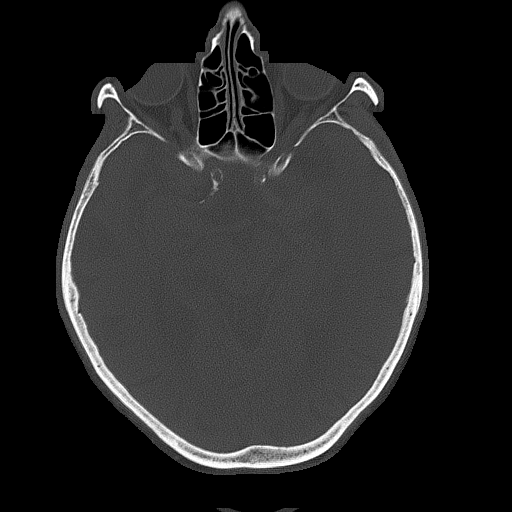
[im 49/82  bone]
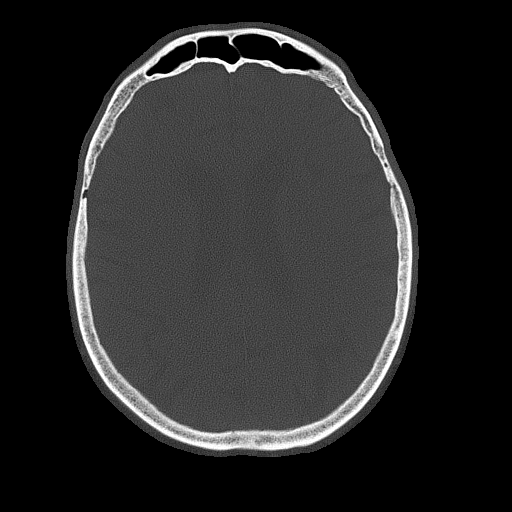
[im 65/82  bone]
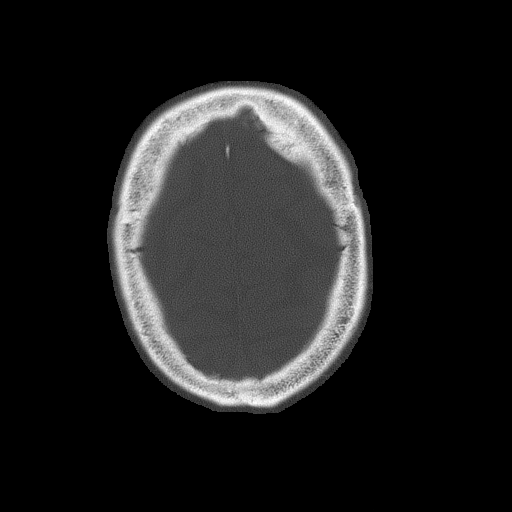

[Series 6: head without cor · coronal · non-contrast · 0.35mm/px · 3 of 71 slices shown]
[im 15/71  bone]
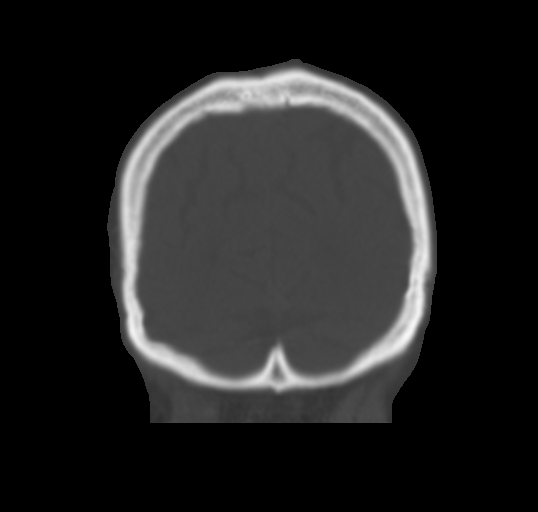
[im 29/71  bone]
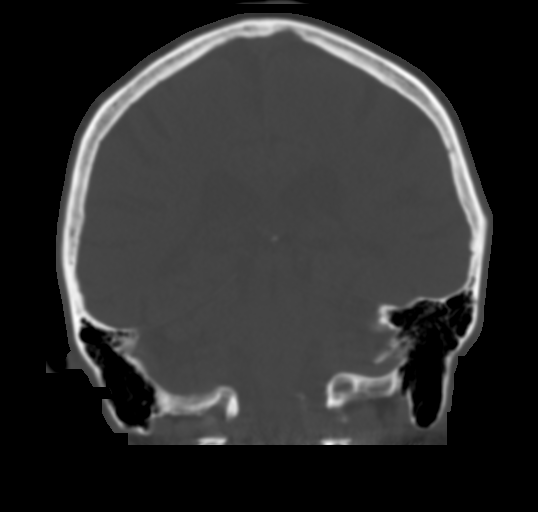
[im 43/71  bone]
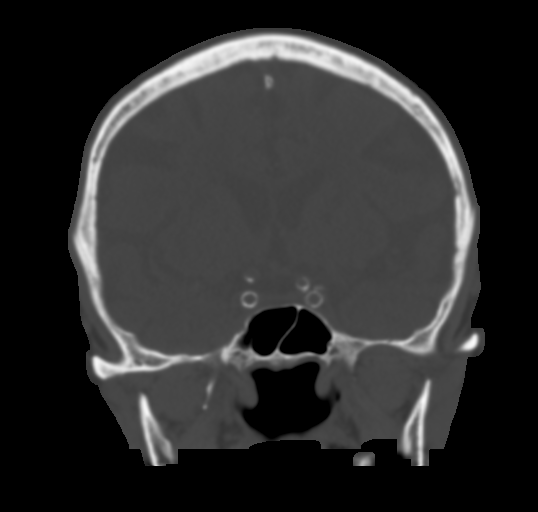

[Series 8: c_spine 2.0 st · axial · 0.42mm/px · z∈[-245,-151]mm · 4 of 79 slices shown, 5 images]
[im 16/79  soft-tissue]
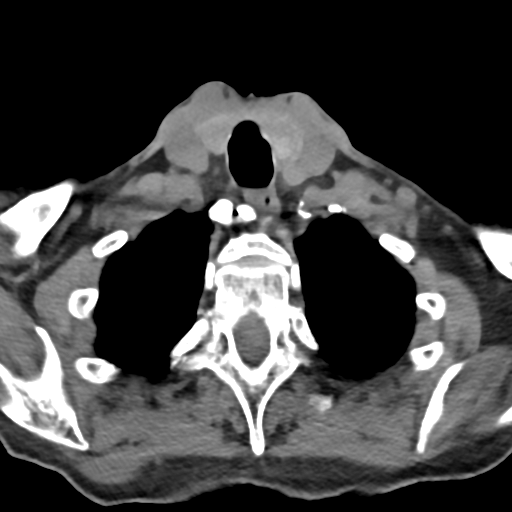
[im 16/79  bone]
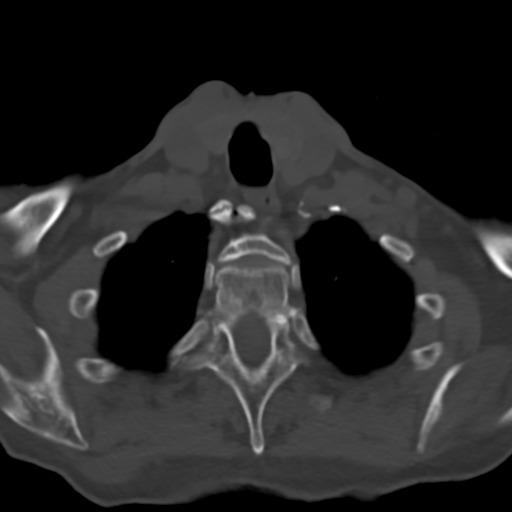
[im 32/79  bone]
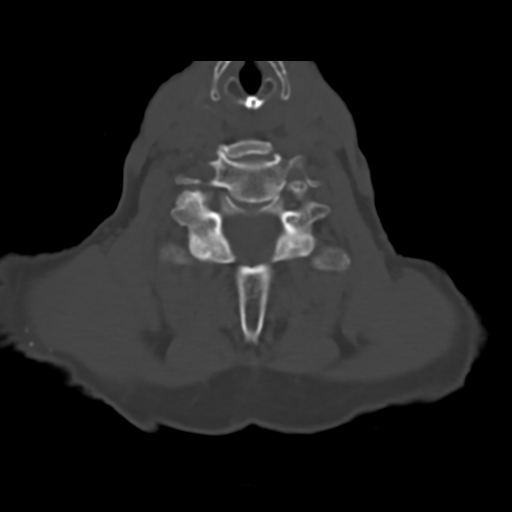
[im 47/79  bone]
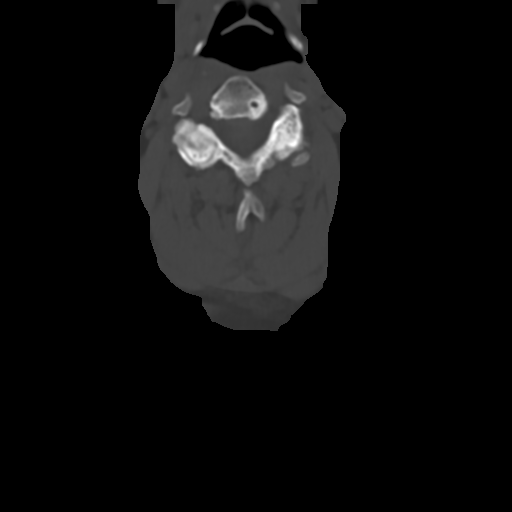
[im 63/79  bone]
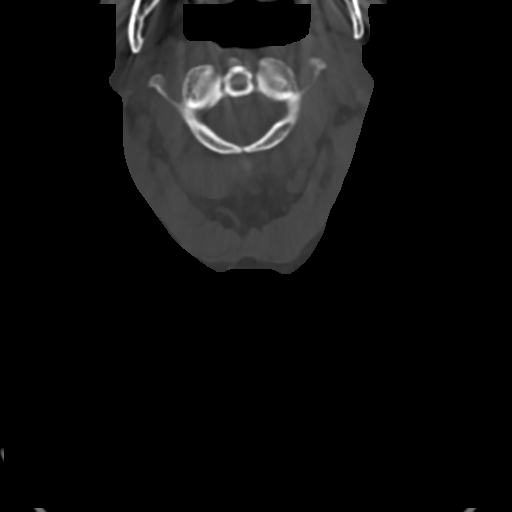

[Series 12: c_spine 2.0 sag bone · sagittal · 0.29mm/px · 4 of 61 slices shown]
[im 13/61  bone]
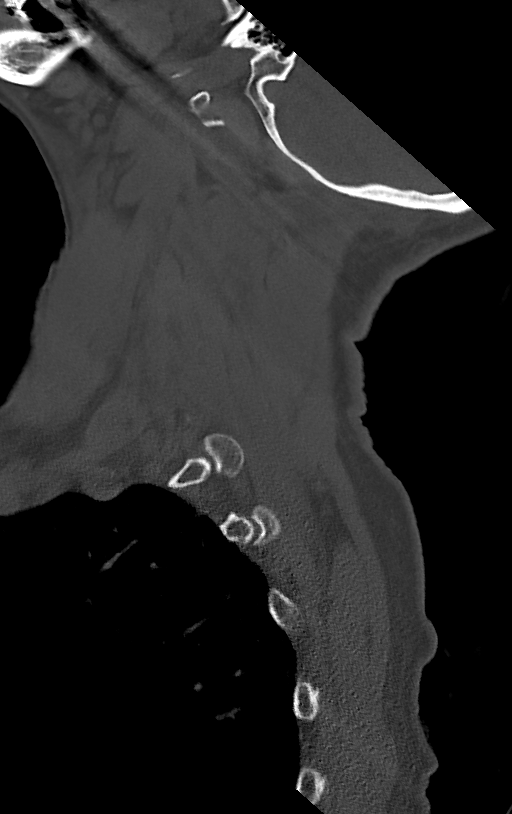
[im 25/61  bone]
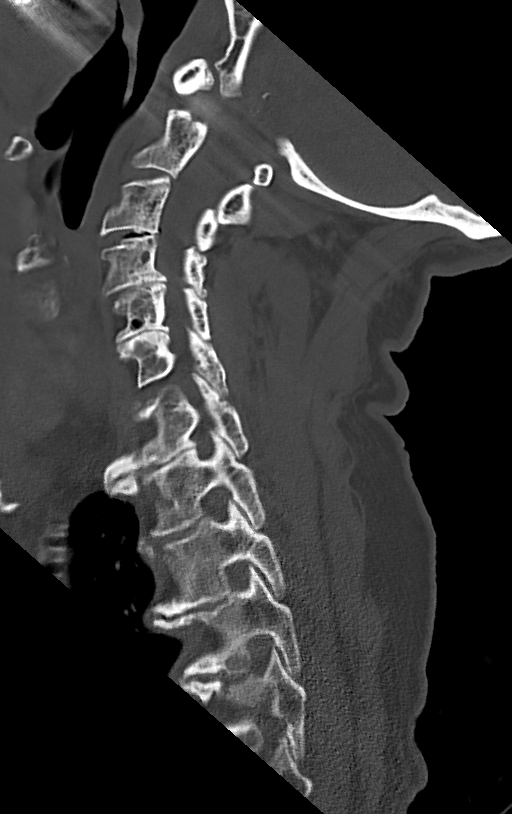
[im 37/61  bone]
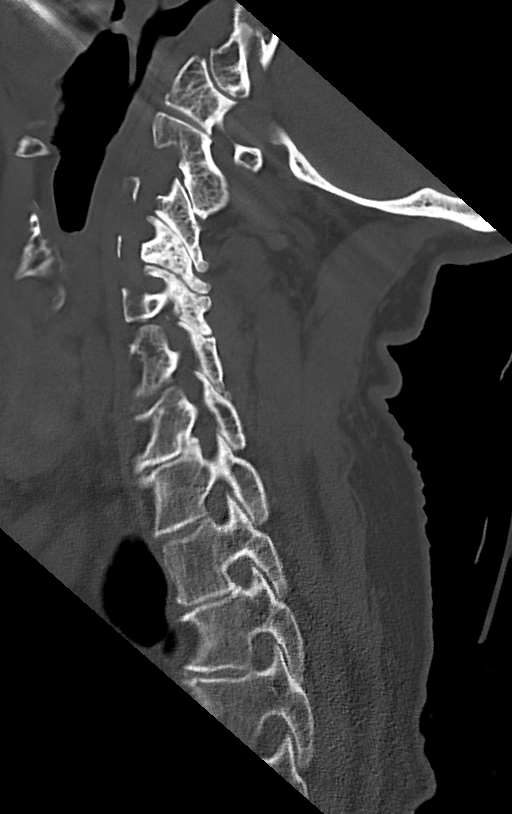
[im 49/61  bone]
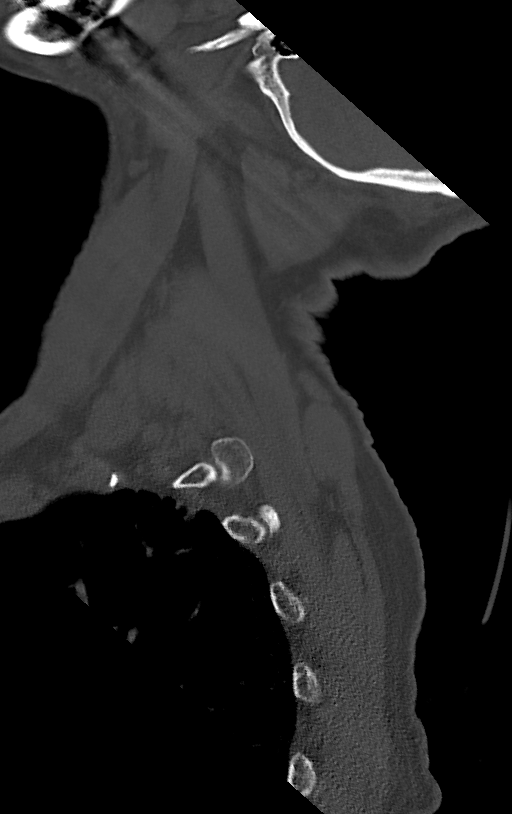

[15 of 33 positions shown; findings below may reference images not displayed]

FINDINGS: CT HEAD FINDINGS

Brain: No evidence of acute infarction, hemorrhage, hydrocephalus,
extra-axial collection or mass lesion/mass effect. Periventricular
white matter hypodensity.

Vascular: No hyperdense vessel or unexpected calcification.

Skull: Normal. Negative for fracture or focal lesion.

Sinuses/Orbits: No acute finding.

Other: None.

CT CERVICAL SPINE FINDINGS

Alignment: Normal.

Skull base and vertebrae: No acute fracture. No primary bone lesion
or focal pathologic process. Incidental note of congenital posterior
nonunion of C1.

Soft tissues and spinal canal: No prevertebral fluid or swelling. No
visible canal hematoma.

Disc levels: Moderate multilevel disc and facet degenerative disease
of the cervical spine.

Upper chest: Negative.

Other: None.
IMPRESSION: 1.  No acute intracranial pathology.

2.  Small-vessel white matter disease.

3.  No fracture or static subluxation of the cervical spine.

4.  Moderate multilevel disc and facet degenerative disease.
